# Patient Record
Sex: Female | Born: 1955 | Race: Black or African American | Hispanic: No | Marital: Single | Smoking: Never smoker
Health system: Southern US, Community
[De-identification: ages and names within clinical notes are randomized; demographics above are authoritative.]

## PROBLEM LIST (undated history)

## (undated) DIAGNOSIS — N6452 Nipple discharge: Secondary | ICD-10-CM

## (undated) DIAGNOSIS — H9319 Tinnitus, unspecified ear: Secondary | ICD-10-CM

## (undated) DIAGNOSIS — J45909 Unspecified asthma, uncomplicated: Secondary | ICD-10-CM

## (undated) DIAGNOSIS — L509 Urticaria, unspecified: Secondary | ICD-10-CM

## (undated) DIAGNOSIS — C50919 Malignant neoplasm of unspecified site of unspecified female breast: Secondary | ICD-10-CM

## (undated) DIAGNOSIS — E079 Disorder of thyroid, unspecified: Secondary | ICD-10-CM

## (undated) DIAGNOSIS — F329 Major depressive disorder, single episode, unspecified: Secondary | ICD-10-CM

## (undated) DIAGNOSIS — G4730 Sleep apnea, unspecified: Secondary | ICD-10-CM

## (undated) DIAGNOSIS — F32A Depression, unspecified: Secondary | ICD-10-CM

## (undated) DIAGNOSIS — F419 Anxiety disorder, unspecified: Secondary | ICD-10-CM

## (undated) DIAGNOSIS — R569 Unspecified convulsions: Secondary | ICD-10-CM

## (undated) DIAGNOSIS — E785 Hyperlipidemia, unspecified: Secondary | ICD-10-CM

## (undated) DIAGNOSIS — K219 Gastro-esophageal reflux disease without esophagitis: Secondary | ICD-10-CM

## (undated) DIAGNOSIS — R51 Headache: Secondary | ICD-10-CM

## (undated) DIAGNOSIS — H269 Unspecified cataract: Secondary | ICD-10-CM

## (undated) DIAGNOSIS — H938X1 Other specified disorders of right ear: Secondary | ICD-10-CM

## (undated) DIAGNOSIS — E039 Hypothyroidism, unspecified: Secondary | ICD-10-CM

## (undated) HISTORY — DX: Gastro-esophageal reflux disease without esophagitis: K21.9

## (undated) HISTORY — DX: Nipple discharge: N64.52

## (undated) HISTORY — DX: Disorder of thyroid, unspecified: E07.9

## (undated) HISTORY — DX: Unspecified cataract: H26.9

## (undated) HISTORY — PX: WISDOM TOOTH EXTRACTION: SHX21

## (undated) HISTORY — DX: Urticaria, unspecified: L50.9

## (undated) HISTORY — PX: DILATION AND CURETTAGE OF UTERUS: SHX78

## (undated) HISTORY — DX: Unspecified convulsions: R56.9

## (undated) HISTORY — PX: BREAST EXCISIONAL BIOPSY: SUR124

## (undated) HISTORY — DX: Headache: R51

## (undated) HISTORY — DX: Hyperlipidemia, unspecified: E78.5

---

## 1996-01-29 HISTORY — PX: TUBAL LIGATION: SHX77

## 1997-10-11 ENCOUNTER — Ambulatory Visit (HOSPITAL_COMMUNITY): Admission: RE | Admit: 1997-10-11 | Discharge: 1997-10-11 | Payer: Self-pay | Admitting: Endocrinology

## 1998-07-24 ENCOUNTER — Other Ambulatory Visit: Admission: RE | Admit: 1998-07-24 | Discharge: 1998-07-24 | Payer: Self-pay | Admitting: Obstetrics and Gynecology

## 1998-07-24 ENCOUNTER — Encounter (INDEPENDENT_AMBULATORY_CARE_PROVIDER_SITE_OTHER): Payer: Self-pay | Admitting: Specialist

## 1999-09-19 ENCOUNTER — Encounter: Admission: RE | Admit: 1999-09-19 | Discharge: 1999-09-19 | Payer: Self-pay | Admitting: Family Medicine

## 1999-09-19 ENCOUNTER — Encounter: Payer: Self-pay | Admitting: Family Medicine

## 2001-04-07 ENCOUNTER — Other Ambulatory Visit: Admission: RE | Admit: 2001-04-07 | Discharge: 2001-04-07 | Payer: Self-pay | Admitting: Obstetrics and Gynecology

## 2001-04-14 ENCOUNTER — Ambulatory Visit (HOSPITAL_COMMUNITY): Admission: RE | Admit: 2001-04-14 | Discharge: 2001-04-14 | Payer: Self-pay | Admitting: Endocrinology

## 2001-04-14 ENCOUNTER — Encounter: Payer: Self-pay | Admitting: Endocrinology

## 2003-03-15 ENCOUNTER — Encounter: Admission: RE | Admit: 2003-03-15 | Discharge: 2003-03-15 | Payer: Self-pay | Admitting: Family Medicine

## 2005-01-28 HISTORY — PX: ANTERIOR AND POSTERIOR REPAIR: SHX1172

## 2005-09-04 ENCOUNTER — Ambulatory Visit (HOSPITAL_COMMUNITY): Admission: RE | Admit: 2005-09-04 | Discharge: 2005-09-04 | Payer: Self-pay | Admitting: Endocrinology

## 2005-10-17 ENCOUNTER — Encounter: Admission: RE | Admit: 2005-10-17 | Discharge: 2005-10-17 | Payer: Self-pay | Admitting: Endocrinology

## 2005-10-17 ENCOUNTER — Other Ambulatory Visit: Admission: RE | Admit: 2005-10-17 | Discharge: 2005-10-17 | Payer: Self-pay | Admitting: Interventional Radiology

## 2005-10-17 ENCOUNTER — Encounter (INDEPENDENT_AMBULATORY_CARE_PROVIDER_SITE_OTHER): Payer: Self-pay | Admitting: Specialist

## 2005-11-29 ENCOUNTER — Other Ambulatory Visit: Admission: RE | Admit: 2005-11-29 | Discharge: 2005-11-29 | Payer: Self-pay | Admitting: Obstetrics and Gynecology

## 2006-09-17 ENCOUNTER — Ambulatory Visit (HOSPITAL_COMMUNITY): Admission: RE | Admit: 2006-09-17 | Discharge: 2006-09-17 | Payer: Self-pay | Admitting: Endocrinology

## 2007-07-21 ENCOUNTER — Ambulatory Visit (HOSPITAL_COMMUNITY): Admission: RE | Admit: 2007-07-21 | Discharge: 2007-07-22 | Payer: Self-pay | Admitting: Obstetrics and Gynecology

## 2008-01-29 HISTORY — PX: TYMPANOSTOMY TUBE PLACEMENT: SHX32

## 2008-05-03 ENCOUNTER — Ambulatory Visit (HOSPITAL_COMMUNITY): Admission: RE | Admit: 2008-05-03 | Discharge: 2008-05-03 | Payer: Self-pay | Admitting: Obstetrics and Gynecology

## 2010-02-18 ENCOUNTER — Encounter: Payer: Self-pay | Admitting: Endocrinology

## 2010-06-12 NOTE — Op Note (Signed)
Angela [name], Angela [name]               ACCOUNT NO.:  [id number]   MEDICAL RECORD NO.:  [id number]          PATIENT TYPE:  AMB   LOCATION:  SDC                           FACILITY:  [facility]   PHYSICIAN:  Janine Limbo, M.D.DATE OF BIRTH:  1956-01-19   DATE OF PROCEDURE:  07/21/2007  DATE OF DISCHARGE:                               OPERATIVE REPORT   PREOPERATIVE DIAGNOSES:  1. 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  2. Pelvic pressure symptoms.  3. Stress urinary incontinence.   POSTOPERATIVE DIAGNOSES:  1. 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  2. Pelvic pressure symptoms.  3. Stress urinary incontinence.   PROCEDURE:  1. Anterior and posterior colporrhaphy.  2. Tension-free vaginal tape using the TVT secure system.  3. Cystoscopy.   SURGEON:  Janine Limbo, MD   FIRST ASSISTANT:  Hal Morales, MD   ANESTHESIA:  General.   DISPOSITION:  Angela [name] is a 55 year old female, para 4-0-1-4, who  presents with the above-mentioned diagnosis.  The patient declined a  hysterectomy.  She understands the indications for her surgical  procedure as well as the alternative treatment options.  She accepts the  risks, but not limited to anesthetic complications, bleeding, infection,  and possible damage to the surrounding organs.  She understands that  there is a risk of mesh erosion and that we will use vaginal estrogen  with hopes of decreasing that risk.  She also understands that there is  a risk of urinary retention.   FINDINGS:  The patient's uterus was upper limits and normal size.  No  adnexal masses were appreciated.  The patient was noted to have a large  cystocele and rectocele with loss of the urethrovesical angle.  Cystoscopy was performed at the end of our procedure and there was no  evidence of damage to the internal bladder mucosa.  Dye easily pass  through both  ureteral orifices.   PROCEDURE IN DETAIL:  The patient was taken to the operating room where  a general anesthetic was given.  The patient's abdomen, perineum, and  vagina were prepped with multiple layers of Betadine.  The patient was  then sterilely draped.  Examination under anesthesia was performed.  A  Foley catheter was placed in the bladder.  The anterior vagina was  injected with a diluted solution of Pitressin, saline, and 0.25%  Marcaine.  The vaginal mucosa was incised.  The underlying fascia was  then separated from the vaginal mucosa to the level of the  urethrovesical angle.  The fascia and the supporting tissue of the  bladder were then reapproximated in the midline using interrupted  sutures of 2-0 chromic and 0 Vicryl.  The urethrovesical angle was  reinforced using 0 Vicryl.  The excess vaginal mucosa was excised.  The  vaginal mucosa was then closed using interrupted sutures of 2-0 Vicryl.  Hemostasis was noted to be adequate.  We then turned our attention to  the posterior colporrhaphy.  The perineal  body and the vaginal mucosa  were then injected in the midline using a diluted solution of Pitressin,  saline, and 0.25% Marcaine.  An incision was made in the perineal body.  The midline of the vagina was then separated from the underlying  supporting tissue of the posterior vagina.  The fascia and the  supporting tissue of the posterior vagina were then reapproximated in  the midline using interrupted sutures of 0 Vicryl, and 2-0 chromic  catgut.  The excess vaginal mucosa was excised.  The vaginal mucosa was  then reapproximated in the midline using a running suture of 2-0 Vicryl.  The perineal body was reinforced using interrupted sutures of 0 Vicryl.  The skin of the perineum was reapproximated in the midline using 2-0  Vicryl.  Sponge, needle, and instrument counts were correct at this  point.  We then turned our attention to the tension free vaginal tape  portion  of the procedure.  We measured 1-2 cm from the ureteral orifice  on the anterior vagina.  We then made a 1.5 cm incision after injecting  the area with Pitressin, saline, and 0.25% Marcaine.  The underlying  vaginal mucosa was then separated from the vagina.  We then inserted the  TVT secure instrument and angled the device in a hammock fashion toward  the ischiopubic ramus.  We tucked the device behind the infeior edge of  the pubic ramus into the obturator internus muscle.  We began on the  patient's right side and an identical procedure was carried out on the  patient's left side.  We checked the tension of the mesh and it was  thought to be appropriate.  The locking springs were released and the  TVT applicators were removed.  Again, the mesh was noted to be in an  appropriate position with appropriate tension.  We then closed the  vaginal mucosa using 2-0 Vicryl and 2-0 chromic.  Sponge, needle, and  instrument counts were noted to be correct.  The Foley catheter was  removed and the cystoscope was inserted in the bladder after reprepping  the urethra.  The patient was given an ampule of indigo carmine dye.  The bladder mucosa was noted to be without harm and no pathology was  noted.  Dye very quickly passed through the normal appearing ureteral  orifices.  Pictures were taken.  The cystoscope was removed.  The  urethra was prepped once again and the Foley catheter was reinserted.  The vagina was then packed with 1/2-inch and 1-inch gauze that was  soaked with estrogen vaginal cream.  The patient was awakened from her  anesthetic without difficulty and then taken to the recovery room in  stable condition.  The estimated blood loss was between 50 and 100 mL.  Sponge, needle, and instrument counts were correct.  The patient  tolerated her procedure very well.  There were no pathology specimens.      Janine Limbo, M.D.  Electronically Signed     AVS/MEDQ  D:  07/21/2007  T:   07/22/2007  Job:  [id number]

## 2010-06-12 NOTE — H&P (Signed)
Angela [name], Angela [name]               ACCOUNT NO.:  [id number]   MEDICAL RECORD NO.:  [id number]          PATIENT TYPE:  AMB   LOCATION:                                FACILITY:  [facility]   PHYSICIAN:  Janine Limbo, M.D.DATE OF BIRTH:  [birth year]   DATE OF ADMISSION:  07/21/2007  DATE OF DISCHARGE:                              HISTORY & PHYSICAL   DATE OF SURGERY:  July 21, 2007.   HISTORY OF PRESENT ILLNESS:  Ms. Trefry is a 55 year old female, para 4-  0-1-4, who presents for an anterior and posterior colporrhaphy.  The  patient has been followed at the [facility]  Gy[facility]cology division of [facility].  The patient  complains of pelvic pressure symptoms like something is falling out.  She is noted to have a cystocele and a rectocele with loss of the  urethrovesical angle.  Her most recent Pap smear is within normal  limits.   OBSTETRICAL HISTORY:  The patient has had 4 term vaginal deliveries and  1 first trimester miscarriage.   PAST MEDICAL HISTORY:  The patient has a history of thyroid disease.  She also has a history of asthma.  She had her wisdom teeth removed.  She had a D&C associated with her miscarriage.  She had a seizure  disorder as a child, but has not had seizures since age 7.   DRUG ALLERGIES:  No known drug allergies.   SOCIAL HISTORY:  The patient denies cigarette use and recreational drug  use.  She does drink alcohol socially.  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   CURRENT MEDICATIONS:  Multivitamins, Synthroid, Alavert, Nasonex,  albuterol inhaler, and Asmanex.   REVIEW OF SYSTEMS:  Please see history of present illness.   FAMILY HISTORY:  Noncontributory.   PHYSICAL EXAMINATION:  VITAL SIGNS:  Weight is 216 pounds.  Height is 5  feet 5 inches.  HEENT:  Within normal limits.  CHEST:  Clear.  HEART:  Regular rate and rhythm.  BREASTS:  Without masses.  ABDOMEN:  Nontender.  EXTREMITIES:  Grossly normal.  NEUROLOGIC:  Grossly normal.  PELVIC:  External genitalia is normal.  Vagina shows a cystocele and a  rectocele with loss of urethrovesical angle.  The cervix is nontender.  The uterus is upper-limit normal size and normal in contour.  Adnexa, no  masses.  Rectovaginal exam confirms.   ASSESSMENT:  Pelvic relaxation with pelvic pressure symptoms.   PLAN:  The patient has elected to proceed with an anterior and posterior  colporrhaphies.  I discussed the benefits of a hysterectomy as well as  tension-free vaginal tape placement.  The patient understands the  benefits and risks associated with the alternative procedures.  She  declines hysterectomy and TVT at this time.  She wishes to proceed only  with an anterior and posterior colporrhaphy.  Specifically, speaking,  the patient understands and accepts the risk of anesthetic  complications, bleeding, infections, and possible damage to the  surrounding organs.      Janine Limbo, M.D.  Electronically Signed     AVS/MEDQ  D:  07/14/2007  T:  07/15/2007  Job:  [id number]

## 2010-06-15 NOTE — Discharge Summary (Signed)
[name], [name]               ACCOUNT NO.:  [id number]   MEDICAL RECORD NO.:  [id number]          PATIENT TYPE:  OIB   LOCATION:  9310                          FACILITY:  [facility]   PHYSICIAN:  Janine Limbo, M.D.DATE OF BIRTH:  Dec 05, 1955   DATE OF ADMISSION:  07/21/2007  DATE OF DISCHARGE:  07/22/2007                               DISCHARGE SUMMARY   ADMISSION DIAGNOSES:  1. Pelvic relaxation with a cystocele, rectocele, and loss of the      urethrovesical angle.  2. Pelvic pressure symptoms.  3. Stress urinary incontinence.  4. Obesity (weight is 216 pounds, height 5 feet 5 inches).  5. Hypothyroidism.  6. Asthma.   POSTOPERATIVE DIAGNOSES:  1. Pelvic relaxation with a cystocele, rectocele, and loss of the      urethrovesical angle.  2. Pelvic pressure symptoms.  3. Stress urinary incontinence.  4. Obesity (weight is 216 pounds, height 5 feet 5 inches).  5. Hypothyroidism.  6. Asthma.   PROCEDURES THIS ADMISSION:  1. Anterior and posterior colporrhaphies.  2. Placement of a tension-free vaginal tape using the TVT secure      system.  3. Cystoscopy.   HISTORY OF PRESENT ILLNESS:  Angela [name] is a 55 year old female, para 4-  0-1-4, who presents with the above-mentioned diagnoses.  She wishes to  have surgical management.  Please see her dictated history and physical  exam for details.   ADMISSION PHYSICAL EXAM:  The patient was noted to have an upper limits  normal size uterus.  She was noted to have a large cystocele and  rectocele with loss of the urethrovesical angle.  No enterocele was  present.   HOSPITAL COURSE:  On the day of admission, the patient was taken to the  operating room.  An anterior and posterior colporrhaphy was performed  without difficulty.  We then performed a TVT secure procedure using the  Hammock technique.  The patient tolerated her procedure well.  Operative  findings included a large cystocele and rectocele with loss of the  urethrovesical angle.  We performed cystoscopy and there was no evidence  of damage to the bladder. The patient's postoperative course was  uncomplicated.  She quickly tolerated a regular diet.  She remained  afebrile throughout her postoperative stay.  Her postoperative  hemoglobin was 11.6 (preoperative hemoglobin 14.1).  The patient had her  Foley catheter removed and she was able to void without difficulty.  She  was discharged to home doing quite well.   DISCHARGE MEDICATIONS:  1. Tylenol 1 or 2 every 6 hours as needed for mild-to-moderate pain.  2. Tylenol No. 3, 1 or 2 tablets every 4 hours as needed for severe      pain.  3. Zofran 1 tablet every 4 hours as needed for nausea.  4. Premarin vaginal cream 1/4 applicator per vagina each night for 2      weeks then every other night for 4 weeks.  5. Iron 325 mg 1 tablet twice each day for 6 weeks.   The patient will continue her preoperative medications.   DISCHARGE INSTRUCTIONS:  1.  The patient will return to see Dr. Stefano Gaul in 6 weeks.  2. She will refrain from driving for more than 2 weeks, heavy lifting      for 4 weeks, and intercourse for 6 weeks.  3. She will call for questions or concerns.  4. She will call for any difficulty passing her urine.   PATHOLOGY REPORT:  No specimens were sent for pathology.      Janine Limbo, M.D.  Electronically Signed     AVS/MEDQ  D:  07/23/2007  T:  07/24/2007  Job:  [id number]

## 2010-09-14 ENCOUNTER — Other Ambulatory Visit: Payer: Self-pay | Admitting: Obstetrics and Gynecology

## 2010-09-14 DIAGNOSIS — N6452 Nipple discharge: Secondary | ICD-10-CM

## 2010-09-20 ENCOUNTER — Ambulatory Visit
Admission: RE | Admit: 2010-09-20 | Discharge: 2010-09-20 | Disposition: A | Discharge status: Home / Self Care | Payer: 59 | Source: Ambulatory Visit | Attending: Obstetrics and Gynecology | Admitting: Obstetrics and Gynecology

## 2010-09-20 DIAGNOSIS — N6452 Nipple discharge: Secondary | ICD-10-CM

## 2010-10-25 LAB — URINALYSIS, ROUTINE W REFLEX MICROSCOPIC
Bilirubin Urine: NEGATIVE
Glucose, UA: NEGATIVE
Hgb urine dipstick: NEGATIVE
Ketones, ur: NEGATIVE
Nitrite: NEGATIVE
Protein, ur: NEGATIVE
Specific Gravity, Urine: 1.02
Urobilinogen, UA: 0.2
pH: 5

## 2010-10-25 LAB — BASIC METABOLIC PANEL
BUN: 7
CO2: 25
Calcium: 8.5
Chloride: 106
Creatinine, Ser: 0.82
GFR calc Af Amer: >60
GFR calc non Af Amer: >60
Glucose, Bld: 92
Potassium: 3.4 — ABNORMAL LOW
Sodium: 140

## 2010-10-25 LAB — CBC
HCT: 35.1 — ABNORMAL LOW
HCT: 42.9
Hemoglobin: 11.6 — ABNORMAL LOW
Hemoglobin: 14.1
MCHC: 32.9
MCHC: 33.1
MCV: 77.2 — ABNORMAL LOW
MCV: 77.5 — ABNORMAL LOW
Platelets: 208
Platelets: 257
RBC: 4.53
RBC: 5.55 — ABNORMAL HIGH
RDW: 15.4
RDW: 15.6 — ABNORMAL HIGH
WBC: 7.3
WBC: 8.5

## 2010-10-25 LAB — NO BLOOD PRODUCTS

## 2010-11-06 ENCOUNTER — Encounter (INDEPENDENT_AMBULATORY_CARE_PROVIDER_SITE_OTHER): Payer: Self-pay | Admitting: General Surgery

## 2010-11-06 ENCOUNTER — Encounter (INDEPENDENT_AMBULATORY_CARE_PROVIDER_SITE_OTHER): Payer: Self-pay

## 2010-11-06 ENCOUNTER — Ambulatory Visit (INDEPENDENT_AMBULATORY_CARE_PROVIDER_SITE_OTHER): Payer: 59 | Admitting: General Surgery

## 2010-11-06 VITALS — BP 132/96 | HR 60 | Temp 98.0°F | Resp 12 | Ht 64.0 in | Wt 219.6 lb

## 2010-11-06 DIAGNOSIS — N6452 Nipple discharge: Secondary | ICD-10-CM | POA: Insufficient documentation

## 2010-11-06 DIAGNOSIS — N6459 Other signs and symptoms in breast: Secondary | ICD-10-CM

## 2010-11-06 NOTE — Progress Notes (Signed)
Chief Complaint  Patient presents with  . Other    new pt- eval rt nipple discharge    HPI Angela Angela [name] is a 55 y.Angela [name]. female.     HPI  The patient comes in with a chief complaint of discharge and itching in her right breast. She had a recent mammogram which did not disclose any underlying malignancy. She continues to have problems of discharge and itching and she comes in now for further evaluation.  She does not have a strong family history of any type of breast disease or breast cancer although she had a maternal aunt who was diagnosed and died of breast  Past Medical History  Diagnosis Date  . Breast discharge   . Asthma   . Hyperlipidemia     Past Surgical History  Procedure Date  . Anterior and posterior repair 2007  . Tubal ligation 1998    Family History  Problem Relation Age of Onset  . Cancer Father     throat  . Cancer Maternal Aunt     breast    Social History History  Substance Use Topics  . Smoking status: Never Smoker   . Smokeless tobacco: Not on file  . Alcohol Use: No    Allergies  Allergen Reactions  . Aspirin   . Ibuprofen     Current Outpatient Prescriptions  Medication Sig Dispense Refill  . cetirizine (ZYRTEC) 10 MG tablet Take 10 mg by mouth daily.        . clobetasol (TEMOVATE) 0.05 % cream Ad lib.      Marland Kitchen LEVOXYL 112 MCG tablet daily.      . mometasone (ASMANEX) 220 MCG/INH inhaler Inhale 2 puffs into the lungs daily.        . Olopatadine HCl (PATANASE) 0.6 % SOLN Place into the nose daily.          Review of Systems Review of Systems  Constitutional: Negative.   HENT: Negative.   Respiratory: Negative.   Cardiovascular: Negative.   Gastrointestinal: Negative.     Blood pressure 132/96, pulse 60, temperature 98 F (36.7 C), temperature source Temporal, resp. rate 12, height 5\' 4"  (1.626 m), weight 219 lb 9.6 oz (99.61 kg).  Physical Exam Physical Exam  Constitutional: She appears well-developed and well-nourished.    HENT:  Head: Normocephalic and atraumatic.  Eyes: EOM are normal. Pupils are equal, round, and reactive to light.  Neck: Normal range of motion. Neck supple.  Pulmonary/Chest: She exhibits no mass. Right breast exhibits inverted nipple (right nipple only), nipple discharge and skin change (nipple only). Right breast exhibits no mass and no tenderness. Left breast exhibits no inverted nipple, no mass, no nipple discharge, no skin change and no tenderness. Breast asymmetry: slightly large rleft breast.      Data Reviewed The results from the patient's mammogram and apparent cytology are on the chart. There are atypical cells from the cytology.  Assessment    Atypical cells from an excoriated and indented right nipple with chronic discharge the possibility of Paget's disease of the breast has to be entertained. This is in spite of the patient having a normal mammogram.   Plan    The plan is to get a MRI of the right breast rule out an occult malignancy. Once the results back we will likely need to go ahead and do a biopsy anyway       Angela Angela [name],Angela Angela [name] 11/06/2010, 2:42 PM

## 2010-11-07 ENCOUNTER — Other Ambulatory Visit (INDEPENDENT_AMBULATORY_CARE_PROVIDER_SITE_OTHER): Payer: Self-pay

## 2010-11-07 DIAGNOSIS — N6452 Nipple discharge: Secondary | ICD-10-CM

## 2010-11-07 DIAGNOSIS — N6459 Other signs and symptoms in breast: Secondary | ICD-10-CM

## 2010-11-16 ENCOUNTER — Other Ambulatory Visit: Payer: 59

## 2010-12-04 ENCOUNTER — Encounter (INDEPENDENT_AMBULATORY_CARE_PROVIDER_SITE_OTHER): Payer: 59 | Admitting: General Surgery

## 2010-12-18 ENCOUNTER — Ambulatory Visit (INDEPENDENT_AMBULATORY_CARE_PROVIDER_SITE_OTHER): Payer: 59 | Admitting: General Surgery

## 2010-12-18 ENCOUNTER — Encounter (INDEPENDENT_AMBULATORY_CARE_PROVIDER_SITE_OTHER): Payer: Self-pay | Admitting: General Surgery

## 2010-12-18 VITALS — BP 148/104 | HR 68 | Temp 97.4°F | Resp 16 | Ht 64.0 in | Wt 221.6 lb

## 2010-12-18 DIAGNOSIS — N6459 Other signs and symptoms in breast: Secondary | ICD-10-CM

## 2010-12-18 DIAGNOSIS — N6452 Nipple discharge: Secondary | ICD-10-CM | POA: Insufficient documentation

## 2010-12-18 NOTE — Progress Notes (Signed)
HPI The patient continues to have drainage from her right nipple. It is intermittent but still present and there is itching.  PE On exam exam she still has some changes in the right nipple that could be indicative of Paget's disease  Studiy review There are no current studies to review.  Assessment Because the patient has continued drainage from her right nipple and there were atypical cells noted on the fluid drained by Dr. Stefano Gaul I do believe that an MRI of the breast as indicated . I am making an attempt to contact the insurance approval agency so that we may get the MRI approved for the patient. This is not done them perhaps an open biopsy of the nipple will be necessary.  Plan Contact insurance or latency in order to seat approval for an MRI of the right breast. With the results from that and perhaps we may or may not require biopsy of her right breast  Addendum  After talking with the patient and I was able to contact the insurance approval agency and they went ahead and improved with MRI of the right breast. We will go ahead and get this scheduled and I will see the patient again in the future to discuss with her the results.

## 2011-01-03 ENCOUNTER — Other Ambulatory Visit: Payer: 59

## 2011-01-04 ENCOUNTER — Ambulatory Visit
Admission: RE | Admit: 2011-01-04 | Discharge: 2011-01-04 | Disposition: A | Discharge status: Home / Self Care | Payer: 59 | Source: Ambulatory Visit | Attending: General Surgery | Admitting: General Surgery

## 2011-01-04 DIAGNOSIS — N6452 Nipple discharge: Secondary | ICD-10-CM

## 2011-01-04 DIAGNOSIS — N6459 Other signs and symptoms in breast: Secondary | ICD-10-CM

## 2011-01-04 MED ORDER — GADOBENATE DIMEGLUMINE 529 MG/ML IV SOLN
20.0000 mL | Freq: Once | INTRAVENOUS | Status: AC | PRN
  Administered 2011-01-04: 20 mL via INTRAVENOUS

## 2011-01-07 ENCOUNTER — Other Ambulatory Visit (INDEPENDENT_AMBULATORY_CARE_PROVIDER_SITE_OTHER): Payer: Self-pay | Admitting: General Surgery

## 2011-01-07 DIAGNOSIS — R928 Other abnormal and inconclusive findings on diagnostic imaging of breast: Secondary | ICD-10-CM

## 2011-01-08 ENCOUNTER — Ambulatory Visit
Admission: RE | Admit: 2011-01-08 | Discharge: 2011-01-08 | Disposition: A | Discharge status: Home / Self Care | Payer: 59 | Source: Ambulatory Visit | Attending: General Surgery | Admitting: General Surgery

## 2011-01-08 DIAGNOSIS — R928 Other abnormal and inconclusive findings on diagnostic imaging of breast: Secondary | ICD-10-CM

## 2011-01-15 ENCOUNTER — Ambulatory Visit (INDEPENDENT_AMBULATORY_CARE_PROVIDER_SITE_OTHER): Payer: 59 | Admitting: General Surgery

## 2011-01-15 ENCOUNTER — Encounter (INDEPENDENT_AMBULATORY_CARE_PROVIDER_SITE_OTHER): Payer: Self-pay | Admitting: General Surgery

## 2011-01-15 VITALS — BP 132/84 | HR 64 | Temp 97.3°F | Resp 18 | Ht 64.0 in | Wt 218.1 lb

## 2011-01-15 DIAGNOSIS — N6452 Nipple discharge: Secondary | ICD-10-CM

## 2011-01-15 DIAGNOSIS — N6459 Other signs and symptoms in breast: Secondary | ICD-10-CM

## 2011-01-15 NOTE — Progress Notes (Signed)
HPI The patient underwent an MRI of both her breasts. It was no significant abnormality found. She also underwent a followup ultrasound of her left breast which did not show any significant abnormality.  Drainage and the patient has had his been from the right breast and there was no abnormality demonstrated there on ultrasound and or MRI.  PE On examination the patient continues to have drainage from the right clefted nipple. Previous specimens sent by Dr. Kirkland Hun demonstrated some abnormal cells. On examination today I could not milk any fluid however based on previous several cytology and the invaginated appearance of the right nipple a biopsy is being recommended.  Studiy review I have reviewed the patient's MRI studies and her ultrasounds of her breast.  Assessment Abnormal cells from a cleft with right nipple with drainage.  Plan In spite of the melanoma were non-abnormal MRI and the ultrasound I believe that a biopsy is warranted based on the abnormal cells from her right nipple. We will go ahead and schedule her for right nipple exploration and biopsy.

## 2011-01-17 ENCOUNTER — Encounter (HOSPITAL_BASED_OUTPATIENT_CLINIC_OR_DEPARTMENT_OTHER): Payer: Self-pay | Admitting: *Deleted

## 2011-01-17 NOTE — Progress Notes (Signed)
To bring cpap-and use post op-mild osa-does not always use, but will now.

## 2011-01-18 ENCOUNTER — Ambulatory Visit (HOSPITAL_BASED_OUTPATIENT_CLINIC_OR_DEPARTMENT_OTHER): Payer: 59 | Admitting: Anesthesiology

## 2011-01-18 ENCOUNTER — Ambulatory Visit (HOSPITAL_BASED_OUTPATIENT_CLINIC_OR_DEPARTMENT_OTHER)
Admission: RE | Admit: 2011-01-18 | Discharge: 2011-01-18 | Disposition: A | Discharge status: Home / Self Care | Payer: 59 | Source: Ambulatory Visit | Attending: General Surgery | Admitting: General Surgery

## 2011-01-18 ENCOUNTER — Encounter (HOSPITAL_BASED_OUTPATIENT_CLINIC_OR_DEPARTMENT_OTHER)
Admission: RE | Disposition: A | Discharge status: Home / Self Care | Payer: Self-pay | Source: Ambulatory Visit | Attending: General Surgery

## 2011-01-18 ENCOUNTER — Encounter (HOSPITAL_BASED_OUTPATIENT_CLINIC_OR_DEPARTMENT_OTHER): Payer: Self-pay | Admitting: *Deleted

## 2011-01-18 ENCOUNTER — Encounter (HOSPITAL_BASED_OUTPATIENT_CLINIC_OR_DEPARTMENT_OTHER): Payer: Self-pay | Admitting: Anesthesiology

## 2011-01-18 ENCOUNTER — Other Ambulatory Visit (INDEPENDENT_AMBULATORY_CARE_PROVIDER_SITE_OTHER): Payer: Self-pay | Admitting: General Surgery

## 2011-01-18 ENCOUNTER — Encounter (HOSPITAL_BASED_OUTPATIENT_CLINIC_OR_DEPARTMENT_OTHER): Payer: Self-pay | Admitting: Certified Registered"

## 2011-01-18 DIAGNOSIS — J45909 Unspecified asthma, uncomplicated: Secondary | ICD-10-CM | POA: Insufficient documentation

## 2011-01-18 DIAGNOSIS — N6452 Nipple discharge: Secondary | ICD-10-CM

## 2011-01-18 DIAGNOSIS — Z01812 Encounter for preprocedural laboratory examination: Secondary | ICD-10-CM | POA: Insufficient documentation

## 2011-01-18 DIAGNOSIS — N6049 Mammary duct ectasia of unspecified breast: Secondary | ICD-10-CM | POA: Insufficient documentation

## 2011-01-18 DIAGNOSIS — N6459 Other signs and symptoms in breast: Secondary | ICD-10-CM

## 2011-01-18 HISTORY — PX: BREAST SURGERY: SHX581

## 2011-01-18 HISTORY — PX: BREAST DUCTAL SYSTEM EXCISION: SHX5242

## 2011-01-18 LAB — POCT HEMOGLOBIN-HEMACUE: Hemoglobin: 13.5 g/dL (ref 12.0–15.0)

## 2011-01-18 SURGERY — EXCISION DUCTAL SYSTEM BREAST
Anesthesia: Monitor Anesthesia Care | Site: Breast | Laterality: Right | Wound class: Clean

## 2011-01-18 MED ORDER — MIDAZOLAM HCL 5 MG/5ML IJ SOLN
INTRAMUSCULAR | Status: DC | PRN
  Administered 2011-01-18: 2 mg via INTRAVENOUS

## 2011-01-18 MED ORDER — FENTANYL CITRATE 0.05 MG/ML IJ SOLN
INTRAMUSCULAR | Status: DC | PRN
  Administered 2011-01-18: 100 ug via INTRAVENOUS

## 2011-01-18 MED ORDER — LIDOCAINE HCL (CARDIAC) 20 MG/ML IV SOLN
INTRAVENOUS | Status: DC | PRN
  Administered 2011-01-18: 45 mg via INTRAVENOUS

## 2011-01-18 MED ORDER — PROMETHAZINE HCL 25 MG/ML IJ SOLN: 6.2500 mg | INTRAMUSCULAR | Status: DC | PRN

## 2011-01-18 MED ORDER — LACTATED RINGERS IV SOLN
INTRAVENOUS | Status: DC
  Administered 2011-01-18: 08:00:00 via INTRAVENOUS

## 2011-01-18 MED ORDER — PROPOFOL 10 MG/ML IV EMUL
INTRAVENOUS | Status: DC | PRN
  Administered 2011-01-18: 40 ug/kg/min via INTRAVENOUS

## 2011-01-18 MED ORDER — CEFAZOLIN SODIUM 1-5 GM-% IV SOLN: 1.0000 g | INTRAVENOUS | Status: DC

## 2011-01-18 MED ORDER — LIDOCAINE HCL (PF) 1 % IJ SOLN
INTRAMUSCULAR | Status: DC | PRN
  Administered 2011-01-18: 10 mL

## 2011-01-18 MED ORDER — HYDROCODONE-ACETAMINOPHEN 5-500 MG PO TABS: 1.0000 | ORAL_TABLET | Freq: Four times a day (QID) | ORAL | Status: AC | PRN

## 2011-01-18 MED ORDER — CEFAZOLIN SODIUM-DEXTROSE 2-3 GM-% IV SOLR
2.0000 g | INTRAVENOUS | Status: AC
  Administered 2011-01-18: 2 g via INTRAVENOUS

## 2011-01-18 MED ORDER — ONDANSETRON HCL 4 MG/2ML IJ SOLN
INTRAMUSCULAR | Status: DC | PRN
  Administered 2011-01-18: 4 mg via INTRAVENOUS

## 2011-01-18 MED ORDER — FENTANYL CITRATE 0.05 MG/ML IJ SOLN: 25.0000 ug | INTRAMUSCULAR | Status: DC | PRN

## 2011-01-18 MED ORDER — MEPERIDINE HCL 25 MG/ML IJ SOLN
6.2500 mg | INTRAMUSCULAR | Status: DC | PRN
Start: 2011-01-18 — End: 2011-01-18

## 2011-01-18 SURGICAL SUPPLY — 52 items
BLADE SURG 10 STRL SS (BLADE) ×2 IMPLANT
BLADE SURG 15 STRL LF DISP TIS (BLADE) ×1 IMPLANT
BLADE SURG 15 STRL SS (BLADE) ×1
CANISTER SUCTION 1200CC (MISCELLANEOUS) IMPLANT
CHLORAPREP W/TINT 26ML (MISCELLANEOUS) ×2 IMPLANT
CLEANER CAUTERY TIP 5X5 PAD (MISCELLANEOUS) ×2 IMPLANT
CLIP TI MEDIUM 6 (CLIP) IMPLANT
CLOTH BEACON ORANGE TIMEOUT ST (SAFETY) ×2 IMPLANT
COVER MAYO STAND STRL (DRAPES) ×2 IMPLANT
COVER TABLE BACK 60X90 (DRAPES) ×2 IMPLANT
DECANTER SPIKE VIAL GLASS SM (MISCELLANEOUS) IMPLANT
DERMABOND ADVANCED (GAUZE/BANDAGES/DRESSINGS) ×1
DERMABOND ADVANCED .7 DNX12 (GAUZE/BANDAGES/DRESSINGS) ×1 IMPLANT
DEVICE DUBIN W/COMP PLATE 8390 (MISCELLANEOUS) IMPLANT
DRAPE LAPAROTOMY TRNSV 102X78 (DRAPE) IMPLANT
DRAPE PED LAPAROTOMY (DRAPES) ×2 IMPLANT
DRAPE UTILITY 15X26 W/TAPE STR (DRAPE) ×2 IMPLANT
DRSG TEGADERM 4X4.75 (GAUZE/BANDAGES/DRESSINGS) ×2 IMPLANT
ELECT REM PT RETURN 9FT ADLT (ELECTROSURGICAL) ×2
ELECTRODE REM PT RTRN 9FT ADLT (ELECTROSURGICAL) ×1 IMPLANT
GAUZE XEROFORM 1X8 LF (GAUZE/BANDAGES/DRESSINGS) ×2 IMPLANT
GLOVE BIO SURGEON STRL SZ7 (GLOVE) ×2 IMPLANT
GLOVE BIOGEL PI IND STRL 8 (GLOVE) ×1 IMPLANT
GLOVE BIOGEL PI INDICATOR 8 (GLOVE) ×1
GLOVE ECLIPSE 7.5 STRL STRAW (GLOVE) ×4 IMPLANT
GOWN PREVENTION PLUS XLARGE (GOWN DISPOSABLE) ×4 IMPLANT
GOWN PREVENTION PLUS XXLARGE (GOWN DISPOSABLE) ×2 IMPLANT
NEEDLE 27GAX1X1/2 (NEEDLE) ×2 IMPLANT
NEEDLE HYPO 22GX1.5 SAFETY (NEEDLE) IMPLANT
NS IRRIG 1000ML POUR BTL (IV SOLUTION) ×2 IMPLANT
PACK BASIN DAY SURGERY FS (CUSTOM PROCEDURE TRAY) ×2 IMPLANT
PAD CLEANER CAUTERY TIP 5X5 (MISCELLANEOUS) ×2
PENCIL BUTTON HOLSTER BLD 10FT (ELECTRODE) ×2 IMPLANT
SLEEVE SCD COMPRESS KNEE MED (MISCELLANEOUS) IMPLANT
SPONGE LAP 4X18 X RAY DECT (DISPOSABLE) ×2 IMPLANT
STRIP CLOSURE SKIN 1/4X4 (GAUZE/BANDAGES/DRESSINGS) IMPLANT
SUCTION FRAZIER TIP 10 FR DISP (SUCTIONS) IMPLANT
SUT MON AB 4-0 PC3 18 (SUTURE) IMPLANT
SUT MON AB 5-0 PS2 18 (SUTURE) ×2 IMPLANT
SUT PROLENE 4 0 PS 2 18 (SUTURE) IMPLANT
SUT VIC AB 3-0 54X BRD REEL (SUTURE) IMPLANT
SUT VIC AB 3-0 BRD 54 (SUTURE)
SUT VIC AB 3-0 SH 27 (SUTURE)
SUT VIC AB 3-0 SH 27X BRD (SUTURE) IMPLANT
SUT VIC AB 4-0 SH 27 (SUTURE) ×1
SUT VIC AB 4-0 SH 27XANBCTRL (SUTURE) ×1 IMPLANT
SYR BULB 3OZ (MISCELLANEOUS) IMPLANT
SYR CONTROL 10ML LL (SYRINGE) ×2 IMPLANT
TOWEL OR 17X24 6PK STRL BLUE (TOWEL DISPOSABLE) ×2 IMPLANT
TUBE CONNECTING 20X1/4 (TUBING) IMPLANT
WATER STERILE IRR 1000ML POUR (IV SOLUTION) IMPLANT
YANKAUER SUCT BULB TIP NO VENT (SUCTIONS) IMPLANT

## 2011-01-18 NOTE — Anesthesia Preprocedure Evaluation (Signed)
Anesthesia Evaluation  Patient identified by MRN, date of birth, ID band Patient awake    Reviewed: Allergy & Precautions, H&P , NPO status , Patient's Chart, lab work & pertinent test results  Airway Mallampati: II TM Distance: >3 FB Neck ROM: Full    Dental No notable dental hx. (+) Teeth Intact   Pulmonary asthma ,  clear to auscultation  Pulmonary exam normal       Cardiovascular neg cardio ROS Regular Normal    Neuro/Psych Negative Neurological ROS  Negative Psych ROS   GI/Hepatic Neg liver ROS, Controlled,  Endo/Other  Negative Endocrine ROS  Renal/GU negative Renal ROS  Genitourinary negative   Musculoskeletal   Abdominal (+) obese,   Peds  Hematology negative hematology ROS (+)   Anesthesia Other Findings   Reproductive/Obstetrics negative OB ROS                           Anesthesia Physical Anesthesia Plan  ASA: II  Anesthesia Plan: MAC   Post-op Pain Management:    Induction: Intravenous  Airway Management Planned: Simple Face Mask  Additional Equipment:   Intra-op Plan:   Post-operative Plan:   Informed Consent: I have reviewed the patients History and Physical, chart, labs and discussed the procedure including the risks, benefits and alternatives for the proposed anesthesia with the patient or authorized representative who has indicated his/her understanding and acceptance.     Plan Discussed with: CRNA and Surgeon  Anesthesia Plan Comments:         Anesthesia Quick Evaluation

## 2011-01-18 NOTE — Anesthesia Postprocedure Evaluation (Signed)
  Anesthesia Post-op Note  Patient: Angela [name]  Procedure(s) Performed:  EXCISION DUCTAL SYSTEM BREAST  Patient Location: PACU  Anesthesia Type: MAC  Level of Consciousness: awake and alert   Airway and Oxygen Therapy: Patient Spontanous Breathing  Post-op Pain: mild  Post-op Assessment: Post-op Vital signs reviewed, Patient's Cardiovascular Status Stable and Respiratory Function Stable  Post-op Vital Signs: Reviewed and stable  Complications: No apparent anesthesia complications

## 2011-01-18 NOTE — Anesthesia Procedure Notes (Addendum)
Procedure Name: MAC Date/Time: 01/18/2011 9:13 AM Performed by: Radford Pax Pre-anesthesia Checklist: Patient identified, Timeout performed, Emergency Drugs available, Suction available and Patient being monitored Patient Re-evaluated:Patient Re-evaluated prior to inductionOxygen Delivery Method: Simple face mask   Date/Time: 01/18/2011 9:13 AM Performed by: Radford Pax Placement Confirmation: positive ETCO2 and CO2 detector

## 2011-01-18 NOTE — H&P (Signed)
HISTORY AND PHYSICAL EXAMINATION  Chief Complaint   Patient presents with   .  Other     new pt- eval rt nipple discharge   HPI  Angela [name] is a 55 y.o. female.  HPI  The patient comes in with a chief complaint of discharge and itching in her right breast. She had a recent mammogram which did not disclose any underlying malignancy. She continues to have problems of discharge and itching and she comes in now for further evaluation.  She does not have a strong family history of any type of breast disease or breast cancer although she had a maternal aunt who was diagnosed and died of breast  Past Medical History   Diagnosis  Date   .  Breast discharge    .  Asthma    .  Hyperlipidemia     Past Surgical History   Procedure  Date   .  Anterior and posterior repair  2007   .  Tubal ligation  1998    Family History   Problem  Relation  Age of Onset   .  Cancer  Father       throat    .  Cancer  Maternal Aunt       breast   Social History  History   Substance Use Topics   .  Smoking status:  Never Smoker   .  Smokeless tobacco:  Not on file   .  Alcohol Use:  No    Allergies   Allergen  Reactions   .  Aspirin    .  Ibuprofen     Current Outpatient Prescriptions   Medication  Sig  Dispense  Refill   .  cetirizine (ZYRTEC) 10 MG tablet  Take 10 mg by mouth daily.     .  clobetasol (TEMOVATE) 0.05 % cream  Ad lib.     Marland Kitchen  LEVOXYL 112 MCG tablet  daily.     .  mometasone (ASMANEX) 220 MCG/INH inhaler  Inhale 2 puffs into the lungs daily.     .  Olopatadine HCl (PATANASE) 0.6 % SOLN  Place into the nose daily.     Review of Systems  Review of Systems  Constitutional: Negative.  HENT: Negative.  Respiratory: Negative.  Cardiovascular: Negative.  Gastrointestinal: Negative.  Blood pressure 132/96, pulse 60, temperature 98 F (36.7 C), temperature source Temporal, resp. rate 12, height 5\' 4"  (1.626 m), weight 219 lb 9.6 oz (99.61 kg).  Physical Exam  Physical Exam    Constitutional: She appears well-developed and well-nourished.  HENT:  Head: Normocephalic and atraumatic.  Eyes: EOM are normal. Pupils are equal, round, and reactive to light.  Neck: Normal range of motion. Neck supple.  Pulmonary/Chest: She exhibits no mass. Right breast exhibits inverted nipple (right nipple only), nipple discharge and skin change (nipple only). Right breast exhibits no mass and no tenderness. Left breast exhibits no inverted nipple, no mass, no nipple discharge, no skin change and no tenderness. Breast asymmetry: slightly large rleft breast.   Data Reviewed  The results from the patient's mammogram and apparent cytology are on the chart. There are atypical cells from the cytology.  Assessment  Atypical cells from an excoriated and indented right nipple with chronic discharge the possibility of Paget's disease of the breast has to be entertained. This is in spite of the patient having a normal mammogram.  Plan  The plan is to get a MRI of the right breast rule  out an occult malignancy. Once the results back we will likely need to go ahead and do a biopsy anyway  Latesa Fratto III,Keileigh Vahey O  11/06/2010, 2:42 PM

## 2011-01-18 NOTE — Op Note (Signed)
OPERATIVE REPORT  DATE OF OPERATION: 01/18/2011  PATIENT:  Angela [name]  55 y.o. female  PRE-OPERATIVE DIAGNOSIS:  Chronic drainage from Right nipple  POST-OPERATIVE DIAGNOSIS:  Chronic drainage from Right nipple  PROCEDURE:  Procedure(s): Right Breast Nepple exploration and biopsy  SURGEON:  Surgeon(s): Cherylynn Ridges III, MD  ASSISTANT: None  ANESTHESIA:   local and IV sedation  EBL: 20 ml  BLOOD ADMINISTERED: none  DRAINS: none   SPECIMEN:  Source of Specimen:  right breat nipple ductal tract  COUNTS CORRECT:  YES  PROCEDURE DETAILS: The patient was taken to the operating room and placed on the table in the supine position. After proper time out was performed identifying the patient and procedure to be performed she was given IV sedation then prepped and draped in usual sterile manner exposing the right breast in the right nipple.  We used 1% Xylocaine local anesthetic without epinephrine in order to do a four-quadrant block of the right nipple. We subsequently used a small lacrimal probe in order to cannulate the duct which been draining material chronically on the patient's right breast were we were along the lateral probe into subcutaneous tissue through the nipple down into the breast tissue. There are small flecks of what appeared to be a possible papillary tumor however we took multiple pieces from the duct itself and also the breast tissue deeply.able to pass a lacrimal probe approximately 3 cm deep into the breast tissue then we cut we cut along the lacrimal probe into the breast tissue. in order to . this a #15 blade was used.  I dissected out several pieces of tissue are sent to the lab. We subsequently closed the nipple back using 4-0 Vicryl deep subcutaneous there and then a subcuticular layer of 5-0 Monocryl. No drains left in place. Dermabond and a Xeroform gauze and a Tegaderm used to complete the dressing.  All counts were correct.  PATIENT DISPOSITION:   PACU - hemodynamically stable.   Teshaun Olarte III,Jaiyon Wander O 12/21/20129:57 AM

## 2011-01-18 NOTE — Transfer of Care (Signed)
Immediate Anesthesia Transfer of Care Note  Patient: Angela [name]  Procedure(s) Performed:  EXCISION DUCTAL SYSTEM BREAST  Patient Location: PACU  Anesthesia Type: MAC  Level of Consciousness: awake, alert , oriented and patient cooperative  Airway & Oxygen Therapy: Patient Spontanous Breathing and Patient connected to face mask oxygen  Post-op Assessment: Report given to PACU RN and Post -op Vital signs reviewed and stable  Post vital signs: Reviewed and stable  Complications: No apparent anesthesia complications

## 2011-01-28 ENCOUNTER — Encounter (HOSPITAL_BASED_OUTPATIENT_CLINIC_OR_DEPARTMENT_OTHER): Payer: Self-pay | Admitting: General Surgery

## 2011-02-08 ENCOUNTER — Encounter (INDEPENDENT_AMBULATORY_CARE_PROVIDER_SITE_OTHER): Payer: Self-pay | Admitting: General Surgery

## 2011-02-08 ENCOUNTER — Ambulatory Visit (INDEPENDENT_AMBULATORY_CARE_PROVIDER_SITE_OTHER): Payer: Self-pay | Admitting: General Surgery

## 2011-02-08 VITALS — BP 117/81 | HR 74 | Temp 98.6°F | Resp 18 | Ht 64.0 in | Wt 219.6 lb

## 2011-02-08 DIAGNOSIS — Z09 Encounter for follow-up examination after completed treatment for conditions other than malignant neoplasm: Secondary | ICD-10-CM

## 2011-02-08 NOTE — Progress Notes (Signed)
HPI The patient comes in status post right nipple exploration. She has no complaints.  PE On examination the original dressing was in place. A remove the dressing and the incision appeared to be healing well with no evidence of infection.  Studiy review The patient's pathology and there was no evidence of malignancy.  Assessment Status post right nipple exploration. Doing well.  Plan The patient is to return to see me in 3 weeks if she continues to have drainage from her right breast.

## 2011-08-30 ENCOUNTER — Encounter: Payer: Self-pay | Admitting: Obstetrics and Gynecology

## 2011-09-03 ENCOUNTER — Ambulatory Visit (INDEPENDENT_AMBULATORY_CARE_PROVIDER_SITE_OTHER): Payer: PRIVATE HEALTH INSURANCE | Admitting: Obstetrics and Gynecology

## 2011-09-03 ENCOUNTER — Encounter: Payer: Self-pay | Admitting: Obstetrics and Gynecology

## 2011-09-03 VITALS — BP 118/80 | Ht 64.0 in | Wt 226.0 lb

## 2011-09-03 DIAGNOSIS — Z01419 Encounter for gynecological examination (general) (routine) without abnormal findings: Secondary | ICD-10-CM

## 2011-09-03 NOTE — Progress Notes (Signed)
Last Pap: 01/04/2010 WNL: Yes Regular Periods:meno Contraception: none  Monthly Breast exam:yes Tetanus<97yrs:no Nl.Bladder Function:yes Daily BMs:yes Healthy Diet:yes Calcium:yes Mammogram:yes Date of Mammogram: 12/2010 Exercise:yes Have often Exercise: 1 day a week  Seatbelt: yes Abuse at home: no Stressful work:no Sigmoid-colonoscopy: none Bone Density: No PCP: Dr. Clovis Riley Change in PMH: none Change in ZOX:WRUE  Subjective:    Angela [name] is a 56 y.o. female [phone] who presents for annual exam. The patient complains of brown discharge from her left nipple. The patient had a biopsy of her right breast that was benign last year.  She has a history of fibroids.  In 2009 she had an anterior and posterior colporrhaphy with placement of the TVT.  Her urinary incontinence is better since then.  She has had some leakage recently.  She has not sexually active.  The following portions of the patient's history were reviewed and updated as appropriate: allergies, current medications, past family history, past medical history, past social history, past surgical history and problem list. See above.  Review of Systems Pertinent items are noted in HPI. Gastrointestinal:No change in bowel habits, no abdominal pain, no rectal bleeding Genitourinary:negative for dysuria, frequency, hematuria, nocturia and urinary incontinence    Objective:     BP 118/80  Ht 5\' 4"  (1.626 m)  Wt 226 lb (102.513 kg)  BMI 38.79 kg/m2  Weight:  Wt Readings from Last 1 Encounters:  09/03/11 226 lb (102.513 kg)     BMI: Body mass index is 38.79 kg/(m^2). General Appearance: Alert, appropriate appearance for age. No acute distress HEENT: Grossly normal Neck / Thyroid: Supple, no masses, nodes or enlargement Lungs: clear to auscultation bilaterally Back: No CVA tenderness Breast Exam: No masses or nodes.No dimpling, nipple retraction or discharge. Cardiovascular: Regular rate and rhythm. S1, S2, no  murmur Gastrointestinal: Soft, non-tender, no masses or organomegaly  ++++++++++++++++++++++++++++++++++++++++++++++++++++++++  Pelvic Exam: External genitalia: normal general appearance Vaginal: normal without tenderness, induration or masses and mesh can be felt to the right and to the left of the midline.  There is no evidence of infection. Cervix: normal appearance Adnexa: normal bimanual exam Uterus: upper limits normal size Rectovaginal: normal rectal, no masses  ++++++++++++++++++++++++++++++++++++++++++++++++++++++++  Lymphatic Exam: Non-palpable nodes in neck, clavicular, axillary, or inguinal regions  Psychiatric: Alert and oriented, appropriate affect.    Urinalysis:Not done      Assessment:    brown discharge from the left breast   Fibroids  Mesh erosion following TVT  Overweight or obese: Yes  Pelvic relaxation: Yes  Menopausal symptoms: No. Severe: No.   Plan:   The patient will be evaluated by Dr. Lindie Spruce.  He will decide if he wants an MRI of her breasts.  Follow-up:  in 3 month(s). The patient wants to complete her evaluation of her breasts before we evaluate the vagina further.  The patient is not sexually active.  She was told that if she becomes sexually active and if intercourse is uncomfortable for her or her partner, then we will consider repair of the mesh erosion.  STD screen request: none,   The updated Pap smear screening guidelines were discussed with the patient. The patient requested that I obtain a Pap smear: No.  Kegel exercises discussed: Yes.  Proper diet and regular exercise were reviewed.  Annual mammograms recommended starting at age 42. Proper breast care was discussed.  Screening colonoscopy is recommended beginning at age 51.  Regular health maintenance was reviewed.  Sleep hygiene was discussed.  Adequate calcium and  vitamin D intake was emphasized.  Mylinda Latina.D.

## 2011-09-24 ENCOUNTER — Encounter (INDEPENDENT_AMBULATORY_CARE_PROVIDER_SITE_OTHER): Payer: Self-pay | Admitting: General Surgery

## 2011-09-24 ENCOUNTER — Ambulatory Visit (INDEPENDENT_AMBULATORY_CARE_PROVIDER_SITE_OTHER): Payer: PRIVATE HEALTH INSURANCE | Admitting: General Surgery

## 2011-09-24 VITALS — BP 152/84 | HR 68 | Temp 98.0°F | Resp 16 | Ht 64.0 in | Wt 227.0 lb

## 2011-09-24 DIAGNOSIS — N6459 Other signs and symptoms in breast: Secondary | ICD-10-CM

## 2011-09-24 DIAGNOSIS — N6452 Nipple discharge: Secondary | ICD-10-CM

## 2011-09-24 NOTE — Progress Notes (Signed)
The patient comes in today with drainage from her left nipple. She had a previous no prolonged expiration on the right side. That is no longer draining.  The patient had an MRI of her left breast which demonstrated no abnormality but because of the continued drainage she's due for repeat soon. I would recommend that she continue to get her repeat MRI.  The characteristic of the drainage from her left nipple is light greenish color. It is nonbloody. I do not think she requires a nipple expiration at this time. If you should change to bloody discharge the nipple expiration is born to for possible intraductal papilloma. In the meantime I look forward to getting a copy of the results of her MRI scan. She is to return to see me on a when necessary basis.

## 2011-11-08 ENCOUNTER — Other Ambulatory Visit (INDEPENDENT_AMBULATORY_CARE_PROVIDER_SITE_OTHER): Payer: Self-pay | Admitting: General Surgery

## 2011-11-08 ENCOUNTER — Telehealth: Payer: Self-pay | Admitting: Obstetrics and Gynecology

## 2011-11-08 DIAGNOSIS — N6320 Unspecified lump in the left breast, unspecified quadrant: Secondary | ICD-10-CM

## 2011-11-08 NOTE — Telephone Encounter (Signed)
Tc to pt regarding msg, lm on vm to call back. 

## 2011-11-14 ENCOUNTER — Ambulatory Visit
Admission: RE | Admit: 2011-11-14 | Discharge: 2011-11-14 | Disposition: A | Discharge status: Home / Self Care | Payer: PRIVATE HEALTH INSURANCE | Source: Ambulatory Visit | Attending: General Surgery | Admitting: General Surgery

## 2011-11-14 DIAGNOSIS — N6320 Unspecified lump in the left breast, unspecified quadrant: Secondary | ICD-10-CM

## 2012-01-10 DIAGNOSIS — Z0000 Encounter for general adult medical examination without abnormal findings: Secondary | ICD-10-CM

## 2012-02-04 ENCOUNTER — Encounter: Payer: PRIVATE HEALTH INSURANCE | Admitting: Obstetrics & Gynecology

## 2012-04-09 ENCOUNTER — Encounter: Payer: PRIVATE HEALTH INSURANCE | Admitting: Obstetrics & Gynecology

## 2012-04-22 ENCOUNTER — Ambulatory Visit: Payer: PRIVATE HEALTH INSURANCE | Admitting: Obstetrics & Gynecology

## 2012-04-22 ENCOUNTER — Encounter: Payer: Self-pay | Admitting: Obstetrics & Gynecology

## 2012-04-22 VITALS — BP 127/89 | HR 82 | Resp 16 | Ht 65.0 in | Wt 227.0 lb

## 2012-04-22 DIAGNOSIS — T83711A Erosion of implanted vaginal mesh and other prosthetic materials to surrounding organ or tissue, initial encounter: Secondary | ICD-10-CM

## 2012-04-22 NOTE — Progress Notes (Signed)
  Subjective:    Patient ID: Angela [name], female    DOB: 02-05-1955, 57 y.o.   MRN: [id number]  HPI  57 yo pleasant AA lady is here for a second opinion about fixing a vaginal mesh sling which has eroded. (Placed years ago by Dr. Stefano Gaul). I have told her that I can save her money by telling her right away that I would recommend that a second opinion be given by a urologist as I have only seen one extrusion in the last 17 years.  Review of Systems     Objective:   Physical Exam        Assessment & Plan:  As above. We have referred her to Dr. Ileene Patrick.

## 2012-09-04 ENCOUNTER — Other Ambulatory Visit: Payer: Self-pay | Admitting: *Deleted

## 2012-09-04 DIAGNOSIS — E039 Hypothyroidism, unspecified: Secondary | ICD-10-CM | POA: Insufficient documentation

## 2012-09-08 ENCOUNTER — Other Ambulatory Visit: Payer: PRIVATE HEALTH INSURANCE

## 2012-09-09 ENCOUNTER — Ambulatory Visit: Payer: PRIVATE HEALTH INSURANCE | Admitting: Endocrinology

## 2012-09-15 ENCOUNTER — Other Ambulatory Visit (INDEPENDENT_AMBULATORY_CARE_PROVIDER_SITE_OTHER): Payer: BC Managed Care – PPO

## 2012-09-15 DIAGNOSIS — E039 Hypothyroidism, unspecified: Secondary | ICD-10-CM

## 2012-09-15 LAB — TSH: TSH: 1.96 u[IU]/mL (ref 0.35–5.50)

## 2012-09-15 LAB — T4, FREE: Free T4: 1.05 ng/dL (ref 0.60–1.60)

## 2012-09-17 ENCOUNTER — Ambulatory Visit (INDEPENDENT_AMBULATORY_CARE_PROVIDER_SITE_OTHER): Payer: BC Managed Care – PPO | Admitting: Endocrinology

## 2012-09-17 ENCOUNTER — Encounter: Payer: Self-pay | Admitting: Endocrinology

## 2012-09-17 VITALS — BP 130/78 | HR 69 | Temp 98.5°F | Resp 12 | Ht 65.0 in | Wt 233.9 lb

## 2012-09-17 DIAGNOSIS — E049 Nontoxic goiter, unspecified: Secondary | ICD-10-CM

## 2012-09-17 DIAGNOSIS — E039 Hypothyroidism, unspecified: Secondary | ICD-10-CM

## 2012-09-17 NOTE — Progress Notes (Signed)
Patient ID: Angela [name], female   DOB: 1955-12-28, 56 y.o.   MRN: [id number]  Reason for Appointment:  Hypothyroidism, followup visit    History of Present Illness:   The hypothyroidism was first diagnosed 15 years when she was being treated for a multinodular goiter. At that time she was having some complaints of fatigue and her TSH was 6.5 Also had positive TPO antibody of 360 With this she has had good results and has been on 112 mcg supplement for at least 4-5 years Currently she is not complaining of any unusual fatigue, cold intolerance or swelling in her neck    Compliance with the medical regimen has been as prescribed with taking the tablet in the morning before breakfast.   GOITER: She had a multinodular goiter in the 1990s and had a 1.2 cm left-sided nodule which was benign on biopsy No recent ultrasounds have been done  Appointment on 09/15/2012  Component Date Value Range Status  . TSH 09/15/2012 1.96  0.35 - 5.50 uIU/mL Final  . Free T4 09/15/2012 1.05  0.60 - 1.60 ng/dL Final      Medication List       This list is accurate as of: 09/17/12  9:57 AM.  Always use your most recent med list.               ALLERGY MED PO  Take by mouth.     cetirizine 10 MG tablet  Commonly known as:  ZYRTEC  Take 10 mg by mouth daily.     LEVOXYL 112 MCG tablet  Generic drug:  levothyroxine  daily.     mometasone 220 MCG/INH inhaler  Commonly known as:  ASMANEX  Inhale 2 puffs into the lungs daily.     MUCINEX ALLERGY PO  Take by mouth.     multivitamin tablet  Take 1 tablet by mouth daily.     PATANASE 0.6 % Soln  Generic drug:  Olopatadine HCl  Place into the nose daily.     PROVENTIL HFA 108 (90 BASE) MCG/ACT inhaler  Generic drug:  albuterol  Ad lib.        Past Medical History  Diagnosis Date  . Breast discharge   . Asthma   . Hyperlipidemia   . GERD (gastroesophageal reflux disease)   . Seizures     hx of during childhood -not since age 59  .  Thygeson's disease   . Headache(784.0)   . Thyroid disease     Past Surgical History  Procedure Laterality Date  . Anterior and posterior repair  2007  . Tubal ligation  1998  . Tympanostomy tube placement  2010    right ear   . Breast ductal system excision  01/18/2011    Procedure: EXCISION DUCTAL SYSTEM BREAST;  Surgeon: Jetty Duhamel, MD;  Location: [facility];  Service: General;  Laterality: Right;  . Breast surgery  01/18/2011    Right BX  . Dilation and curettage of uterus    . Wisdom tooth extraction      Family History  Problem Relation Age of Onset  . Cancer Father     throat  . Cancer Maternal Aunt     breast    Social History:  reports that she has never smoked. She has never used smokeless tobacco. She reports that  drinks alcohol. She reports that she does not use illicit drugs.  Allergies:  Allergies  Allergen Reactions  . Aspirin   .  Ibuprofen      Examination:   BP 130/78  Pulse 69  Temp(Src) 98.5 F (36.9 C)  Resp 12  Ht 5\' 5"  (1.651 m)  Wt 233 lb 14.4 oz (106.096 kg)  BMI 38.92 kg/m2  SpO2 96%   GENERAL APPEARANCE: Alert And looks well.    FACE: No puffiness of face , no puffiness of her fingers  NECK:  thyroid is enlarged about 1-1/2 times normal, mostly on the right side and feels nodular       NEUROLOGIC EXAM: DTRs 2+ bilaterally at biceps.    Assessments   Hypothyroidism, likely to be from Hashimoto thyroiditis Small right-sided goiter with questionable nodule. She does have a history of a left-sided thyroid nodule that was compatible with Hashimoto thyroiditis Will review her records from the previous exams and if the exam finding is new today will repeat her ultrasound   Treatment:   To continue same dosage before breakfast daily  Angela [name] 09/17/2012, 9:57 AM   Addendum: Her last exam in 2011 available for review showed similar findings, will follow her annually

## 2012-10-08 ENCOUNTER — Emergency Department (INDEPENDENT_AMBULATORY_CARE_PROVIDER_SITE_OTHER): Payer: BC Managed Care – PPO

## 2012-10-08 ENCOUNTER — Encounter (HOSPITAL_COMMUNITY): Payer: Self-pay | Admitting: Emergency Medicine

## 2012-10-08 ENCOUNTER — Emergency Department (HOSPITAL_COMMUNITY)
Admission: EM | Admit: 2012-10-08 | Discharge: 2012-10-08 | Disposition: A | Discharge status: Home / Self Care | Payer: BC Managed Care – PPO | Source: Home / Self Care | Attending: Family Medicine | Admitting: Family Medicine

## 2012-10-08 DIAGNOSIS — J40 Bronchitis, not specified as acute or chronic: Secondary | ICD-10-CM

## 2012-10-08 MED ORDER — PREDNISONE 20 MG PO TABS: 20.0000 mg | ORAL_TABLET | Freq: Every day | ORAL | Status: DC

## 2012-10-08 MED ORDER — IPRATROPIUM BROMIDE 0.02 % IN SOLN
0.5000 mg | Freq: Once | RESPIRATORY_TRACT | Status: AC
  Administered 2012-10-08: 0.5 mg via RESPIRATORY_TRACT

## 2012-10-08 MED ORDER — ALBUTEROL SULFATE (5 MG/ML) 0.5% IN NEBU
2.5000 mg | INHALATION_SOLUTION | Freq: Once | RESPIRATORY_TRACT | Status: AC
  Administered 2012-10-08: 2.5 mg via RESPIRATORY_TRACT

## 2012-10-08 MED ORDER — ALBUTEROL SULFATE (5 MG/ML) 0.5% IN NEBU
INHALATION_SOLUTION | RESPIRATORY_TRACT | Status: AC
  Filled 2012-10-08: qty 1

## 2012-10-08 MED ORDER — ALBUTEROL SULFATE HFA 108 (90 BASE) MCG/ACT IN AERS: 2.0000 | INHALATION_SPRAY | Freq: Four times a day (QID) | RESPIRATORY_TRACT | Status: DC | PRN

## 2012-10-08 MED ORDER — GUAIFENESIN-CODEINE 100-10 MG/5ML PO SOLN: 5.0000 mL | Freq: Three times a day (TID) | ORAL | Status: DC | PRN

## 2012-10-08 NOTE — ED Notes (Signed)
Patient has headache, body aches, chest congestion, head congestion, no fever, eyes red, coughing up yellow/green phlegm.  Taking otc cold medicines, not improving, getting worse.  Grandchild has been sick

## 2012-10-08 NOTE — ED Provider Notes (Signed)
Angela [name] is a 57 y.o. female who presents to Urgent Care today for cough, congestion, headache, mild conjunctivitis, and fatigue. She additionally notes some mild wheezing. The symptoms have been present now for 5 days. Patient notes that contacts with a grandson who is ill with a respiratory illness previously. She's tried multiple over-the-counter medications which have not been helpful. She denies any significant trouble breathing nausea vomiting or diarrhea. She feels well otherwise.    Past Medical History  Diagnosis Date  . Breast discharge   . Asthma   . Hyperlipidemia   . GERD (gastroesophageal reflux disease)   . Seizures     hx of during childhood -not since age 12  . Thygeson's disease   . Headache(784.0)   . Thyroid disease    History  Substance Use Topics  . Smoking status: Never Smoker   . Smokeless tobacco: Never Used  . Alcohol Use: Yes   ROS as above Medications reviewed. Current Facility-Administered Medications  Medication Dose Route Frequency Provider Last Rate Last Dose  . ipratropium (ATROVENT) nebulizer solution 0.5 mg  0.5 mg Nebulization Once Rodolph Bong, MD       And  . albuterol (PROVENTIL) (5 MG/ML) 0.5% nebulizer solution 2.5 mg  2.5 mg Nebulization Once Rodolph Bong, MD       Current Outpatient Prescriptions  Medication Sig Dispense Refill  . OVER THE COUNTER MEDICATION Equate daytime/night time cold medicine, tussin cf, allegra d      . cetirizine (ZYRTEC) 10 MG tablet Take 10 mg by mouth daily.        . DiphenhydrAMINE HCl (ALLERGY MED PO) Take by mouth.      . Fexofenadine HCl (MUCINEX ALLERGY PO) Take by mouth.      . LEVOXYL 112 MCG tablet daily.      . mometasone (ASMANEX) 220 MCG/INH inhaler Inhale 2 puffs into the lungs daily.        . Multiple Vitamin (MULTIVITAMIN) tablet Take 1 tablet by mouth daily.      . Olopatadine HCl (PATANASE) 0.6 % SOLN Place into the nose daily.        Marland Kitchen PROVENTIL HFA 108 (90 BASE) MCG/ACT inhaler Ad lib.         Exam:  BP 136/95  Pulse 74  Temp(Src) 98.3 F (36.8 C) (Oral)  Resp 16  SpO2 100% Gen: Well NAD HEENT: EOMI,  MMM, normal-appearing tympanic membranes bilaterally. Posterior pharynx is mildly erythematous. Mild conjunctival injection bilaterally Lungs: Work of breathing. Slight wheezing bilaterally. Heart: RRR no MRG Abd: NABS, NT, ND Exts: Non edematous BL  LE, warm and well perfused.   Following DuoNeb nebulizer treatment patient had significant improvement in subjective symptoms. Her lung sounds became clear.  No results found for this or any previous visit (from the past 24 hour(s)). Dg Chest 2 View  10/08/2012   *RADIOLOGY REPORT*  Clinical Data: Cough, fever  CHEST - 2 VIEW  Comparison:  None.  Findings:  The heart size and mediastinal contours are within normal limits.  Both lungs are clear.  The visualized skeletal structures are unremarkable.  IMPRESSION: No active cardiopulmonary disease.   Original Report Authenticated By: Judie Petit. Miles Costain, M.D.    Assessment and Plan: 57 y.o. female with bronchitis with possible asthma exacerbation.  Patient had considerable improvement with DuoNeb.  Plan to use prednisone, albuterol, and codeine containing cough medication as needed Followup as needed Discussed warning signs or symptoms. Please see discharge instructions. Patient expresses understanding.  Rodolph Bong, MD 10/08/12 [phone]

## 2012-11-20 ENCOUNTER — Other Ambulatory Visit: Payer: Self-pay

## 2012-11-20 DIAGNOSIS — Z1231 Encounter for screening mammogram for malignant neoplasm of breast: Secondary | ICD-10-CM

## 2012-12-18 ENCOUNTER — Ambulatory Visit
Admission: RE | Admit: 2012-12-18 | Discharge: 2012-12-18 | Disposition: A | Discharge status: Home / Self Care | Payer: BC Managed Care – PPO | Source: Ambulatory Visit

## 2012-12-18 DIAGNOSIS — Z1231 Encounter for screening mammogram for malignant neoplasm of breast: Secondary | ICD-10-CM

## 2013-02-12 ENCOUNTER — Emergency Department (HOSPITAL_COMMUNITY)
Admission: EM | Admit: 2013-02-12 | Discharge: 2013-02-12 | Disposition: A | Discharge status: Home / Self Care | Payer: BC Managed Care – PPO | Source: Home / Self Care | Attending: Family Medicine | Admitting: Family Medicine

## 2013-02-12 ENCOUNTER — Other Ambulatory Visit: Payer: Self-pay | Admitting: *Deleted

## 2013-02-12 ENCOUNTER — Encounter (HOSPITAL_COMMUNITY): Payer: Self-pay | Admitting: Emergency Medicine

## 2013-02-12 DIAGNOSIS — H00019 Hordeolum externum unspecified eye, unspecified eyelid: Secondary | ICD-10-CM

## 2013-02-12 MED ORDER — LEVOTHYROXINE SODIUM 112 MCG PO TABS: 112.0000 ug | ORAL_TABLET | Freq: Every day | ORAL | Status: DC

## 2013-02-12 MED ORDER — ERYTHROMYCIN 5 MG/GM OP OINT: TOPICAL_OINTMENT | OPHTHALMIC | Status: DC

## 2013-02-12 NOTE — ED Notes (Signed)
Pt  Reports   She    Got   An  Irritation  To  Her   r  Eye       From the  Liquid     In a  Balloon       She         Has  Some  Pain   Swelling and   Redness  To  The  Affected  Eye

## 2013-02-12 NOTE — ED Provider Notes (Signed)
MomoCSN: [id number]     Arrival date & time 02/12/13  0805 History   First MD Initiated Contact with Patient 02/12/13 [phone]     Chief Complaint  Patient presents with  . Eye Problem   (Consider location/radiation/quality/duration/timing/severity/associated sxs/prior Treatment) HPI Comments: Small tender erythematous area at right lower eyelid. No fever. No changes in vision. No contact lenses use. No injury.   Patient is a 58 y.o. female presenting with eye problem. The history is provided by the patient.  Eye Problem Location:  R eye Quality:  Aching Severity:  Mild Onset quality:  Gradual Duration:  4 days   Past Medical History  Diagnosis Date  . Breast discharge   . Asthma   . Hyperlipidemia   . GERD (gastroesophageal reflux disease)   . Seizures     hx of during childhood -not since age 21  . Thygeson's disease   . Headache(784.0)   . Thyroid disease    Past Surgical History  Procedure Laterality Date  . Anterior and posterior repair  2007  . Tubal ligation  1998  . Tympanostomy tube placement  2010    right ear   . Breast ductal system excision  01/18/2011    Procedure: EXCISION DUCTAL SYSTEM BREAST;  Surgeon: Belva Crome, MD;  Location: [facility];  Service: General;  Laterality: Right;  . Breast surgery  01/18/2011    Right BX  . Dilation and curettage of uterus    . Wisdom tooth extraction     Family History  Problem Relation Age of Onset  . Cancer Father     throat  . Cancer Maternal Aunt     breast   History  Substance Use Topics  . Smoking status: Never Smoker   . Smokeless tobacco: Never Used  . Alcohol Use: Yes   OB History   Grav Para Term Preterm Abortions TAB SAB Ect Mult Living   6 4 4  2  2   4      Review of Systems  All other systems reviewed and are negative.    Allergies  Aspirin and Ibuprofen  Home Medications   Current Outpatient Rx  Name  Route  Sig  Dispense  Refill  . albuterol (PROVENTIL HFA) 108  (90 BASE) MCG/ACT inhaler   Inhalation   Inhale 2 puffs into the lungs every 6 (six) hours as needed for wheezing.   1 Inhaler   1   . cetirizine (ZYRTEC) 10 MG tablet   Oral   Take 10 mg by mouth daily.           . DiphenhydrAMINE HCl (ALLERGY MED PO)   Oral   Take by mouth.         . Fexofenadine HCl (MUCINEX ALLERGY PO)   Oral   Take by mouth.         Marland Kitchen guaiFENesin-codeine 100-10 MG/5ML syrup   Oral   Take 5 mLs by mouth 3 (three) times daily as needed for cough.   120 mL   0   . LEVOXYL 112 MCG tablet      daily.         . mometasone (ASMANEX) 220 MCG/INH inhaler   Inhalation   Inhale 2 puffs into the lungs daily.           . Multiple Vitamin (MULTIVITAMIN) tablet   Oral   Take 1 tablet by mouth daily.         . Olopatadine HCl (  PATANASE) 0.6 % SOLN   Nasal   Place into the nose daily.           Marland Kitchen OVER THE COUNTER MEDICATION      Equate daytime/night time cold medicine, tussin cf, allegra d         . predniSONE (DELTASONE) 20 MG tablet   Oral   Take 1 tablet (20 mg total) by mouth daily.   7 tablet   0    BP 127/92  Pulse 71  Temp(Src) 98 F (36.7 C) (Oral)  Resp 18  SpO2 97% Physical Exam  Nursing note and vitals reviewed. Constitutional: She is oriented to person, place, and time. She appears well-developed and well-nourished. No distress.  HENT:  Head: Normocephalic and atraumatic.  Eyes: Conjunctivae and EOM are normal. Pupils are equal, round, and reactive to light. Lids are everted and swept, no foreign bodies found. Right eye exhibits hordeolum. Right eye exhibits no chemosis, no discharge and no exudate. No foreign body present in the right eye. Left eye exhibits no chemosis, no discharge, no exudate and no hordeolum. No foreign body present in the left eye. No scleral icterus.  No pain with EOMs  Cardiovascular: Normal rate.   Pulmonary/Chest: Effort normal.  Musculoskeletal: Normal range of motion.  Neurological: She is  alert and oriented to person, place, and time.  Skin: Skin is warm and dry.  Psychiatric: She has a normal mood and affect. Her behavior is normal.    ED Course  Procedures (including critical care time) Labs Review Labs Reviewed - No data to display Imaging Review No results found.  EKG Interpretation    Date/Time:    Ventricular Rate:    PR Interval:    QRS Duration:   QT Interval:    QTC Calculation:   R Axis:     Text Interpretation:              MDM   Small stye right lower eye lid. Advised warm compresses TID-QID and e-mycin ointment as prescribed. PCP follow up if no improvement.     [place], [place] 02/12/13 [place], [place] 02/12/13 [phone]

## 2013-02-16 NOTE — ED Provider Notes (Signed)
Medical screening examination/treatment/procedure(s) were performed by a resident physician or non-physician practitioner and as the supervising physician I was immediately available for consultation/collaboration.  Lynne Leader, MD    Gregor Hams, MD 02/16/13 [phone]

## 2013-09-11 ENCOUNTER — Encounter (HOSPITAL_COMMUNITY): Payer: Self-pay | Admitting: Emergency Medicine

## 2013-09-11 ENCOUNTER — Emergency Department (HOSPITAL_COMMUNITY)
Admission: EM | Admit: 2013-09-11 | Discharge: 2013-09-11 | Disposition: A | Discharge status: Home / Self Care | Payer: No Typology Code available for payment source | Attending: Emergency Medicine | Admitting: Emergency Medicine

## 2013-09-11 DIAGNOSIS — Z79899 Other long term (current) drug therapy: Secondary | ICD-10-CM | POA: Insufficient documentation

## 2013-09-11 DIAGNOSIS — Z8742 Personal history of other diseases of the female genital tract: Secondary | ICD-10-CM | POA: Insufficient documentation

## 2013-09-11 DIAGNOSIS — Z8669 Personal history of other diseases of the nervous system and sense organs: Secondary | ICD-10-CM | POA: Diagnosis not present

## 2013-09-11 DIAGNOSIS — J45909 Unspecified asthma, uncomplicated: Secondary | ICD-10-CM | POA: Insufficient documentation

## 2013-09-11 DIAGNOSIS — R040 Epistaxis: Secondary | ICD-10-CM | POA: Diagnosis not present

## 2013-09-11 DIAGNOSIS — E079 Disorder of thyroid, unspecified: Secondary | ICD-10-CM | POA: Insufficient documentation

## 2013-09-11 DIAGNOSIS — Z8719 Personal history of other diseases of the digestive system: Secondary | ICD-10-CM | POA: Insufficient documentation

## 2013-09-11 MED ORDER — OXYMETAZOLINE HCL 0.05 % NA SOLN
1.0000 | Freq: Two times a day (BID) | NASAL | Status: DC
  Administered 2013-09-11: 1 via NASAL
  Filled 2013-09-11: qty 15

## 2013-09-11 NOTE — ED Notes (Signed)
Bed: WA20 Expected date:  Expected time:  Means of arrival:  Comments: nosebleed

## 2013-09-11 NOTE — ED Notes (Signed)
Nosebleed has ceased at this time. Pt has no complaints

## 2013-09-11 NOTE — Discharge Instructions (Signed)
If bleeding recurs, you begin to feel weak, short of breath, return to ED for further evaluation.   Nosebleed Nosebleeds can be caused by many conditions, including trauma, infections, polyps, foreign bodies, dry mucous membranes or climate, medicines, and air conditioning. Most nosebleeds occur in the front of the nose. Because of this location, most nosebleeds can be controlled by pinching the nostrils gently and continuously for at least 10 to 20 minutes. The long, continuous pressure allows enough time for the blood to clot. If pressure is released during that 10 to 20 minute time period, the process may have to be started again. The nosebleed may stop by itself or quit with pressure, or it may need concentrated heating (cautery) or pressure from packing. HOME CARE INSTRUCTIONS   If your nose was packed, try to maintain the pack inside until your health care provider removes it. If a gauze pack was used and it starts to fall out, gently replace it or cut the end off. Do not cut if a balloon catheter was used to pack the nose. Otherwise, do not remove unless instructed.  Avoid blowing your nose for 12 hours after treatment. This could dislodge the pack or clot and start the bleeding again.  If the bleeding starts again, sit up and bend forward, gently pinching the front half of your nose continuously for 20 minutes.  If bleeding was caused by dry mucous membranes, use over-the-counter saline nasal spray or gel. This will keep the mucous membranes moist and allow them to heal. If you must use a lubricant, choose the water-soluble variety. Use it only sparingly and not within several hours of lying down.  Do not use petroleum jelly or mineral oil, as these may drip into the lungs and cause serious problems.  Maintain humidity in your home by using less air conditioning or by using a humidifier.  Do not use aspirin or medicines which make bleeding more likely. Your health care provider can give  you recommendations on this.  Resume normal activities as you are able, but try to avoid straining, lifting, or bending at the waist for several days.  If the nosebleeds become recurrent and the cause is unknown, your health care provider may suggest laboratory tests. SEEK MEDICAL CARE IF: You have a fever. SEEK IMMEDIATE MEDICAL CARE IF:   Bleeding recurs and cannot be controlled.  There is unusual bleeding from or bruising on other parts of the body.  Nosebleeds continue.  There is any worsening of the condition which originally brought you in.  You become light-headed, feel faint, become sweaty, or vomit blood. MAKE SURE YOU:   Understand these instructions.  Will watch your condition.  Will get help right away if you are not doing well or get worse. Document Released: 10/24/2004 Document Revised: 05/31/2013 Document Reviewed: 12/15/2008 [facility] Patient Information 2015 [place], [place]. This information is not intended to replace advice given to you by your health care provider. Make sure you discuss any questions you have with your health care provider.

## 2013-09-11 NOTE — ED Notes (Signed)
Ambulated pt around hallway without incident,  Pt states she feels ready to go home

## 2013-09-11 NOTE — ED Provider Notes (Signed)
CSN: [id number]     Arrival date & time 09/11/13  1745 History   First MD Initiated Contact with Patient 09/11/13 1754     Chief Complaint  Patient presents with  . Epistaxis     (Consider location/radiation/quality/duration/timing/severity/associated sxs/prior Treatment) HPI Angela [name] is a 58 y.o. female who comes in complaining of a nosebleed. Should the bleeding began about 4:30 PM this afternoon and bled for about one to 2 minutes. She reports calling the paramedics at which time the bleeding had stopped for about 45 minutes. The bleeding resumed and and she requested to be taken to the ED. She reports this is her first nose bleed and does not know what could have caused it. She does have Nasacort at home but denies using it recently. She denies inhaling any other medications or other substances. She denies any bleeding disorders,taking any anticoagulation agents, headache, vision changes, chest pain, or shortness of breath. Past Medical History  Diagnosis Date  . Breast discharge   . Asthma   . Hyperlipidemia   . GERD (gastroesophageal reflux disease)   . Seizures     hx of during childhood -not since age 29  . Thygeson's disease   . Headache(784.0)   . Thyroid disease    Past Surgical History  Procedure Laterality Date  . Anterior and posterior repair  2007  . Tubal ligation  1998  . Tympanostomy tube placement  2010    right ear   . Breast ductal system excision  01/18/2011    Procedure: EXCISION DUCTAL SYSTEM BREAST;  Surgeon: Belva Crome, MD;  Location: [facility];  Service: General;  Laterality: Right;  . Breast surgery  01/18/2011    Right BX  . Dilation and curettage of uterus    . Wisdom tooth extraction     Family History  Problem Relation Age of Onset  . Cancer Father     throat  . Cancer Maternal Aunt     breast   History  Substance Use Topics  . Smoking status: Never Smoker   . Smokeless tobacco: Never Used  . Alcohol Use: Yes    OB History   Grav Para Term Preterm Abortions TAB SAB Ect Mult Living   6 4 4  2  2   4      Review of Systems  Constitutional: Negative for fever.  HENT: Positive for nosebleeds.   Eyes: Negative for visual disturbance.  Respiratory: Negative for choking and shortness of breath.   Cardiovascular: Negative for chest pain.  Gastrointestinal: Negative for nausea.  Allergic/Immunologic: Negative for environmental allergies and food allergies.  Neurological: Negative for dizziness, weakness and headaches.  Hematological: Does not bruise/bleed easily.      Allergies  Aspirin and Ibuprofen  Home Medications   Prior to Admission medications   Medication Sig Start Date End Date Taking? Authorizing Provider  albuterol (PROVENTIL HFA) 108 (90 BASE) MCG/ACT inhaler Inhale 2 puffs into the lungs every 6 (six) hours as needed for wheezing. 10/08/12  Yes Gregor Hams, MD  levothyroxine (SYNTHROID, LEVOTHROID) 112 MCG tablet Take 1 tablet (112 mcg total) by mouth daily. 02/12/13  Yes Elayne Snare, MD  loratadine-pseudoephedrine (CLARITIN-D 24-HOUR) 10-240 MG per 24 hr tablet Take 1 tablet by mouth daily.   Yes Historical Provider, MD  Multiple Vitamin (MULTIVITAMIN) tablet Take 1 tablet by mouth daily.   Yes Historical Provider, MD  triamcinolone (NASACORT AQ) 55 MCG/ACT AERO nasal inhaler Place 2 sprays into the  nose as needed (allergies).   Yes Historical Provider, MD   BP 154/125  Pulse 67  Temp(Src) 98.1 F (36.7 C) (Oral)  Resp 16  SpO2 98% Physical Exam  Nursing note and vitals reviewed. Constitutional: She appears well-developed and well-nourished. No distress.  HENT:  Head: Normocephalic.  Nose: No mucosal edema, sinus tenderness or nasal deformity. Epistaxis is observed.  No foreign bodies.  Mild epistaxis to the L anterior nare. No foreign bodies noted, no nasal polyps or septal abnormalities noted. No blood noted in the oropharynx.  Eyes: Conjunctivae and EOM are normal.  Right eye exhibits no discharge. Left eye exhibits no discharge.    ED Course  Procedures (including critical care time) Labs Review Labs Reviewed - No data to display  Imaging Review No results found.   EKG Interpretation None     Patient is resting comfortably in emergency department.  Large clot was removed at bedside and she is now applying anterior pressure for 15 minutes. Hemostasis achieved with direct anterior pressure, then bleeding resumes 4-5 min later. 2nd round of direct anterior pressure for 20 min. Hemostasis achieved 30 min. Nasal packing + afrin. Hemostasis achieved with walking done around ED without reoccurance of bleeding Patient able to breath comfortably without any sensation of bleeding. Return precautions discussed. Pt very receptive of plan to be DC and return if symptoms return/get worse, weakness, dizziness.  MDM   Final diagnoses:  Epistaxis   Meds given in ED:  Medications  oxymetazoline (AFRIN) 0.05 % nasal spray 1 spray (1 spray Each Nare Given by Other 09/11/13 2219)    New Prescriptions   No medications on file   Prior to patient discharge, I discussed and reviewed this case with Dr.Gentry         Verl Dicker, PA-C 09/11/13 2305

## 2013-09-11 NOTE — ED Notes (Signed)
Per EMS pt complaint of nosebleed starting 1.5 hours ago. Bleeding stopped so decided not to go to hospital. Pt called EMS back out 45 minutes ago with recurrent nosebleed. Pt no hx of nosebleed or hypertension. No active bleeding at present time; clot to left nostril present and attached to tissue.

## 2013-09-12 NOTE — ED Provider Notes (Signed)
Medical screening examination/treatment/procedure(s) were performed by non-physician practitioner and as supervising physician I was immediately available for consultation/collaboration.   EKG Interpretation None        Debby Freiberg, MD 09/12/13 1452

## 2013-09-13 ENCOUNTER — Other Ambulatory Visit: Payer: BC Managed Care – PPO

## 2013-09-16 ENCOUNTER — Ambulatory Visit: Payer: BC Managed Care – PPO | Admitting: Endocrinology

## 2013-09-16 ENCOUNTER — Encounter: Payer: Self-pay | Admitting: *Deleted

## 2013-09-16 DIAGNOSIS — Z0289 Encounter for other administrative examinations: Secondary | ICD-10-CM

## 2013-11-29 ENCOUNTER — Encounter (HOSPITAL_COMMUNITY): Payer: Self-pay | Admitting: Emergency Medicine

## 2014-01-26 ENCOUNTER — Telehealth: Payer: Self-pay | Admitting: Endocrinology

## 2014-01-26 ENCOUNTER — Other Ambulatory Visit: Payer: Self-pay | Admitting: *Deleted

## 2014-01-26 MED ORDER — LEVOTHYROXINE SODIUM 112 MCG PO TABS: 112.0000 ug | ORAL_TABLET | Freq: Every day | ORAL | Status: DC

## 2014-01-26 NOTE — Telephone Encounter (Signed)
Patient called and scheduled an appointment for 01/31/14 Can we please fill for a couple of pills Levothyroxine till her appointment?  Thank you

## 2014-01-26 NOTE — Telephone Encounter (Signed)
rx sent for 4 tablets

## 2014-01-31 ENCOUNTER — Ambulatory Visit: Payer: No Typology Code available for payment source | Admitting: Endocrinology

## 2014-09-02 ENCOUNTER — Other Ambulatory Visit: Payer: Self-pay | Admitting: Urology

## 2014-10-25 ENCOUNTER — Encounter (HOSPITAL_COMMUNITY): Payer: Self-pay

## 2014-10-25 ENCOUNTER — Encounter (HOSPITAL_COMMUNITY)
Admission: RE | Admit: 2014-10-25 | Discharge: 2014-10-25 | Disposition: A | Discharge status: Home / Self Care | Payer: BLUE CROSS/BLUE SHIELD | Source: Ambulatory Visit | Attending: Urology | Admitting: Urology

## 2014-10-25 DIAGNOSIS — Z01818 Encounter for other preprocedural examination: Secondary | ICD-10-CM | POA: Insufficient documentation

## 2014-10-25 HISTORY — DX: Anxiety disorder, unspecified: F41.9

## 2014-10-25 HISTORY — DX: Hypothyroidism, unspecified: E03.9

## 2014-10-25 HISTORY — DX: Depression, unspecified: F32.A

## 2014-10-25 HISTORY — DX: Sleep apnea, unspecified: G47.30

## 2014-10-25 HISTORY — DX: Major depressive disorder, single episode, unspecified: F32.9

## 2014-10-25 HISTORY — DX: Tinnitus, unspecified ear: H93.19

## 2014-10-25 HISTORY — DX: Other specified disorders of right ear: H93.8X1

## 2014-10-25 LAB — BASIC METABOLIC PANEL
Anion gap: 8 (ref 5–15)
BUN: 12 mg/dL (ref 6–20)
CO2: 24 mmol/L (ref 22–32)
Calcium: 8.8 mg/dL — ABNORMAL LOW (ref 8.9–10.3)
Chloride: 108 mmol/L (ref 101–111)
Creatinine, Ser: 1.05 mg/dL — ABNORMAL HIGH (ref 0.44–1.00)
GFR calc Af Amer: >60 mL/min (ref >60)
GFR calc non Af Amer: 58 mL/min — ABNORMAL LOW (ref >60)
Glucose, Bld: 99 mg/dL (ref 65–99)
Potassium: 4.8 mmol/L (ref 3.5–5.1)
Sodium: 140 mmol/L (ref 135–145)

## 2014-10-25 LAB — CBC
HCT: 41.1 % (ref 36.0–46.0)
Hemoglobin: 13.3 g/dL (ref 12.0–15.0)
MCH: 24.1 pg — ABNORMAL LOW (ref 26.0–34.0)
MCHC: 32.4 g/dL (ref 30.0–36.0)
MCV: 74.5 fL — ABNORMAL LOW (ref 78.0–100.0)
Platelets: 278 10*3/uL (ref 150–400)
RBC: 5.52 MIL/uL — ABNORMAL HIGH (ref 3.87–5.11)
RDW: 16 % — ABNORMAL HIGH (ref 11.5–15.5)
WBC: 6.3 10*3/uL (ref 4.0–10.5)

## 2014-10-25 LAB — NO BLOOD PRODUCTS

## 2014-10-25 NOTE — Patient Instructions (Signed)
Angela [name]  10/25/2014   Your procedure is scheduled on: Tuesday November 01, 2014   Report to [facility]  Entrance take [place]  elevators to 3rd floor to  [place] at 5:30 AM.  Call this number if you have problems the morning of surgery [phone]   Remember: ONLY 1 PERSON MAY GO WITH YOU TO SHORT STAY TO GET  READY MORNING OF [place].  Do not eat food or drink liquids :After Midnight.     Take these medicines the morning of surgery with A SIP OF WATER: Levothyroxine; Loratadine (Claritin) if needed; Sertraline (Zoloft); Alprazalam (Xanax) if needed; May use Nasacort nasal spray if needed (bring with you day of surgery); May use Albuterol Inhaler if needed (bring with you day of surgery)                BRING CPAP MASK AND TUBING WITH YOU DAY OF SURGERY                               You may not have any metal on your body including hair pins and              piercings  Do not wear jewelry, make-up, lotions, powders or perfumes, deodorant             Do not wear nail polish.  Do not shave  48 hours prior to surgery.                Do not bring valuables to the hospital. [place].  Contacts, dentures or bridgework may not be worn into surgery.  Leave suitcase in the car. After surgery it may be brought to your room.   _____________________________________________________________________             [facility] - Preparing for Surgery Before surgery, you can play an important role.  Because skin is not sterile, your skin needs to be as free of germs as possible.  You can reduce the number of germs on your skin by washing with CHG (chlorahexidine gluconate) soap before surgery.  CHG is an antiseptic cleaner which kills germs and bonds with the skin to continue killing germs even after washing. Please DO NOT use if you have an allergy to CHG or antibacterial soaps.  If your skin becomes  reddened/irritated stop using the CHG and inform your nurse when you arrive at Short Stay. Do not shave (including legs and underarms) for at least 48 hours prior to the first CHG shower.  You may shave your face/neck. Please follow these instructions carefully:  1.  Shower with CHG Soap the night before surgery and the  morning of Surgery.  2.  If you choose to wash your hair, wash your hair first as usual with your  normal  shampoo.  3.  After you shampoo, rinse your hair and body thoroughly to remove the  shampoo.                           4.  Use CHG as you would any other liquid soap.  You can apply chg directly  to the skin and wash  Gently with a scrungie or clean washcloth.  5.  Apply the CHG Soap to your body ONLY FROM THE NECK DOWN.   Do not use on face/ open                           Wound or open sores. Avoid contact with eyes, ears mouth and genitals (private parts).                       Wash face,  Genitals (private parts) with your normal soap.             6.  Wash thoroughly, paying special attention to the area where your surgery  will be performed.  7.  Thoroughly rinse your body with warm water from the neck down.  8.  DO NOT shower/wash with your normal soap after using and rinsing off  the CHG Soap.                9.  Pat yourself dry with a clean towel.            10.  Wear clean pajamas.            11.  Place clean sheets on your bed the night of your first shower and do not  sleep with pets. Day of Surgery : Do not apply any lotions/deodorants the morning of surgery.  Please wear clean clothes to the hospital/surgery center.  FAILURE TO FOLLOW THESE INSTRUCTIONS MAY RESULT IN THE CANCELLATION OF YOUR SURGERY PATIENT SIGNATURE_________________________________  NURSE SIGNATURE__________________________________  ________________________________________________________________________

## 2014-10-25 NOTE — Progress Notes (Signed)
Contacted [facility] / Dr Mikle Bosworth office VM message left with Pam in regards to refusal of blood/blood product consent. Also faxed consent form to MD office. Faxed copy to [facility] also.

## 2014-10-31 MED ORDER — GENTAMICIN SULFATE 40 MG/ML IJ SOLN
380.0000 mg | INTRAVENOUS | Status: AC
  Administered 2014-11-01: 380 mg via INTRAVENOUS
  Filled 2014-10-31 (×2): qty 9.5

## 2014-10-31 NOTE — Anesthesia Preprocedure Evaluation (Addendum)
Anesthesia Evaluation  Patient identified by MRN, date of birth, ID band Patient awake    Reviewed: Allergy & Precautions, H&P , NPO status , Patient's Chart, lab work & pertinent test results  Airway Mallampati: III  TM Distance: >3 FB Neck ROM: full    Dental no notable dental hx. (+) Dental Advisory Given, Teeth Intact   Pulmonary asthma , sleep apnea and Continuous Positive Airway Pressure Ventilation ,    Pulmonary exam normal breath sounds clear to auscultation       Cardiovascular Exercise Tolerance: Good negative cardio ROS Normal cardiovascular exam Rhythm:regular Rate:Normal     Neuro/Psych Anxiety Depression negative neurological ROS  negative psych ROS   GI/Hepatic negative GI ROS, Neg liver ROS,   Endo/Other  Hypothyroidism Morbid obesity  Renal/GU negative Renal ROS  negative genitourinary   Musculoskeletal   Abdominal (+) + obese,   Peds  Hematology negative hematology ROS (+)   Anesthesia Other Findings   Reproductive/Obstetrics negative OB ROS                            Anesthesia Physical Anesthesia Plan  ASA: III  Anesthesia Plan: General   Post-op Pain Management:    Induction: Intravenous  Airway Management Planned: Oral ETT  Additional Equipment:   Intra-op Plan:   Post-operative Plan: Extubation in OR  Informed Consent: I have reviewed the patients History and Physical, chart, labs and discussed the procedure including the risks, benefits and alternatives for the proposed anesthesia with the patient or authorized representative who has indicated his/her understanding and acceptance.   Dental Advisory Given  Plan Discussed with: CRNA and Surgeon  Anesthesia Plan Comments:       Anesthesia Quick Evaluation

## 2014-10-31 NOTE — H&P (Signed)
History of Present Illness   Angela [name]' vaginal irritation, especially in the labia, is much better with Estrace cream. She is going to use it every once a week or once every 2 weeks. She still has the exposed sling, which is relatively asymptomatic. She has elected watchful waiting. If she becomes sexually active or things change in the future, she may change her mind.    Angela [name] on her last visit had some vaginal bleeding and discharge. She gets a pulling left groin discomfort as noted. She is not sexually active. She leaks with coughing and sneezing, but not bending and lifting. She wears 1 pad at night for enuresis and 1 pad during the day as noted. When I repeat her examination she had an erosion in the right and left as well. I could not feel or see sling underneath the sub-urethra.   Importantly, before her surgery she was using 3 heavy pads a day for mixed stress, urge incontinence. There is no question her incontinence has been worsening, and she is here to discuss urodynamics. I felt that there was more erosion on the left. She was not having vaginal pain.  Review of systems: No change in bowel or neurologic systems.   On urodynamics she did not void and was catheterized for 40 mL. Max capacity was 890 mL. Bladder was stable. She leaked a mild amount at 600 mL at 150 cmH2O. It was 94 cmH2O at 700 mL. With good cough she did not leak at lower volumes. During voluntary voiding she had difficulty generating a detrusor contraction and voiding in the last setting. She had a low pressure contraction not well sustained. She became lightheaded as noted. EMG activity was within normal limits. Her bladder neck descended approximately a centimeter. The details of the urodynamics are signed and dictated on the urodynamics sheet.    Past Medical History Problems  1. History of Asthma (J45.909) 2. History of esophageal reflux (Z87.19) 3. History of hypothyroidism (Z86.39) 4. History of sleep apnea  (Z87.09)  Surgical History Problems  1. History of Ear Surgery 2. History of Gynecologic Services  Current Meds 1. Claritin TABS;  Therapy: (Recorded:04Apr2014) to Recorded 2. Excedrin TABS;  Therapy: (Recorded:04Apr2014) to Recorded 3. Proventil HFA AERS;  Therapy: (Recorded:04Apr2014) to Recorded 4. Saline Nasal Spray SOLN;  Therapy: (Recorded:13Jul2016) to Recorded 5. Sertraline HCl - 100 MG Oral Tablet;  Therapy: (Recorded:13Jul2016) to Recorded 6. Synthroid TABS;  Therapy: (Recorded:04Apr2014) to Recorded  Allergies Medication  1. Aspirin TABS  Family History Problems  1. Family history of Breast Cancer : Maternal Aunt 2. Family history of Cancer : Father 3. Family history of Death In The Family Father : Father   father passed @ age 13cancer 4. Family history of Family Health Status Number Of Children   2 sons2 daughters 12. Family history of Hypertension : Mother  Social History Problems  1. Denied: History of Alcohol Use 2. Caffeine Use   2-3 drinks daily 3. Marital History - Divorced 4. Never A Smoker 5. Occupation:   unemployed 24. Denied: History of Tobacco Use  Assessment Assessed  1. Urge and stress incontinence (N39.46) 2. Enuresis, nocturnal only (N39.44)  Plan Enuresis, nocturnal only, Nocturia, Urge and stress incontinence, Vaginal pain, Weak urinary stream  1. Follow-up Schedule Surgery Office  Follow-up  Status: Complete  Done: [id number]  Discussion/Summary She leaks a small amount during the day with a hard cough. She might have uncommon urge incontinence. She leaks a little bit while she is  sleeping. She understands at baseline now she has mild mixed stress, urge incontinence.    Angela [name] and I spoke about removing the suburethral component and vaginal component of her sling. I stressed injury to other structures, including the bladder and urethra and sequela with fistula and surgical complications. We talked about the rare risk of  ureteral injury. We talked about reexposure rate and success rate and dyspareunia and worsening incontinence. I think the chance of mixed stress, urge incontinence returning and becoming more severe is at least 20%. Postoperative course described.  I drew her a picture and we talked about reconstructive surgery in detail. Pros, cons, general surgical and anesthetic risks, and other options including watchful waiting were discussed. She understands that surgery is successful in approximately 85% of cases. Failure rates and the risk of persistence/recurrence/and worsening of the problem were discussed. Surgical risks were described but not limited to the discussion of injury to neighboring structures including the bowel (with possible life-threatening sepsis and colostomy), bladder, urethra, vagina (all resulting in further surgery), and ureter (resulting in re-implantation). We talked about injury to nerves/soft tissue leading to debilitating and intractable pelvic, abdominal, and lower extremity pain syndromes and neuropathies. The risks of dyspareunia, vaginal narrowing and shortening with sequelae were discussed. The risk of persistent, de novo, or worsening incontinence/dysfunction was discussed. Bleeding risks, transfusion rates, and infection were discussed. The usual post-operative course was described. The patient understands that she might not reach her treatment goal and that she might be worse following surgery.  She would like to proceed with surgery. She is hoping to be sexually active in the future. I think she has made a good choice. Hopefully, she can reach her treatment goals, and not buy back her incontinence. The sling model was used to explain things.   After a thorough review of the management options for the patient's condition the patient  elected to proceed with surgical therapy as noted above. We have discussed the potential benefits and risks of the procedure, side effects of the  proposed treatment, the likelihood of the patient achieving the goals of the procedure, and any potential problems that might occur during the procedure or recuperation. Informed consent has been obtained.

## 2014-11-01 ENCOUNTER — Observation Stay (HOSPITAL_COMMUNITY)
Admission: RE | Admit: 2014-11-01 | Discharge: 2014-11-02 | Disposition: A | Discharge status: Home / Self Care | Payer: BLUE CROSS/BLUE SHIELD | Source: Ambulatory Visit | Attending: Urology | Admitting: Urology

## 2014-11-01 ENCOUNTER — Encounter (HOSPITAL_COMMUNITY): Payer: Self-pay | Admitting: General Practice

## 2014-11-01 ENCOUNTER — Ambulatory Visit (HOSPITAL_COMMUNITY): Payer: BLUE CROSS/BLUE SHIELD | Admitting: Anesthesiology

## 2014-11-01 ENCOUNTER — Encounter (HOSPITAL_COMMUNITY)
Admission: RE | Disposition: A | Discharge status: Home / Self Care | Payer: Self-pay | Source: Ambulatory Visit | Attending: Urology

## 2014-11-01 DIAGNOSIS — T83721A Exposure of implanted vaginal mesh and other prosthetic materials into vagina, initial encounter: Principal | ICD-10-CM | POA: Insufficient documentation

## 2014-11-01 DIAGNOSIS — F419 Anxiety disorder, unspecified: Secondary | ICD-10-CM | POA: Diagnosis not present

## 2014-11-01 DIAGNOSIS — F329 Major depressive disorder, single episode, unspecified: Secondary | ICD-10-CM | POA: Insufficient documentation

## 2014-11-01 DIAGNOSIS — N8110 Cystocele, unspecified: Secondary | ICD-10-CM | POA: Diagnosis not present

## 2014-11-01 DIAGNOSIS — Y762 Prosthetic and other implants, materials and accessory obstetric and gynecological devices associated with adverse incidents: Secondary | ICD-10-CM | POA: Insufficient documentation

## 2014-11-01 DIAGNOSIS — G4730 Sleep apnea, unspecified: Secondary | ICD-10-CM | POA: Diagnosis not present

## 2014-11-01 DIAGNOSIS — N3946 Mixed incontinence: Secondary | ICD-10-CM | POA: Insufficient documentation

## 2014-11-01 DIAGNOSIS — T83711A Erosion of implanted vaginal mesh and other prosthetic materials to surrounding organ or tissue, initial encounter: Secondary | ICD-10-CM | POA: Diagnosis not present

## 2014-11-01 DIAGNOSIS — J45909 Unspecified asthma, uncomplicated: Secondary | ICD-10-CM | POA: Insufficient documentation

## 2014-11-01 DIAGNOSIS — K219 Gastro-esophageal reflux disease without esophagitis: Secondary | ICD-10-CM | POA: Diagnosis not present

## 2014-11-01 DIAGNOSIS — Z886 Allergy status to analgesic agent status: Secondary | ICD-10-CM | POA: Insufficient documentation

## 2014-11-01 DIAGNOSIS — Z6839 Body mass index (BMI) 39.0-39.9, adult: Secondary | ICD-10-CM | POA: Insufficient documentation

## 2014-11-01 DIAGNOSIS — E039 Hypothyroidism, unspecified: Secondary | ICD-10-CM | POA: Diagnosis not present

## 2014-11-01 DIAGNOSIS — R351 Nocturia: Secondary | ICD-10-CM | POA: Diagnosis not present

## 2014-11-01 DIAGNOSIS — T83712A Erosion of implanted urethral mesh to surrounding organ or tissue, initial encounter: Secondary | ICD-10-CM | POA: Diagnosis present

## 2014-11-01 HISTORY — PX: PUBOVAGINAL SLING: SHX1035

## 2014-11-01 HISTORY — PX: CYSTOSCOPY WITH URETHROLYSIS: SHX5126

## 2014-11-01 SURGERY — CREATION, PUBOVAGINAL SLING
Anesthesia: General

## 2014-11-01 MED ORDER — LACTATED RINGERS IV SOLN
INTRAVENOUS | Status: DC
  Administered 2014-11-01: 1000 mL via INTRAVENOUS

## 2014-11-01 MED ORDER — ROCURONIUM BROMIDE 100 MG/10ML IV SOLN
INTRAVENOUS | Status: AC
  Filled 2014-11-01: qty 1

## 2014-11-01 MED ORDER — DEXAMETHASONE SODIUM PHOSPHATE 10 MG/ML IJ SOLN
INTRAMUSCULAR | Status: AC
  Filled 2014-11-01: qty 1

## 2014-11-01 MED ORDER — LIDOCAINE HCL (CARDIAC) 20 MG/ML IV SOLN
INTRAVENOUS | Status: DC | PRN
  Administered 2014-11-01: 75 mg via INTRAVENOUS

## 2014-11-01 MED ORDER — DOCUSATE SODIUM 100 MG PO CAPS
100.0000 mg | ORAL_CAPSULE | Freq: Two times a day (BID) | ORAL | Status: DC
  Administered 2014-11-01 – 2014-11-02 (×3): 100 mg via ORAL
  Filled 2014-11-01 (×5): qty 1

## 2014-11-01 MED ORDER — ACETAMINOPHEN 325 MG PO TABS: 650.0000 mg | ORAL_TABLET | ORAL | Status: DC | PRN

## 2014-11-01 MED ORDER — FENTANYL CITRATE (PF) 100 MCG/2ML IJ SOLN
INTRAMUSCULAR | Status: AC
  Filled 2014-11-01: qty 2

## 2014-11-01 MED ORDER — PHENOL 1.4 % MT LIQD
1.0000 | OROMUCOSAL | Status: DC | PRN
  Filled 2014-11-01: qty 177

## 2014-11-01 MED ORDER — DEXAMETHASONE SODIUM PHOSPHATE 10 MG/ML IJ SOLN
INTRAMUSCULAR | Status: DC | PRN
  Administered 2014-11-01: 10 mg via INTRAVENOUS

## 2014-11-01 MED ORDER — ONDANSETRON HCL 4 MG/2ML IJ SOLN: 4.0000 mg | INTRAMUSCULAR | Status: DC | PRN

## 2014-11-01 MED ORDER — METOCLOPRAMIDE HCL 5 MG/ML IJ SOLN
INTRAMUSCULAR | Status: DC | PRN
  Administered 2014-11-01: 5 mg via INTRAVENOUS

## 2014-11-01 MED ORDER — LACTATED RINGERS IV SOLN
INTRAVENOUS | Status: DC | PRN
  Administered 2014-11-01 (×4): via INTRAVENOUS

## 2014-11-01 MED ORDER — LIDOCAINE-EPINEPHRINE (PF) 1 %-1:200000 IJ SOLN
INTRAMUSCULAR | Status: AC
  Filled 2014-11-01: qty 30

## 2014-11-01 MED ORDER — LIDOCAINE HCL (CARDIAC) 20 MG/ML IV SOLN
INTRAVENOUS | Status: AC
Start: 2014-11-01 — End: 2014-11-01
  Filled 2014-11-01: qty 5

## 2014-11-01 MED ORDER — SODIUM CHLORIDE 0.9 % IR SOLN
Status: DC | PRN
  Administered 2014-11-01: 500 mL

## 2014-11-01 MED ORDER — ESTRADIOL 0.1 MG/GM VA CREA
TOPICAL_CREAM | VAGINAL | Status: DC | PRN
Start: 2014-11-01 — End: 2014-11-01
  Administered 2014-11-01: 1 via VAGINAL

## 2014-11-01 MED ORDER — DEXTROSE-NACL 5-0.45 % IV SOLN
INTRAVENOUS | Status: DC
  Administered 2014-11-01 – 2014-11-02 (×2): via INTRAVENOUS

## 2014-11-01 MED ORDER — MIDAZOLAM HCL 5 MG/5ML IJ SOLN
INTRAMUSCULAR | Status: DC | PRN
  Administered 2014-11-01: 0.5 mg via INTRAVENOUS
  Administered 2014-11-01: 1 mg via INTRAVENOUS
  Administered 2014-11-01: 0.5 mg via INTRAVENOUS

## 2014-11-01 MED ORDER — FENTANYL CITRATE (PF) 100 MCG/2ML IJ SOLN
25.0000 ug | INTRAMUSCULAR | Status: AC | PRN
  Administered 2014-11-01 (×6): 25 ug via INTRAVENOUS

## 2014-11-01 MED ORDER — GLYCOPYRROLATE 0.2 MG/ML IJ SOLN
INTRAMUSCULAR | Status: DC | PRN
  Administered 2014-11-01: .8 mg via INTRAVENOUS

## 2014-11-01 MED ORDER — ROCURONIUM BROMIDE 100 MG/10ML IV SOLN
INTRAVENOUS | Status: DC | PRN
  Administered 2014-11-01: 10 mg via INTRAVENOUS
  Administered 2014-11-01: 40 mg via INTRAVENOUS
  Administered 2014-11-01: 10 mg via INTRAVENOUS

## 2014-11-01 MED ORDER — MIDAZOLAM HCL 2 MG/2ML IJ SOLN
INTRAMUSCULAR | Status: AC
  Filled 2014-11-01: qty 4

## 2014-11-01 MED ORDER — ONDANSETRON HCL 4 MG/2ML IJ SOLN
INTRAMUSCULAR | Status: DC | PRN
  Administered 2014-11-01 (×2): 2 mg via INTRAVENOUS

## 2014-11-01 MED ORDER — FENTANYL CITRATE (PF) 250 MCG/5ML IJ SOLN
INTRAMUSCULAR | Status: AC
Start: 2014-11-01 — End: 2014-11-01
  Filled 2014-11-01: qty 25

## 2014-11-01 MED ORDER — HYDROCODONE-ACETAMINOPHEN 5-325 MG PO TABS
1.0000 | ORAL_TABLET | ORAL | Status: DC | PRN
  Administered 2014-11-01 (×2): 1 via ORAL
  Administered 2014-11-02 (×2): 2 via ORAL
  Filled 2014-11-01 (×2): qty 1
  Filled 2014-11-01 (×2): qty 2

## 2014-11-01 MED ORDER — SODIUM CHLORIDE 0.9 % IJ SOLN
INTRAMUSCULAR | Status: AC
  Filled 2014-11-01: qty 10

## 2014-11-01 MED ORDER — ESTRADIOL 0.1 MG/GM VA CREA
TOPICAL_CREAM | VAGINAL | Status: AC
  Filled 2014-11-01: qty 42.5

## 2014-11-01 MED ORDER — OXYBUTYNIN CHLORIDE 5 MG PO TABS: 5.0000 mg | ORAL_TABLET | Freq: Three times a day (TID) | ORAL | Status: DC | PRN

## 2014-11-01 MED ORDER — FENTANYL CITRATE (PF) 100 MCG/2ML IJ SOLN
INTRAMUSCULAR | Status: DC | PRN
  Administered 2014-11-01: 100 ug via INTRAVENOUS
  Administered 2014-11-01 (×3): 50 ug via INTRAVENOUS

## 2014-11-01 MED ORDER — NEOSTIGMINE METHYLSULFATE 10 MG/10ML IV SOLN
INTRAVENOUS | Status: DC | PRN
  Administered 2014-11-01: 5 mg via INTRAVENOUS

## 2014-11-01 MED ORDER — AMPICILLIN-SULBACTAM SODIUM 1.5 (1-0.5) G IJ SOLR
1.5000 g | INTRAMUSCULAR | Status: AC
  Administered 2014-11-01: 1.5 g via INTRAVENOUS
  Filled 2014-11-01: qty 1.5

## 2014-11-01 MED ORDER — ONDANSETRON HCL 4 MG/2ML IJ SOLN
INTRAMUSCULAR | Status: AC
  Filled 2014-11-01: qty 2

## 2014-11-01 MED ORDER — PROPOFOL 10 MG/ML IV BOLUS
INTRAVENOUS | Status: DC | PRN
  Administered 2014-11-01: 50 mg via INTRAVENOUS
  Administered 2014-11-01: 150 mg via INTRAVENOUS

## 2014-11-01 MED ORDER — SUCCINYLCHOLINE CHLORIDE 20 MG/ML IJ SOLN
INTRAMUSCULAR | Status: DC | PRN
  Administered 2014-11-01: 100 mg via INTRAVENOUS
  Administered 2014-11-01: 40 mg via INTRAVENOUS

## 2014-11-01 MED ORDER — EPHEDRINE SULFATE 50 MG/ML IJ SOLN
INTRAMUSCULAR | Status: DC | PRN
  Administered 2014-11-01: 5 mg via INTRAVENOUS

## 2014-11-01 MED ORDER — SENNA 8.6 MG PO TABS
1.0000 | ORAL_TABLET | Freq: Two times a day (BID) | ORAL | Status: DC
  Administered 2014-11-01 – 2014-11-02 (×3): 8.6 mg via ORAL
  Filled 2014-11-01 (×3): qty 1

## 2014-11-01 MED ORDER — HYDROCODONE-ACETAMINOPHEN 5-325 MG PO TABS: 1.0000 | ORAL_TABLET | Freq: Four times a day (QID) | ORAL | Status: DC | PRN

## 2014-11-01 MED ORDER — SODIUM CHLORIDE 0.9 % IR SOLN
Status: AC
  Filled 2014-11-01: qty 1

## 2014-11-01 MED ORDER — MORPHINE SULFATE (PF) 2 MG/ML IV SOLN
2.0000 mg | INTRAVENOUS | Status: DC | PRN
  Administered 2014-11-01 – 2014-11-02 (×2): 2 mg via INTRAVENOUS
  Filled 2014-11-01 (×2): qty 1

## 2014-11-01 MED ORDER — PROPOFOL 10 MG/ML IV BOLUS
INTRAVENOUS | Status: AC
  Filled 2014-11-01: qty 20

## 2014-11-01 MED ORDER — SODIUM CHLORIDE 0.9 % IR SOLN
Status: DC | PRN
  Administered 2014-11-01: 1

## 2014-11-01 MED ORDER — LACTATED RINGERS IV SOLN: INTRAVENOUS | Status: DC

## 2014-11-01 SURGICAL SUPPLY — 49 items
BAG URINE DRAINAGE (UROLOGICAL SUPPLIES) ×2 IMPLANT
BLADE HEX COATED 2.75 (ELECTRODE) ×2 IMPLANT
BLADE SURG 15 STRL LF DISP TIS (BLADE) ×3 IMPLANT
BLADE SURG 15 STRL SS (BLADE) ×3
CATH FOLEY 2WAY SLVR  5CC 14FR (CATHETERS) ×1
CATH FOLEY 2WAY SLVR  5CC 16FR (CATHETERS)
CATH FOLEY 2WAY SLVR 5CC 14FR (CATHETERS) ×1 IMPLANT
CATH FOLEY 2WAY SLVR 5CC 16FR (CATHETERS) IMPLANT
CLOTH BEACON ORANGE TIMEOUT ST (SAFETY) ×2 IMPLANT
COVER MAYO STAND STRL (DRAPES) IMPLANT
COVER SURGICAL LIGHT HANDLE (MISCELLANEOUS) ×2 IMPLANT
DECANTER SPIKE VIAL GLASS SM (MISCELLANEOUS) IMPLANT
DRAIN PENROSE 18X1/4 LTX STRL (WOUND CARE) ×2 IMPLANT
DRAPE SHEET LG 3/4 BI-LAMINATE (DRAPES) ×2 IMPLANT
DRAPE UTILITY XL STRL (DRAPES) ×2 IMPLANT
ELECT PENCIL ROCKER SW 15FT (MISCELLANEOUS) ×2 IMPLANT
ELECT REM PT RETURN 9FT ADLT (ELECTROSURGICAL) ×2
ELECTRODE REM PT RTRN 9FT ADLT (ELECTROSURGICAL) ×1 IMPLANT
GAUZE PACKING 2X5 YD STRL (GAUZE/BANDAGES/DRESSINGS) IMPLANT
GAUZE PACKING IODOFORM 2 (PACKING) ×2 IMPLANT
GAUZE SPONGE 4X4 16PLY XRAY LF (GAUZE/BANDAGES/DRESSINGS) ×8 IMPLANT
GLOVE BIOGEL M STRL SZ7.5 (GLOVE) ×6 IMPLANT
GLOVE ECLIPSE 8.0 STRL XLNG CF (GLOVE) ×4 IMPLANT
GOWN STRL REUS W/TWL XL LVL3 (GOWN DISPOSABLE) ×2 IMPLANT
HOLDER FOLEY CATH W/STRAP (MISCELLANEOUS) ×2 IMPLANT
KIT BASIN OR (CUSTOM PROCEDURE TRAY) ×2 IMPLANT
LIQUID BAND (GAUZE/BANDAGES/DRESSINGS) ×4 IMPLANT
NEEDLE HYPO 22GX1.5 SAFETY (NEEDLE) ×2 IMPLANT
NEEDLE MAYO 6 CRC TAPER PT (NEEDLE) ×2 IMPLANT
NEEDLE MAYO CATGUT SZ4 (NEEDLE) IMPLANT
PACK CYSTO (CUSTOM PROCEDURE TRAY) ×2 IMPLANT
PLUG CATH AND CAP STER (CATHETERS) ×2 IMPLANT
RETRACTOR STAY HOOK 5MM (MISCELLANEOUS) IMPLANT
SHEET LAVH (DRAPES) ×2 IMPLANT
SUT SILK 2 0 SH (SUTURE) ×4 IMPLANT
SUT SILK 3 0 SH CR/8 (SUTURE) ×2 IMPLANT
SUT VIC AB 0 CT1 27 (SUTURE) ×1
SUT VIC AB 0 CT1 27XBRD ANTBC (SUTURE) ×1 IMPLANT
SUT VIC AB 2-0 CT1 27 (SUTURE) ×1
SUT VIC AB 2-0 CT1 27XBRD (SUTURE) ×1 IMPLANT
SUT VIC AB 2-0 SH 27 (SUTURE) ×6
SUT VIC AB 2-0 SH 27X BRD (SUTURE) ×6 IMPLANT
SUT VIC AB 3-0 SH 27 (SUTURE) ×4
SUT VIC AB 3-0 SH 27XBRD (SUTURE) ×4 IMPLANT
SUT VICRYL 0 UR6 27IN ABS (SUTURE) ×4 IMPLANT
TOWEL OR NON WOVEN STRL DISP B (DISPOSABLE) ×2 IMPLANT
WATER STERILE IRR 1000ML POUR (IV SOLUTION) ×2 IMPLANT
WATER STERILE IRR 1500ML POUR (IV SOLUTION) ×2 IMPLANT
YANKAUER SUCT BULB TIP 10FT TU (MISCELLANEOUS) ×2 IMPLANT

## 2014-11-01 NOTE — Anesthesia Postprocedure Evaluation (Signed)
  Anesthesia Post-op Note  Patient: Angela [name]  Procedure(s) Performed: Procedure(s) (LRB): REMOVAL VAGINAL SLING AND URETHROLYSIS AND CYSTO (N/A) CYSTOSCOPY WITH URETHROLYSIS (N/A)  Patient Location: PACU  Anesthesia Type: General  Level of Consciousness: awake and alert   Airway and Oxygen Therapy: Patient Spontanous Breathing  Post-op Pain: mild  Post-op Assessment: Post-op Vital signs reviewed, Patient's Cardiovascular Status Stable, Respiratory Function Stable, Patent Airway and No signs of Nausea or vomiting  Last Vitals:  Filed Vitals:   11/01/14 1137  BP:   Pulse: 59  Temp:   Resp: 13    Post-op Vital Signs: stable   Complications: No apparent anesthesia complications

## 2014-11-01 NOTE — Transfer of Care (Signed)
Immediate Anesthesia Transfer of Care Note  Patient: Angela [name]  Procedure(s) Performed: Procedure(s): REMOVAL VAGINAL SLING AND URETHROLYSIS AND CYSTO (N/A) CYSTOSCOPY WITH URETHROLYSIS (N/A)  Patient Location: PACU  Anesthesia Type:General  Level of Consciousness: awake, oriented, patient cooperative, lethargic and responds to stimulation  Airway & Oxygen Therapy: Patient Spontanous Breathing and Patient connected to face mask oxygen  Post-op Assessment: Report given to RN, Post -op Vital signs reviewed and stable and Patient moving all extremities  Post vital signs: Reviewed and stable  Last Vitals:  Filed Vitals:   11/01/14 1100  BP: 124/65  Pulse: 74  Temp:   Resp: 14    Complications: No apparent anesthesia complications

## 2014-11-01 NOTE — Op Note (Signed)
Preoperative diagnosis: Vaginal wall mesh extrusion Postoperative diagnosis: Vaginal wall mesh extrusion Surgery: Removal of extruded vaginal wall mesh and urethral lysis and cystoscopy Surgeon: Dr. Nicki Reaper Kyannah Climer Assistant: Dr. Nyoka Cowden  The patient has the above diagnoses and consented to the above procedure. Extra care was taken with leg positioning to minimize the risk of compartment syndrome and neuropathy and deep vein thrombosis. Preoperative antibodies were given. Double-ring retractor was used for exposure  I initially cystoscoped the patient. She had a modest cystocele. There was no cystitis or foreign body. The ureteral orifices were identified. There was no mesh in her shorter urethra  She had very high urethral vesical angles and that on top of the cystocele it would've made an inverted U-shaped flap difficult. I could feel and see the mesh at the pelvic sidewall on the left and right side already extruded. I could feel it underneath the urethra. I made a left to right transverse incision over the mesh first in the medial aspect of the vaginal wall and made a flap superiorly and inferiorly using silk stay sutures. This eventually was carried over the mesh to both sides of the vagina.  I passed a right angle around the mesh near the interface of the left and right sidewall. The mesh still was a little bit difficult to cleanly delineate so I decided to mobilize the mesh in the midline. With careful sharp dissection with a scalpel I incised over the mesh in the midline. I passed a right angle. I divided the sling between the right angle. I placed a silk suture through the mesh and sharply excised the mesh from medial to lateral bilaterally. It was more difficult on the left and was almost like she had two mesh in terms of the orientation. Having said that I believe there was only one. A lot of careful dissection and mobilization was utilized to carefully remove the mesh by dissecting along the  mesh and preserving the urethra and bladder neck and bladder. I finally excised the mesh leaving a nubbin of mesh deep in the pelvis that would be easily covered by the vaginal wall mucosa. The same dissection was done on her right side though it was much easier.  Careful inspection identified no residual mesh near the closure line. Copius irrigation was utilized.  I re-cystoscoped the patient there was no injury to the urethra or bladder or bladder neck  I was very careful in getting a good closure of now what amounted to a shallow inverted U-shaped incision. Reinspection of suture line demonstrated good closure. Vaginal pack with Estrace cream was inserted  Hopefully the procedure will reach the patient's treatment goal

## 2014-11-01 NOTE — Interval H&P Note (Signed)
History and Physical Interval Note:  11/01/2014 7:20 AM  Angela [name]  has presented today for surgery, with the diagnosis of EROTIC VAGINAL SLING  The various methods of treatment have been discussed with the patient and family. After consideration of risks, benefits and other options for treatment, the patient has consented to  Procedure(s): REMOVAL VAGINAL SLING AND URETHROLYSIS AND CYSTO (N/A) CYSTOSCOPY WITH URETHROLYSIS (N/A) as a surgical intervention .  The patient's history has been reviewed, patient examined, no change in status, stable for surgery.  I have reviewed the patient's chart and labs.  Questions were answered to the patient's satisfaction.     [name]

## 2014-11-01 NOTE — Interval H&P Note (Signed)
History and Physical Interval Note:  11/01/2014 7:20 AM  Angela [name]  has presented today for surgery, with the diagnosis of EROTIC VAGINAL SLING  The various methods of treatment have been discussed with the patient and family. After consideration of risks, benefits and other options for treatment, the patient has consented to  Procedure(s): REMOVAL VAGINAL SLING AND URETHROLYSIS AND CYSTO (N/A) CYSTOSCOPY WITH URETHROLYSIS (N/A) as a surgical intervention .  The patient's history has been reviewed, patient examined, no change in status, stable for surgery.  I have reviewed the patient's chart and labs.  Questions were answered to the patient's satisfaction.     Veryl Winemiller A

## 2014-11-01 NOTE — Anesthesia Procedure Notes (Addendum)
Procedure Name: Intubation Date/Time: 11/01/2014 7:47 AM Performed by: Ofilia Neas Pre-anesthesia Checklist: Patient identified, Timeout performed, Emergency Drugs available, Suction available and Patient being monitored Patient Re-evaluated:Patient Re-evaluated prior to inductionOxygen Delivery Method: Circle system utilized Preoxygenation: Pre-oxygenation with 100% oxygen Intubation Type: IV induction Ventilation: Mask ventilation with difficulty Laryngoscope Size: Mac and 4 Grade View: Grade III Tube type: Oral Tube size: 7.5 mm Number of attempts: 2 Airway Equipment and Method: Bougie stylet Placement Confirmation: positive ETCO2 and breath sounds checked- equal and bilateral Secured at: 22 cm Tube secured with: Tape Difficulty Due To: Difficulty was anticipated, Difficult Airway- due to large tongue, Difficult Airway- due to reduced neck mobility and Difficult Airway- due to anterior larynx Future Recommendations: Recommend- induction with short-acting agent, and alternative techniques readily available Comments: Intubated on second attempt, first DL unable to expose cords with head lift/cricoid pressure and Mac 4.  Very large tongue, tonsils, limited opening, poor neck mobility.  Second DL bougee stylet passed under epiglottis, ETT over.   Successfully intubated, equal BBS, positive ETCO2 but cuff rupture therefore ETT changed over bougee.  Equal BBS, positive ETCO2.  Initial small amount of heme (dark) from ETT which stopped spontaneously with no change in BBS or airway pressures.

## 2014-11-01 NOTE — Procedures (Signed)
RT set up hospital CPAP machine, with EPAP set to 5 per pt's home setting, with pt's own tubing and nasal pillows.  Pt states that she will placed herself on machine when ready.  Pt instructed to call RT if assistance is needed.

## 2014-11-02 DIAGNOSIS — T83721A Exposure of implanted vaginal mesh and other prosthetic materials into vagina, initial encounter: Secondary | ICD-10-CM | POA: Diagnosis not present

## 2014-11-02 LAB — HEMOGLOBIN AND HEMATOCRIT, BLOOD
HCT: 35.9 % — ABNORMAL LOW (ref 36.0–46.0)
Hemoglobin: 11.7 g/dL — ABNORMAL LOW (ref 12.0–15.0)

## 2014-11-02 MED ORDER — INFLUENZA VAC SPLIT QUAD 0.5 ML IM SUSY
0.5000 mL | PREFILLED_SYRINGE | Freq: Once | INTRAMUSCULAR | Status: AC
  Administered 2014-11-02: 0.5 mL via INTRAMUSCULAR
  Filled 2014-11-02: qty 0.5

## 2014-11-02 MED ORDER — INFLUENZA VAC SPLIT QUAD 0.5 ML IM SUSY: 0.5000 mL | PREFILLED_SYRINGE | INTRAMUSCULAR | Status: DC

## 2014-11-02 NOTE — Discharge Summary (Signed)
Date of admission: 11/01/2014  Date of discharge: 11/02/2014  Admission diagnosis: Eroded sling  Discharge diagnosis: Eroded sling in vagina  Secondary diagnoses: Incontinence  History and Physical: For full details, please see admission history and physical. Briefly, Angela [name] is a 59 y.o. year old patient with the above diagnosis.   Hospital Course: Removal of vaginal wall mesh   Laboratory values:  Recent Labs  11/02/14 0510  HGB 11.7*  HCT 35.9*   No results for input(s): CREATININE in the last 72 hours.  Disposition: Home  Discharge instruction: The patient was instructed to be ambulatory but told to refrain from heavy lifting, strenuous activity, or driving. Detailed  Discharge medications:    Medication List    TAKE these medications        albuterol 108 (90 BASE) MCG/ACT inhaler  Commonly known as:  PROVENTIL HFA  Inhale 2 puffs into the lungs every 6 (six) hours as needed for wheezing.     ALPRAZolam 0.25 MG tablet  Commonly known as:  XANAX  Take 0.25 mg by mouth daily.     HYDROcodone-acetaminophen 5-325 MG tablet  Commonly known as:  NORCO/VICODIN  Take 1 tablet by mouth every 6 (six) hours as needed for moderate pain.     levothyroxine 112 MCG tablet  Commonly known as:  SYNTHROID, LEVOTHROID  Take 1 tablet (112 mcg total) by mouth daily.     loratadine 10 MG tablet  Commonly known as:  CLARITIN  Take 10 mg by mouth daily as needed for allergies.     multivitamin tablet  Take 1 tablet by mouth daily.     NASACORT AQ 55 MCG/ACT Aero nasal inhaler  Generic drug:  triamcinolone  Place 2 sprays into the nose as needed (allergies).     sertraline 100 MG tablet  Commonly known as:  ZOLOFT  Take 100 mg by mouth daily.        Followup:      Follow-up Information    Follow up with Zitlaly Malson A, MD.   Specialty:  Urology   Why:  as scheduled   Contact information:   [place] [address] [phone]

## 2014-11-02 NOTE — Discharge Instructions (Signed)
I have reviewed discharge instructions in detail with the patient. They will follow-up with me or their physician as scheduled. My nurse will also be calling the patients as per protocol.   

## 2014-11-02 NOTE — Care Management Note (Signed)
Case Management Note  Patient Details  Name: Angela [name] MRN: [id number] Date of Birth: 1955/07/11  Subjective/Objective:59 y/o f admitted w/Erosion of suburethral sling.From home.                    Action/Plan:d/c home no needs or orders.  Expected Discharge Date:                  Expected Discharge Plan:  Home/Self Care  In-House Referral:     Discharge planning Services  CM Consult  Post Acute Care Choice:    Choice offered to:     DME Arranged:    DME Agency:     HH Arranged:    [place] Agency:     Status of Service:  Completed, signed off  Medicare Important Message Given:    Date Medicare IM Given:    Medicare IM give by:    Date Additional Medicare IM Given:    Additional Medicare Important Message give by:     If discussed at [place] of Stay Meetings, dates discussed:    Additional Comments:  Dessa Phi, RN 11/02/2014, 10:17 AM

## 2014-11-02 NOTE — Progress Notes (Signed)
Dr Matilde Sprang notified of PVR. MD stated  OK for pt to be discharged.

## 2014-11-22 ENCOUNTER — Other Ambulatory Visit: Payer: Self-pay

## 2014-11-22 DIAGNOSIS — Z1231 Encounter for screening mammogram for malignant neoplasm of breast: Secondary | ICD-10-CM

## 2014-12-01 ENCOUNTER — Ambulatory Visit: Payer: No Typology Code available for payment source

## 2014-12-15 ENCOUNTER — Ambulatory Visit
Admission: RE | Admit: 2014-12-15 | Discharge: 2014-12-15 | Disposition: A | Discharge status: Home / Self Care | Payer: BLUE CROSS/BLUE SHIELD | Source: Ambulatory Visit

## 2014-12-15 DIAGNOSIS — Z1231 Encounter for screening mammogram for malignant neoplasm of breast: Secondary | ICD-10-CM

## 2015-02-23 ENCOUNTER — Ambulatory Visit: Payer: Self-pay | Admitting: Allergy and Immunology

## 2015-03-02 ENCOUNTER — Encounter: Payer: Self-pay | Admitting: Allergy and Immunology

## 2015-03-02 ENCOUNTER — Ambulatory Visit (INDEPENDENT_AMBULATORY_CARE_PROVIDER_SITE_OTHER): Payer: Managed Care, Other (non HMO) | Admitting: Allergy and Immunology

## 2015-03-02 VITALS — BP 136/84 | HR 84 | Temp 98.4°F | Resp 12 | Ht 64.96 in | Wt 243.2 lb

## 2015-03-02 DIAGNOSIS — R062 Wheezing: Secondary | ICD-10-CM

## 2015-03-02 DIAGNOSIS — R05 Cough: Secondary | ICD-10-CM | POA: Diagnosis not present

## 2015-03-02 DIAGNOSIS — J310 Chronic rhinitis: Secondary | ICD-10-CM

## 2015-03-02 DIAGNOSIS — R059 Cough, unspecified: Secondary | ICD-10-CM

## 2015-03-02 MED ORDER — BECLOMETHASONE DIPROPIONATE 80 MCG/ACT IN AERS: 2.0000 | INHALATION_SPRAY | Freq: Two times a day (BID) | RESPIRATORY_TRACT | Status: DC

## 2015-03-02 MED ORDER — ALBUTEROL SULFATE 108 (90 BASE) MCG/ACT IN AEPB: 2.0000 | INHALATION_SPRAY | RESPIRATORY_TRACT | Status: DC | PRN

## 2015-03-02 MED ORDER — OLOPATADINE HCL 0.6 % NA SOLN: NASAL | Status: DC

## 2015-03-02 NOTE — Patient Instructions (Addendum)
Take Home Sheet  1. Avoidance: Mite and Mold   2. Antihistamine: Loratadine 10mg  by mouth once daily for runny nose or itching.      Stop Claritin D  3. Nasal Spray: Nasacort 1-2 spray(s) each nostril once daily for stuffy nose or drainage.     If persisting congestion add Patanase 1-2 sprays once each evening.  4. Inhalers:  Rescue: ProAir respiclick 2 puffs every 4 hours as needed for cough or wheeze.       -May use 2 puffs 10-20 minutes prior to exercise.   Preventative: QVAR 29mcg  2 puffs twice daily (Rinse, gargle, and spit out after use).    5. Prednisone 30mg  now.  6. Nasal Saline wash each evening at bath or shower time.   7. Follow up Visit:  6 weeks or sooner if needed.   Websites that have reliable Patient information: 1. American Academy of Asthma, Allergy, & Immunology: www.aaaai.org 2. Food Allergy Network: www.foodallergy.org 3. Mothers of Asthmatics: www.aanma.org 4. [place]: DiningCalendar.de 5. American College of Allergy, Asthma, & Immunology: https://robertson.info/ or www.acaai.org

## 2015-03-02 NOTE — Progress Notes (Signed)
NEW PATIENT NOTE  RE: Angela [name] MRN: [id number] DOB: 1955/07/04 [facility] Time [address] [address] Date of Office Visit: 03/02/2015  Dear L.Donnie Coffin, MD:  I had the pleasure of seeing Angela [name] today in initial evaluation as you recall-- Subjective:  Angela [name] is a 60 y.o. female who presents today for Cough and Wheezing  Assessment:   1. Previous history of cough and wheeze consistent with persistent asthma.    2. History of sleep apnea with CPAP.    3. Mixed rhinitis, minimal aeroallergen hypersensitivity.    4.      History of intermittent reflux, improved with avoidance of late night meals. 5.      Complex medical history. Plan:   Meds ordered this encounter  Medications  . Olopatadine HCl 0.6 % SOLN    Sig: 1-2 sprays in each nostril each evening as needed for congestion.    Dispense:  1 Bottle    Refill:  5  . Albuterol Sulfate (PROAIR RESPICLICK) 123XX123 (90 Base) MCG/ACT AEPB    Sig: Inhale 2 puffs into the lungs every 4 (four) hours as needed.    Dispense:  1 each    Refill:  1  . beclomethasone (QVAR) 80 MCG/ACT inhaler    Sig: Inhale 2 puffs into the lungs 2 (two) times daily. To prevent cough or wheeze.  Rinse, gargle and spit with water after use.    Dispense:  1 Inhaler    Refill:  2   Patient Instructions  1. Avoidance: Mite and Mold 2. Antihistamine: Loratadine 10mg  by mouth once daily for runny nose or itching.---Stop Claritin D 3. Nasal Spray: Nasacort 1-2 spray(s) each nostril once daily for stuffy nose or drainage.     If persisting congestion add Patanase 1-2 sprays once each evening. 4. Inhalers:  Rescue: ProAir respiclick 2 puffs every 4 hours as needed for cough or wheeze.       -May use 2 puffs 10-20 minutes prior to exercise.  Preventative: QVAR 59mcg  2 puffs twice daily (Rinse, gargle, and spit out after use). 5. Nasal Saline wash each evening at bath or shower time. 6. Follow up  Visit:  6 weeks or sooner if needed and call with any recurring ProAir use.   HPI: Angela [name] presents to the office with a history of asthma and mixed rhinitis evaluated in our office 2012 without further follow-up (no insurance for several years).  She reports increasing symptoms in the last year, in particular cough, wheeze, postnasal drip, rhinorrhea, nasal congestion, sneezing with intermittent chest congestion, whitish phlegm and occasional disruption of sleep.  She denies difficulty breathing, shortness of breath or chest tightness. She describes dust, pollen, outdoors and fluctuant weather patterns, as provoking factors to her symptoms, but no sensitivity to foods or history of sinus infections or bronchitis.  Recently her symptoms are daily and occasionally associated with exercise.  Last chest x-ray was 3 years ago.  No recent bronchodilator use.  Denies ED or Urgent care visits, recent prednisone or antibiotic courses.  Medical History: Past Medical History  Diagnosis Date  . Breast discharge   . Asthma   . Hyperlipidemia   . GERD (gastroesophageal reflux disease)   . Seizures ([place])     hx of during childhood -not since age 60  . Thygeson's disease   . Headache(784.0)   . Sleep apnea     uses CPAP  . Hypothyroidism   . Depression   .  Anxiety   . Tinnitus   . Congestion of right ear   . Urticaria    Surgical History: Past Surgical History  Procedure Laterality Date  . Anterior and posterior repair  2007  . Tubal ligation  1998  . Tympanostomy tube placement  2010    right ear   . Breast ductal system excision  01/18/2011    Procedure: EXCISION DUCTAL SYSTEM BREAST;  Surgeon: Belva Crome, MD;  Location: [facility];  Service: General;  Laterality: Right;  . Breast surgery  01/18/2011    Right BX  . Dilation and curettage of uterus    . Wisdom tooth extraction    . Pubovaginal sling N/A 11/01/2014    Procedure: REMOVAL VAGINAL SLING AND URETHROLYSIS AND  CYSTO;  Surgeon: Bjorn Loser, MD;  Location: [facility];  Service: Urology;  Laterality: N/A;  . Cystoscopy with urethrolysis N/A 11/01/2014    Procedure: CYSTOSCOPY WITH URETHROLYSIS;  Surgeon: Bjorn Loser, MD;  Location: [facility];  Service: Urology;  Laterality: N/A;   Family History: Family History  Problem Relation Age of Onset  . Cancer Father     throat  . Cancer Maternal Aunt     breast  . Allergic rhinitis Neg Hx   . Asthma Neg Hx   . Eczema Neg Hx   . Immunodeficiency Neg Hx   . Urticaria Neg Hx    Social History: Social History  . Marital Status: Single    Spouse Name: N/A  . Number of Children: 4  . Years of Education: N/A   Social History Main Topics  . Smoking status: Never Smoker   . Smokeless tobacco: Never Used  . Alcohol Use: No  . Drug Use: No  . 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  Angela [name] has a current medication list which includes the following prescription(s): levothyroxine, loratadine-pseudoephedrine, multivitamin, triamcinolone spray.   Drug Allergies: Allergies  Allergen Reactions  . Aspirin Other (See Comments)    Stomach ulcers   . Ibuprofen Other (See Comments)    Cautious by MD due to kidney function   . Other Other (See Comments)    NO BLOOD PRODUCTS - PT IN AGREEMENT FOR ALBUMIN OR ALBUMIN CONTAINING PRODUCTS PER CONSENT   Environmental History: Angela [name] lives in a 58 year old house 11.5 years with laminate floors, scattered carpet squares, stuffed mattress, non-feather pillow and comforter, central air and heat without humidifier pets or smokers.  Review of Systems  Constitutional: Negative for fever, weight loss and malaise/fatigue.  HENT: Positive for congestion. Negative for ear pain, hearing loss, nosebleeds and sore throat.   Eyes: Negative for discharge and redness.  Respiratory: Positive for cough. Negative for  shortness of breath.        See HPI.  Denies history of bronchitis and pneumonia.  Gastrointestinal: Positive for heartburn (improved with avoidance of late meals.). Negative for nausea, vomiting, abdominal pain, diarrhea and constipation.  Genitourinary: Negative.   Musculoskeletal: Negative for myalgias and joint pain.  Skin: Negative.  Negative for itching and rash.  Neurological: Negative.  Negative for dizziness, seizures, weakness and headaches.  Endo/Heme/Allergies: Positive for environmental allergies.       Thyroid disease. Denies sensitivity to aspirin, NSAIDs, stinging insects, foods, latex, jewelry and cosmetics.   Objective:   Filed Vitals:   03/02/15 1347  BP: 136/84  Pulse: 84  Temp: 98.4 F (36.9 C)  Resp: 12   SpO2 Readings from Last 1 Encounters:  03/02/15 98%   Physical Exam  Constitutional: She is well-developed, well-nourished, and in no distress.  HENT:  Head: Atraumatic.  Right Ear: Tympanic membrane and ear canal normal.  Left Ear: Tympanic membrane and ear canal normal.  Nose: Mucosal edema present. No rhinorrhea. No epistaxis.  Mouth/Throat: Oropharynx is clear and moist and mucous membranes are normal. No oropharyngeal exudate, posterior oropharyngeal edema or posterior oropharyngeal erythema.  Eyes: Conjunctivae are normal.  Neck: Neck supple.  Cardiovascular: Normal rate, S1 normal and S2 normal.   No murmur heard. Pulmonary/Chest: Effort normal. She has no wheezes. She has no rhonchi. She has no rales.  Abdominal: Soft. Normal appearance and bowel sounds are normal.  Musculoskeletal: She exhibits no edema.  Lymphadenopathy:    She has no cervical adenopathy.  Neurological: She is alert.  Skin: Skin is warm and intact. No rash noted. No cyanosis. Nails show no clubbing.   Diagnostics: Spirometry:  FVC 2.98--111%, FEV1 2.34--108%.  Postbronchodilator essentially no change.     Lavar Rosenzweig M. Ishmael Holter, MD    cc: Donnie Coffin, MD

## 2015-03-15 ENCOUNTER — Other Ambulatory Visit: Payer: Self-pay

## 2015-03-15 MED ORDER — ALBUTEROL SULFATE 108 (90 BASE) MCG/ACT IN AEPB: 2.0000 | INHALATION_SPRAY | RESPIRATORY_TRACT | Status: DC | PRN

## 2015-04-20 ENCOUNTER — Ambulatory Visit: Payer: Managed Care, Other (non HMO) | Admitting: Allergy and Immunology

## 2015-11-08 DIAGNOSIS — H6531 Chronic mucoid otitis media, right ear: Secondary | ICD-10-CM | POA: Insufficient documentation

## 2015-11-08 DIAGNOSIS — H9011 Conductive hearing loss, unilateral, right ear, with unrestricted hearing on the contralateral side: Secondary | ICD-10-CM | POA: Insufficient documentation

## 2015-11-20 ENCOUNTER — Other Ambulatory Visit: Payer: Self-pay | Admitting: Family Medicine

## 2015-11-20 DIAGNOSIS — Z1231 Encounter for screening mammogram for malignant neoplasm of breast: Secondary | ICD-10-CM

## 2016-01-08 ENCOUNTER — Other Ambulatory Visit: Payer: Self-pay | Admitting: Obstetrics and Gynecology

## 2016-01-08 DIAGNOSIS — Z1231 Encounter for screening mammogram for malignant neoplasm of breast: Secondary | ICD-10-CM

## 2016-01-12 ENCOUNTER — Ambulatory Visit
Admission: RE | Admit: 2016-01-12 | Discharge: 2016-01-12 | Disposition: A | Discharge status: Home / Self Care | Payer: BLUE CROSS/BLUE SHIELD | Source: Ambulatory Visit | Attending: Obstetrics and Gynecology | Admitting: Obstetrics and Gynecology

## 2016-01-12 DIAGNOSIS — Z1231 Encounter for screening mammogram for malignant neoplasm of breast: Secondary | ICD-10-CM

## 2016-01-15 ENCOUNTER — Other Ambulatory Visit: Payer: Self-pay | Admitting: *Deleted

## 2016-01-15 MED ORDER — BECLOMETHASONE DIPROPIONATE 80 MCG/ACT IN AERS: 2.0000 | INHALATION_SPRAY | Freq: Two times a day (BID) | RESPIRATORY_TRACT | 0 refills | Status: DC

## 2016-05-13 ENCOUNTER — Encounter (HOSPITAL_COMMUNITY): Payer: Self-pay | Admitting: *Deleted

## 2016-05-13 ENCOUNTER — Emergency Department (HOSPITAL_COMMUNITY): Payer: BLUE CROSS/BLUE SHIELD

## 2016-05-13 ENCOUNTER — Observation Stay (HOSPITAL_COMMUNITY)
Admission: EM | Admit: 2016-05-13 | Discharge: 2016-05-14 | Disposition: A | Discharge status: Home / Self Care | Payer: BLUE CROSS/BLUE SHIELD | Attending: Internal Medicine | Admitting: Internal Medicine

## 2016-05-13 DIAGNOSIS — E039 Hypothyroidism, unspecified: Secondary | ICD-10-CM | POA: Insufficient documentation

## 2016-05-13 DIAGNOSIS — J45901 Unspecified asthma with (acute) exacerbation: Secondary | ICD-10-CM | POA: Diagnosis not present

## 2016-05-13 DIAGNOSIS — F329 Major depressive disorder, single episode, unspecified: Secondary | ICD-10-CM

## 2016-05-13 DIAGNOSIS — R0602 Shortness of breath: Secondary | ICD-10-CM

## 2016-05-13 DIAGNOSIS — Z79899 Other long term (current) drug therapy: Secondary | ICD-10-CM | POA: Insufficient documentation

## 2016-05-13 DIAGNOSIS — F32A Depression, unspecified: Secondary | ICD-10-CM

## 2016-05-13 DIAGNOSIS — F419 Anxiety disorder, unspecified: Secondary | ICD-10-CM

## 2016-05-13 LAB — COMPREHENSIVE METABOLIC PANEL
ALT: 23 U/L (ref 14–54)
AST: 23 U/L (ref 15–41)
Albumin: 4.4 g/dL (ref 3.5–5.0)
Alkaline Phosphatase: 69 U/L (ref 38–126)
Anion gap: 10 (ref 5–15)
BUN: 7 mg/dL (ref 6–20)
CO2: 26 mmol/L (ref 22–32)
Calcium: 8.7 mg/dL — ABNORMAL LOW (ref 8.9–10.3)
Chloride: 106 mmol/L (ref 101–111)
Creatinine, Ser: 0.88 mg/dL (ref 0.44–1.00)
GFR calc Af Amer: >60 mL/min (ref >60)
GFR calc non Af Amer: >60 mL/min (ref >60)
Glucose, Bld: 119 mg/dL — ABNORMAL HIGH (ref 65–99)
Potassium: 3.1 mmol/L — ABNORMAL LOW (ref 3.5–5.1)
Sodium: 142 mmol/L (ref 135–145)
Total Bilirubin: 0.6 mg/dL (ref 0.3–1.2)
Total Protein: 8.8 g/dL — ABNORMAL HIGH (ref 6.5–8.1)

## 2016-05-13 LAB — CBC WITH DIFFERENTIAL/PLATELET
Basophils Absolute: 0 10*3/uL (ref 0.0–0.1)
Basophils Relative: 0 %
Eosinophils Absolute: 0.3 10*3/uL (ref 0.0–0.7)
Eosinophils Relative: 4 %
HCT: 42.4 % (ref 36.0–46.0)
Hemoglobin: 14.1 g/dL (ref 12.0–15.0)
Lymphocytes Relative: 13 %
Lymphs Abs: 1.2 10*3/uL (ref 0.7–4.0)
MCH: 24.1 pg — ABNORMAL LOW (ref 26.0–34.0)
MCHC: 33.3 g/dL (ref 30.0–36.0)
MCV: 72.4 fL — ABNORMAL LOW (ref 78.0–100.0)
Monocytes Absolute: 0.2 10*3/uL (ref 0.1–1.0)
Monocytes Relative: 2 %
Neutro Abs: 7.1 10*3/uL (ref 1.7–7.7)
Neutrophils Relative %: 81 %
Platelets: 229 10*3/uL (ref 150–400)
RBC: 5.86 MIL/uL — ABNORMAL HIGH (ref 3.87–5.11)
RDW: 15.9 % — ABNORMAL HIGH (ref 11.5–15.5)
WBC: 8.7 10*3/uL (ref 4.0–10.5)

## 2016-05-13 LAB — CBC
HCT: 41.6 % (ref 36.0–46.0)
Hemoglobin: 13.8 g/dL (ref 12.0–15.0)
MCH: 23.8 pg — ABNORMAL LOW (ref 26.0–34.0)
MCHC: 33.2 g/dL (ref 30.0–36.0)
MCV: 71.8 fL — ABNORMAL LOW (ref 78.0–100.0)
Platelets: 277 10*3/uL (ref 150–400)
RBC: 5.79 MIL/uL — ABNORMAL HIGH (ref 3.87–5.11)
RDW: 15.9 % — ABNORMAL HIGH (ref 11.5–15.5)
WBC: 10.6 10*3/uL — ABNORMAL HIGH (ref 4.0–10.5)

## 2016-05-13 LAB — CREATININE, SERUM
Creatinine, Ser: 0.91 mg/dL (ref 0.44–1.00)
GFR calc Af Amer: >60 mL/min (ref >60)
GFR calc non Af Amer: >60 mL/min (ref >60)

## 2016-05-13 MED ORDER — ONDANSETRON HCL 4 MG/2ML IJ SOLN: 4.0000 mg | Freq: Four times a day (QID) | INTRAMUSCULAR | Status: DC | PRN

## 2016-05-13 MED ORDER — ADULT MULTIVITAMIN W/MINERALS CH
1.0000 | ORAL_TABLET | Freq: Every day | ORAL | Status: DC
  Administered 2016-05-14: 1 via ORAL
  Filled 2016-05-13: qty 1

## 2016-05-13 MED ORDER — IPRATROPIUM BROMIDE 0.02 % IN SOLN
0.5000 mg | Freq: Once | RESPIRATORY_TRACT | Status: AC
  Administered 2016-05-13: 0.5 mg via RESPIRATORY_TRACT
  Filled 2016-05-13: qty 2.5

## 2016-05-13 MED ORDER — METHYLPREDNISOLONE SODIUM SUCC 125 MG IJ SOLR
60.0000 mg | Freq: Four times a day (QID) | INTRAMUSCULAR | Status: DC
  Administered 2016-05-13 – 2016-05-14 (×4): 60 mg via INTRAVENOUS
  Filled 2016-05-13 (×4): qty 2

## 2016-05-13 MED ORDER — BECLOMETHASONE DIPROPIONATE 80 MCG/ACT IN AERS: 2.0000 | INHALATION_SPRAY | Freq: Two times a day (BID) | RESPIRATORY_TRACT | Status: DC

## 2016-05-13 MED ORDER — TRIAMCINOLONE ACETONIDE 55 MCG/ACT NA AERO: 2.0000 | INHALATION_SPRAY | NASAL | Status: DC | PRN

## 2016-05-13 MED ORDER — ALBUTEROL SULFATE (2.5 MG/3ML) 0.083% IN NEBU
2.5000 mg | INHALATION_SOLUTION | Freq: Four times a day (QID) | RESPIRATORY_TRACT | Status: DC
  Administered 2016-05-13: 2.5 mg via RESPIRATORY_TRACT
  Filled 2016-05-13: qty 3

## 2016-05-13 MED ORDER — LORATADINE 10 MG PO TABS: 10.0000 mg | ORAL_TABLET | Freq: Every day | ORAL | Status: DC | PRN

## 2016-05-13 MED ORDER — MAGNESIUM SULFATE 2 GM/50ML IV SOLN
2.0000 g | Freq: Once | INTRAVENOUS | Status: AC
  Administered 2016-05-13: 2 g via INTRAVENOUS
  Filled 2016-05-13: qty 50

## 2016-05-13 MED ORDER — BUDESONIDE 0.25 MG/2ML IN SUSP
0.2500 mg | Freq: Two times a day (BID) | RESPIRATORY_TRACT | Status: DC
  Administered 2016-05-13 – 2016-05-14 (×2): 0.25 mg via RESPIRATORY_TRACT
  Filled 2016-05-13 (×3): qty 2

## 2016-05-13 MED ORDER — HYDROCODONE-ACETAMINOPHEN 5-325 MG PO TABS
1.0000 | ORAL_TABLET | Freq: Four times a day (QID) | ORAL | Status: DC | PRN
  Administered 2016-05-14: 1 via ORAL
  Filled 2016-05-13: qty 1

## 2016-05-13 MED ORDER — ACETAMINOPHEN 325 MG PO TABS
650.0000 mg | ORAL_TABLET | Freq: Four times a day (QID) | ORAL | Status: DC | PRN
  Administered 2016-05-14: 650 mg via ORAL
  Filled 2016-05-13: qty 2

## 2016-05-13 MED ORDER — LEVOFLOXACIN IN D5W 500 MG/100ML IV SOLN
500.0000 mg | INTRAVENOUS | Status: DC
  Administered 2016-05-13 – 2016-05-14 (×2): 500 mg via INTRAVENOUS
  Filled 2016-05-13 (×2): qty 100

## 2016-05-13 MED ORDER — ACETAMINOPHEN 650 MG RE SUPP: 650.0000 mg | Freq: Four times a day (QID) | RECTAL | Status: DC | PRN

## 2016-05-13 MED ORDER — IPRATROPIUM BROMIDE 0.02 % IN SOLN
0.5000 mg | Freq: Four times a day (QID) | RESPIRATORY_TRACT | Status: DC
  Administered 2016-05-13: 0.5 mg via RESPIRATORY_TRACT
  Filled 2016-05-13: qty 2.5

## 2016-05-13 MED ORDER — GUAIFENESIN-DM 100-10 MG/5ML PO SYRP
5.0000 mL | ORAL_SOLUTION | ORAL | Status: DC | PRN
  Administered 2016-05-13: 5 mL via ORAL
  Filled 2016-05-13: qty 10

## 2016-05-13 MED ORDER — LEVOTHYROXINE SODIUM 112 MCG PO TABS
112.0000 ug | ORAL_TABLET | Freq: Every day | ORAL | Status: DC
  Administered 2016-05-14: 112 ug via ORAL
  Filled 2016-05-13: qty 1

## 2016-05-13 MED ORDER — ALBUTEROL (5 MG/ML) CONTINUOUS INHALATION SOLN
10.0000 mg/h | INHALATION_SOLUTION | RESPIRATORY_TRACT | Status: DC
  Administered 2016-05-13: 10 mg/h via RESPIRATORY_TRACT

## 2016-05-13 MED ORDER — IPRATROPIUM-ALBUTEROL 0.5-2.5 (3) MG/3ML IN SOLN
3.0000 mL | Freq: Four times a day (QID) | RESPIRATORY_TRACT | Status: DC
  Administered 2016-05-14 (×2): 3 mL via RESPIRATORY_TRACT
  Filled 2016-05-13 (×2): qty 3

## 2016-05-13 MED ORDER — ALBUTEROL SULFATE (2.5 MG/3ML) 0.083% IN NEBU: 2.5000 mg | INHALATION_SOLUTION | RESPIRATORY_TRACT | Status: DC | PRN

## 2016-05-13 MED ORDER — SERTRALINE HCL 50 MG PO TABS: 100.0000 mg | ORAL_TABLET | Freq: Every day | ORAL | Status: DC

## 2016-05-13 MED ORDER — ONDANSETRON HCL 4 MG PO TABS: 4.0000 mg | ORAL_TABLET | Freq: Four times a day (QID) | ORAL | Status: DC | PRN

## 2016-05-13 MED ORDER — ENOXAPARIN SODIUM 40 MG/0.4ML ~~LOC~~ SOLN
40.0000 mg | SUBCUTANEOUS | Status: DC
  Administered 2016-05-13: 40 mg via SUBCUTANEOUS
  Filled 2016-05-13: qty 0.4

## 2016-05-13 MED ORDER — ALPRAZOLAM 0.25 MG PO TABS: 0.2500 mg | ORAL_TABLET | Freq: Every day | ORAL | Status: DC

## 2016-05-13 NOTE — ED Notes (Signed)
Bed: WA14 Expected date:  Expected time:  Means of arrival:  Comments: EMS-asthma 

## 2016-05-13 NOTE — ED Triage Notes (Signed)
Patient is alert and oriented x4.  Patient states that she has been having increasing shortness of breath over the last week.  Today her breathing has gotten worse and she has severe chest tightness. Currently she rates her tightness as 10 of 10

## 2016-05-13 NOTE — ED Provider Notes (Signed)
[facility] DEPT Provider Note   CSN: [id number] Arrival date & time: 05/13/16  1241     History   Chief Complaint Chief Complaint  Patient presents with  . Shortness of Breath    HPI Angela [name] is a 61 y.o. female.  Patient complains of shortness of breath. She has been using her albuterol every 2 hours.   The history is provided by the patient. No language interpreter was used.  Shortness of Breath  This is a new problem. The problem occurs continuously.The current episode started 12 to 24 hours ago. The problem has not changed since onset.Pertinent negatives include no headaches, no ear pain, no cough, no chest pain, no abdominal pain and no rash.    Past Medical History:  Diagnosis Date  . Anxiety   . Asthma   . Breast discharge   . Congestion of right ear   . Depression   . GERD (gastroesophageal reflux disease)   . Headache(784.0)   . Hyperlipidemia   . Hypothyroidism   . Seizures ([place])    hx of during childhood -not since age 79  . Sleep apnea    uses CPAP  . Thygeson's disease   . Thyroid disease   . Tinnitus   . Urticaria     Patient Active Problem List   Diagnosis Date Noted  . Erosion of suburethral sling ([place]) 11/01/2014  . Goiter, nodular 09/17/2012  . Unspecified hypothyroidism 09/04/2012  . Postop check 02/08/2011  . Nipple discharge 12/18/2010  . Right breast nipple inversion and discharge 11/06/2010    Past Surgical History:  Procedure Laterality Date  . ANTERIOR AND POSTERIOR REPAIR  2007  . BREAST DUCTAL SYSTEM EXCISION  01/18/2011   Procedure: EXCISION DUCTAL SYSTEM BREAST;  Surgeon: Belva Crome, MD;  Location: [facility];  Service: General;  Laterality: Right;  . BREAST SURGERY  01/18/2011   Right BX  . CYSTOSCOPY WITH URETHROLYSIS N/A 11/01/2014   Procedure: CYSTOSCOPY WITH URETHROLYSIS;  Surgeon: Bjorn Loser, MD;  Location: [facility];  Service: Urology;  Laterality: N/A;  . DILATION AND CURETTAGE OF  UTERUS    . PUBOVAGINAL SLING N/A 11/01/2014   Procedure: REMOVAL VAGINAL SLING AND URETHROLYSIS AND CYSTO;  Surgeon: Bjorn Loser, MD;  Location: [facility];  Service: Urology;  Laterality: N/A;  . TUBAL LIGATION  1998  . TYMPANOSTOMY TUBE PLACEMENT  2010   right ear   . WISDOM TOOTH EXTRACTION      OB History    Gravida Para Term Preterm AB Living   6 4 4   2 4    SAB TAB Ectopic Multiple Live Births   2               Home Medications    Prior to Admission medications   Medication Sig Start Date End Date Taking? Authorizing Provider  albuterol (PROVENTIL HFA) 108 (90 BASE) MCG/ACT inhaler Inhale 2 puffs into the lungs every 6 (six) hours as needed for wheezing. Patient not taking: Reported on 03/02/2015 10/08/12   Gregor Hams, MD  Albuterol Sulfate (PROAIR RESPICLICK) 297 (90 Base) MCG/ACT AEPB Inhale 2 puffs into the lungs every 4 (four) hours as needed. 03/02/15   Roselyn Malachy Moan, MD  Albuterol Sulfate (PROAIR RESPICLICK) 989 (90 Base) MCG/ACT AEPB Inhale 2 puffs into the lungs every 4 (four) hours as needed. 03/15/15   Roselyn Malachy Moan, MD  ALPRAZolam Duanne Moron) 0.25 MG tablet Take 0.25 mg by mouth daily. Reported on 03/02/2015  09/19/14   Historical Provider, MD  beclomethasone (QVAR) 80 MCG/ACT inhaler Inhale 2 puffs into the lungs 2 (two) times daily. To prevent cough or wheeze.  Rinse, gargle and spit with water after use. 01/15/16   Valentina Shaggy, MD  HYDROcodone-acetaminophen (NORCO/VICODIN) 5-325 MG tablet Take 1 tablet by mouth every 6 (six) hours as needed for moderate pain. Patient not taking: Reported on 03/02/2015 11/01/14   Christell Faith, MD  levothyroxine (SYNTHROID, LEVOTHROID) 112 MCG tablet Take 1 tablet (112 mcg total) by mouth daily. 01/26/14   Elayne Snare, MD  loratadine (CLARITIN) 10 MG tablet Take 10 mg by mouth daily as needed for allergies. Reported on 03/02/2015    Historical Provider, MD  loratadine-pseudoephedrine (CLARITIN-D 24-HOUR) 10-240 MG 24 hr tablet Take 1  tablet by mouth daily.    Historical Provider, MD  Multiple Vitamin (MULTIVITAMIN) tablet Take 1 tablet by mouth daily.    Historical Provider, MD  Olopatadine HCl 0.6 % SOLN 1-2 sprays in each nostril each evening as needed for congestion. 03/02/15   Roselyn Malachy Moan, MD  sertraline (ZOLOFT) 100 MG tablet Take 100 mg by mouth daily. Reported on 03/02/2015 09/14/14   Historical Provider, MD  triamcinolone (NASACORT AQ) 55 MCG/ACT AERO nasal inhaler Place 2 sprays into the nose as needed (allergies).    Historical Provider, MD    Family History Family History  Problem Relation Age of Onset  . Cancer Father     throat  . Cancer Maternal Aunt     breast  . Allergic rhinitis Neg Hx   . Asthma Neg Hx   . Eczema Neg Hx   . Immunodeficiency Neg Hx   . Urticaria Neg Hx     Social History Social History  Substance Use Topics  . Smoking status: Never Smoker  . Smokeless tobacco: Never Used  . Alcohol use No     Allergies   Aspirin; Ibuprofen; and Other   Review of Systems Review of Systems  Constitutional: Negative for appetite change and fatigue.  HENT: Negative for congestion, ear discharge, ear pain and sinus pressure.   Eyes: Negative for discharge.  Respiratory: Positive for shortness of breath. Negative for cough.   Cardiovascular: Negative for chest pain.  Gastrointestinal: Negative for abdominal pain and diarrhea.  Genitourinary: Negative for frequency and hematuria.  Musculoskeletal: Negative for back pain.  Skin: Negative for rash.  Neurological: Negative for seizures and headaches.  Psychiatric/Behavioral: Negative for hallucinations.     Physical Exam Updated Vital Signs BP (!) 173/117 (BP Location: Left Arm)   Pulse (!) 114   Temp 98.4 F (36.9 C) (Oral)   Resp (!) 28   Ht 5\' 5"  (1.651 m)   Wt 230 lb (104.3 kg)   SpO2 98%   BMI 38.27 kg/m   Physical Exam  Constitutional: She is oriented to person, place, and time. She appears well-developed.  HENT:    Head: Normocephalic.  Eyes: Conjunctivae and EOM are normal. No scleral icterus.  Neck: Neck supple. No thyromegaly present.  Cardiovascular: Normal rate and regular rhythm.  Exam reveals no gallop and no friction rub.   No murmur heard. Pulmonary/Chest: No stridor. She has wheezes. She has no rales. She exhibits no tenderness.  Abdominal: She exhibits no distension. There is no tenderness. There is no rebound.  Musculoskeletal: Normal range of motion. She exhibits no edema.  Lymphadenopathy:    She has no cervical adenopathy.  Neurological: She is oriented to person, place, and time. She  exhibits normal muscle tone. Coordination normal.  Skin: No rash noted. No erythema.  Psychiatric: She has a normal mood and affect. Her behavior is normal.     ED Treatments / Results  Labs (all labs ordered are listed, but only abnormal results are displayed) Labs Reviewed  CBC WITH DIFFERENTIAL/PLATELET - Abnormal; Notable for the following:       Result Value   RBC 5.86 (*)    MCV 72.4 (*)    MCH 24.1 (*)    RDW 15.9 (*)    All other components within normal limits  COMPREHENSIVE METABOLIC PANEL - Abnormal; Notable for the following:    Potassium 3.1 (*)    Glucose, Bld 119 (*)    Calcium 8.7 (*)    Total Protein 8.8 (*)    All other components within normal limits    EKG  EKG Interpretation None       Radiology Dg Chest 2 View  Result Date: 05/13/2016 CLINICAL DATA:  Productive cough EXAM: CHEST  2 VIEW COMPARISON:  10/08/2012 FINDINGS: Normal heart size and unremarkable mediastinal contours. No acute infiltrate or edema. No effusion or pneumothorax. No acute osseous findings. IMPRESSION: Negative chest. Electronically Signed   By: Monte Fantasia M.D.   On: 05/13/2016 13:19    Procedures Procedures (including critical care time)  Medications Ordered in ED Medications  albuterol (PROVENTIL,VENTOLIN) solution continuous neb (10 mg/hr Nebulization New Bag/Given 05/13/16 1315)   ipratropium (ATROVENT) nebulizer solution 0.5 mg (0.5 mg Nebulization Given 05/13/16 1315)     Initial Impression / Assessment and Plan / ED Course  I have reviewed the triage vital signs and the nursing notes.  Pertinent labs & imaging results that were available during my care of the patient were reviewed by me and considered in my medical decision making (see chart for details).     Patient with exacerbation of her asthma and hypertension. She will be admitted to medicine  Final Clinical Impressions(s) / ED Diagnoses   Final diagnoses:  SOB (shortness of breath)    New Prescriptions New Prescriptions   No medications on file     Milton Ferguson, MD 05/13/16 1531

## 2016-05-13 NOTE — ED Notes (Signed)
WILL TRANSPORT PT TO 1W 1103-1. AAOX4. PT IN NO APPARENT DISTRESS OR PAIN. THE OPPORTUNITY TO ASK QUESTIONS WAS PROVIDED.

## 2016-05-13 NOTE — Progress Notes (Signed)
Pt states that she will self administer CPAP when ready for bed.  RT to monitor and assess as needed.  

## 2016-05-13 NOTE — H&P (Signed)
[facility] H&P   Patient Demographics:    Angela [name], is a 61 y.o. female  MRN: [id number]   DOB - Mar 17, 1955  Admit Date - 05/13/2016  Outpatient Primary MD for the patient is Donnie Coffin, MD  Referring MD/NP/PA: Dr Roderic Palau  Patient coming from: Home  Chief Complaint  Patient presents with  . Shortness of Breath      HPI:    Angela [name]  is a 61 y.o. female, With past medical history of hypothyroidism, asthma, she presents with complaint of shortness of breath over the last 1 day, reports she has been using her inhalers more frequently, without improvement of her symptoms, as well she reports cough, productive yellow sputum, worsening dyspnea , and she received IV Solu-Medrol, continuous nebs by EMS, even with that she remained with significant wheezing in ED, chest x-ray with no evidence of pneumonia, she is afebrile, no hypoxia, but given significant wheezing I was called to admit, she denies any fever, chills, sick contacts, chest pain, hemoptysis, dysuria or polyuria.    Review of systems:    In addition to the HPI above,  No Fever-chills, No Headache, No changes with Vision or hearing, No problems swallowing food or Liquids, No Chest pain, Denies of cough, shortness of breath and productive sputum No Abdominal pain, No Nausea or Vommitting, Bowel movements are regular, No Blood in stool or Urine, No dysuria, No new skin rashes or bruises, No new joints pains-aches,  No new weakness, tingling, numbness in any extremity, No recent weight gain or loss, No polyuria, polydypsia or polyphagia, No significant Mental Stressors.  A full 10 point Review of Systems was done, except as stated above, all other Review of Systems were negative.   With Past History of the following :    Past Medical History:  Diagnosis Date  . Anxiety   . Asthma   . Breast discharge   .  Congestion of right ear   . Depression   . GERD (gastroesophageal reflux disease)   . Headache(784.0)   . Hyperlipidemia   . Hypothyroidism   . Seizures ([place])    hx of during childhood -not since age 42  . Sleep apnea    uses CPAP  . Thygeson's disease   . Thyroid disease   . Tinnitus   . Urticaria       Past Surgical History:  Procedure Laterality Date  . ANTERIOR AND POSTERIOR REPAIR  2007  . BREAST DUCTAL SYSTEM EXCISION  01/18/2011   Procedure: EXCISION DUCTAL SYSTEM BREAST;  Surgeon: Belva Crome, MD;  Location: [facility];  Service: General;  Laterality: Right;  . BREAST SURGERY  01/18/2011   Right BX  . CYSTOSCOPY WITH URETHROLYSIS N/A 11/01/2014   Procedure: CYSTOSCOPY WITH URETHROLYSIS;  Surgeon: Bjorn Loser, MD;  Location: [facility];  Service: Urology;  Laterality: N/A;  . DILATION AND CURETTAGE  OF UTERUS    . PUBOVAGINAL SLING N/A 11/01/2014   Procedure: REMOVAL VAGINAL SLING AND URETHROLYSIS AND CYSTO;  Surgeon: Bjorn Loser, MD;  Location: [facility];  Service: Urology;  Laterality: N/A;  . TUBAL LIGATION  1998  . TYMPANOSTOMY TUBE PLACEMENT  2010   right ear   . WISDOM TOOTH EXTRACTION        Social History:     Social History  Substance Use Topics  . Smoking status: Never Smoker  . Smokeless tobacco: Never Used  . Alcohol use No     Lives - at home   Mobility - independent     Family History :     Family History  Problem Relation Age of Onset  . Cancer Father     throat  . Cancer Maternal Aunt     breast  . Allergic rhinitis Neg Hx   . Asthma Neg Hx   . Eczema Neg Hx   . Immunodeficiency Neg Hx   . Urticaria Neg Hx      Home Medications:   Prior to Admission medications   Medication Sig Start Date End Date Taking? Authorizing Provider  albuterol (PROVENTIL HFA) 108 (90 BASE) MCG/ACT inhaler Inhale 2 puffs into the lungs every 6 (six) hours as needed for wheezing. Patient not taking: Reported on 03/02/2015 10/08/12    Gregor Hams, MD  Albuterol Sulfate (PROAIR RESPICLICK) 607 (90 Base) MCG/ACT AEPB Inhale 2 puffs into the lungs every 4 (four) hours as needed. 03/02/15   Roselyn Malachy Moan, MD  Albuterol Sulfate (PROAIR RESPICLICK) 371 (90 Base) MCG/ACT AEPB Inhale 2 puffs into the lungs every 4 (four) hours as needed. 03/15/15   Roselyn Malachy Moan, MD  ALPRAZolam Duanne Moron) 0.25 MG tablet Take 0.25 mg by mouth daily. Reported on 03/02/2015 09/19/14   Historical Provider, MD  beclomethasone (QVAR) 80 MCG/ACT inhaler Inhale 2 puffs into the lungs 2 (two) times daily. To prevent cough or wheeze.  Rinse, gargle and spit with water after use. 01/15/16   Valentina Shaggy, MD  HYDROcodone-acetaminophen (NORCO/VICODIN) 5-325 MG tablet Take 1 tablet by mouth every 6 (six) hours as needed for moderate pain. Patient not taking: Reported on 03/02/2015 11/01/14   Christell Faith, MD  levothyroxine (SYNTHROID, LEVOTHROID) 112 MCG tablet Take 1 tablet (112 mcg total) by mouth daily. 01/26/14   Elayne Snare, MD  loratadine (CLARITIN) 10 MG tablet Take 10 mg by mouth daily as needed for allergies. Reported on 03/02/2015    Historical Provider, MD  loratadine-pseudoephedrine (CLARITIN-D 24-HOUR) 10-240 MG 24 hr tablet Take 1 tablet by mouth daily.    Historical Provider, MD  Multiple Vitamin (MULTIVITAMIN) tablet Take 1 tablet by mouth daily.    Historical Provider, MD  Olopatadine HCl 0.6 % SOLN 1-2 sprays in each nostril each evening as needed for congestion. 03/02/15   Roselyn Malachy Moan, MD  sertraline (ZOLOFT) 100 MG tablet Take 100 mg by mouth daily. Reported on 03/02/2015 09/14/14   Historical Provider, MD  triamcinolone (NASACORT AQ) 55 MCG/ACT AERO nasal inhaler Place 2 sprays into the nose as needed (allergies).    Historical Provider, MD     Allergies:     Allergies  Allergen Reactions  . Aspirin Other (See Comments)    Stomach ulcers   . Ibuprofen Other (See Comments)    Cautious by MD due to kidney function   . Other Other (See  Comments)    NO BLOOD PRODUCTS - PT IN AGREEMENT FOR ALBUMIN  OR ALBUMIN CONTAINING PRODUCTS PER CONSENT     Physical Exam:   Vitals  Blood pressure (!) 173/117, pulse (!) 114, temperature 98.4 F (36.9 C), temperature source Oral, resp. rate (!) 28, height 5\' 5"  (1.651 m), weight 104.3 kg (230 lb), SpO2 98 %.   1. General Well-developed female lying in bed in NAD,    2. Normal affect and insight, Not Suicidal or Homicidal, Awake Alert, Oriented X 3.  3. No F.N deficits, ALL C.Nerves Intact, Strength 5/5 all 4 extremities, Sensation intact all 4 extremities, Plantars down going.  4. Ears and Eyes appear Normal, Conjunctivae clear, PERRLA. Moist Oral Mucosa.  5. Supple Neck, No JVD, No cervical lymphadenopathy appriciated, No Carotid Bruits.  6. Symmetrical Chest wall movement, Good air movement bilaterally, diffuse scattered wheezing, but no use of accessory muscles.  7. RRR, No Gallops, Rubs or Murmurs, No Parasternal Heave.  8. Positive Bowel Sounds, Abdomen Soft, No tenderness, No organomegaly appriciated,No rebound -guarding or rigidity.  9.  No Cyanosis, Normal Skin Turgor, No Skin Rash or Bruise.  10. Good muscle tone,  joints appear normal , no effusions, Normal ROM.  11. No Palpable Lymph Nodes in Neck or Axillae    Data Review:    CBC  Recent Labs Lab 05/13/16 1305  WBC 8.7  HGB 14.1  HCT 42.4  PLT 229  MCV 72.4*  MCH 24.1*  MCHC 33.3  RDW 15.9*  LYMPHSABS 1.2  MONOABS 0.2  EOSABS 0.3  BASOSABS 0.0   ------------------------------------------------------------------------------------------------------------------  Chemistries   Recent Labs Lab 05/13/16 1305  NA 142  K 3.1*  CL 106  CO2 26  GLUCOSE 119*  BUN 7  CREATININE 0.88  CALCIUM 8.7*  AST 23  ALT 23  ALKPHOS 69  BILITOT 0.6   ------------------------------------------------------------------------------------------------------------------ estimated creatinine clearance is 83.5  mL/min (by C-G formula based on SCr of 0.88 mg/dL). ------------------------------------------------------------------------------------------------------------------ No results for input(s): TSH, T4TOTAL, T3FREE, THYROIDAB in the last 72 hours.  Invalid input(s): FREET3  Coagulation profile No results for input(s): INR, PROTIME in the last 168 hours. ------------------------------------------------------------------------------------------------------------------- No results for input(s): DDIMER in the last 72 hours. -------------------------------------------------------------------------------------------------------------------  Cardiac Enzymes No results for input(s): CKMB, TROPONINI, MYOGLOBIN in the last 168 hours.  Invalid input(s): CK ------------------------------------------------------------------------------------------------------------------ No results found for: BNP   ---------------------------------------------------------------------------------------------------------------  Urinalysis    Component Value Date/Time   COLORURINE YELLOW 07/20/2007 [place] 07/20/2007 0859   LABSPEC 1.020 07/20/2007 0859   PHURINE 5.0 07/20/2007 0859   GLUCOSEU NEGATIVE 07/20/2007 0859   HGBUR NEGATIVE 07/20/2007 0859   BILIRUBINUR NEGATIVE 07/20/2007 0859   KETONESUR NEGATIVE 07/20/2007 0859   PROTEINUR NEGATIVE 07/20/2007 0859   UROBILINOGEN 0.2 07/20/2007 0859   NITRITE NEGATIVE 07/20/2007 0859   LEUKOCYTESUR  07/20/2007 0859    NEGATIVE MICROSCOPIC NOT DONE ON URINES WITH NEGATIVE PROTEIN, BLOOD, LEUKOCYTES, NITRITE, OR GLUCOSE <1000 mg/dL.    ----------------------------------------------------------------------------------------------------------------   Imaging Results:    Dg Chest 2 View  Result Date: 05/13/2016 CLINICAL DATA:  Productive cough EXAM: CHEST  2 VIEW COMPARISON:  10/08/2012 FINDINGS: Normal heart size and unremarkable mediastinal  contours. No acute infiltrate or edema. No effusion or pneumothorax. No acute osseous findings. IMPRESSION: Negative chest. Electronically Signed   By: Monte Fantasia M.D.   On: 05/13/2016 13:19     Assessment & Plan:    Active Problems:   Hypothyroidism   Asthma exacerbation   Anxiety   Depression   Asthma exacerbation - A significant wheezing, required  multiple nebs, and IV Solu-Medrol, she appears to be improving, but hematocrit significant wheezing, she will be admitted for observation, will start on IV Solu-Medrol scheduled, continue with Qvar, scheduled nebs and when necessary albuterol, and continue with continuous pulse ox, will give one dose of magnesium, and given productive cough with yellow phlegm, will start on levofloxacin.  Hypothyroidism - Continue Synthroid  Anxiety and depression - Continue with home medication  DVT Prophylaxis   Lovenox - SCDs  AM Labs Ordered, also please review Full Orders  Family Communication: Admission, patients condition and plan of care including tests being ordered have been discussed with the patient   who indicate understanding and agree with the plan and Code Status.  Code Status Full  Likely DC to  Home  Condition GUARDED   Consults called: None  Admission status: Observation  Time spent in minutes : 55 minutes   Annaka Cleaver M.D on 05/13/2016 at 3:38 PM  Between 7am to 7pm - Pager - [phone]. After 7pm go to [email or web address] - password [facility]  [facility] - Office  [phone]

## 2016-05-14 DIAGNOSIS — E039 Hypothyroidism, unspecified: Secondary | ICD-10-CM | POA: Diagnosis not present

## 2016-05-14 DIAGNOSIS — F419 Anxiety disorder, unspecified: Secondary | ICD-10-CM | POA: Diagnosis not present

## 2016-05-14 DIAGNOSIS — F329 Major depressive disorder, single episode, unspecified: Secondary | ICD-10-CM | POA: Diagnosis not present

## 2016-05-14 DIAGNOSIS — J45901 Unspecified asthma with (acute) exacerbation: Secondary | ICD-10-CM | POA: Diagnosis not present

## 2016-05-14 LAB — HIV ANTIBODY (ROUTINE TESTING W REFLEX): HIV Screen 4th Generation wRfx: NONREACTIVE

## 2016-05-14 MED ORDER — PREDNISONE 10 MG PO TABS: ORAL_TABLET | ORAL | 0 refills | Status: DC

## 2016-05-14 MED ORDER — LEVOFLOXACIN 500 MG PO TABS: 500.0000 mg | ORAL_TABLET | Freq: Every day | ORAL | 0 refills | Status: DC

## 2016-05-14 NOTE — Discharge Summary (Signed)
Angela [name], is a 61 y.o. female  DOB Jul 05, 1955  MRN [id number].  Admission date:  05/13/2016  Admitting Physician  Angela Patricia, MD  Discharge Date:  05/14/2016   Primary MD  Angela Coffin, MD  Recommendations for primary care physician for things to follow:  - please check CBC, BMP during next visit   Admission Diagnosis  SOB (shortness of breath) [R06.02]   Discharge Diagnosis  SOB (shortness of breath) [R06.02]    Active Problems:   Hypothyroidism   Asthma exacerbation   Anxiety   Depression      Past Medical History:  Diagnosis Date  . Anxiety   . Asthma   . Breast discharge   . Congestion of right ear   . Depression   . GERD (gastroesophageal reflux disease)   . Headache(784.0)   . Hyperlipidemia   . Hypothyroidism   . Seizures ([place])    hx of during childhood -not since age 56  . Sleep apnea    uses CPAP  . Thygeson's disease   . Thyroid disease   . Tinnitus   . Urticaria     Past Surgical History:  Procedure Laterality Date  . ANTERIOR AND POSTERIOR REPAIR  2007  . BREAST DUCTAL SYSTEM EXCISION  01/18/2011   Procedure: EXCISION DUCTAL SYSTEM BREAST;  Surgeon: Belva Crome, MD;  Location: [facility];  Service: General;  Laterality: Right;  . BREAST SURGERY  01/18/2011   Right BX  . CYSTOSCOPY WITH URETHROLYSIS N/A 11/01/2014   Procedure: CYSTOSCOPY WITH URETHROLYSIS;  Surgeon: Bjorn Loser, MD;  Location: [facility];  Service: Urology;  Laterality: N/A;  . DILATION AND CURETTAGE OF UTERUS    . PUBOVAGINAL SLING N/A 11/01/2014   Procedure: REMOVAL VAGINAL SLING AND URETHROLYSIS AND CYSTO;  Surgeon: Bjorn Loser, MD;  Location: [facility];  Service: Urology;  Laterality: N/A;  . TUBAL LIGATION  1998  . TYMPANOSTOMY TUBE PLACEMENT  2010   right ear   . WISDOM TOOTH EXTRACTION         History of present illness and  Hospital Course:      Kindly see H&P for history of present illness and admission details, please review complete Labs, Consult reports and Test reports for all details in brief  HPI  from the history and physical done on the day of admission 05/13/2016 Angela [name]  is a 61 y.o. female, With past medical history of hypothyroidism, asthma, she presents with complaint of shortness of breath over the last 1 day, reports she has been using her inhalers more frequently, without improvement of her symptoms, as well she reports cough, productive yellow sputum, worsening dyspnea , and she received IV Solu-Medrol, continuous nebs by EMS, even with that she remained with significant wheezing in ED, chest x-ray with no evidence of pneumonia, she is afebrile, no hypoxia, but given significant wheezing I was called to admit, she denies any fever, chills, sick contacts, chest pain, hemoptysis, dysuria or polyuria.   Hospital Course  Asthma exacerbation - presents with significant wheezing, required multiple nebs, and IV Solu-Medrol, NAD, with significant improvement of symptoms, but she remains a significant wheezing, so she was admitted for observation, kept onsolumedrol  scheduled nebulizer treatment, and was started on levofloxacin given productive yellow sputum, his morning she feels much better, ambulated in the hallway with no wheezing or hypoxia, she will be discharged on prednisone taper, levofloxacin, and resume her home medication including albuterol and Qvar.  Hypothyroidism - Continue Synthroid  Anxiety and depression - Continue with home medication      Discharge Condition:  stable   Follow UP  Follow-up Information    Angela Coffin, MD Follow up in 1 week(s).   Specialty:  Family Medicine Contact information: [address] [place] [address] [phone]             Discharge Instructions  and  Discharge Medications    Discharge Instructions    Discharge  instructions    Complete by:  As directed    Follow with Primary MD Angela Coffin, MD in 7 days   Get CBC, CMP,  checked  by Primary MD next visit.    Activity: As tolerated with Full fall precautions use walker/cane & assistance as needed  Disposition Home    Diet: Regular diet   On your next visit with your primary care physician please Get Medicines reviewed and adjusted.   Please request your Prim.MD to go over all Hospital Tests and Procedure/Radiological results at the follow up, please get all Hospital records sent to your Prim MD by signing hospital release before you go home.   If you experience worsening of your admission symptoms, develop shortness of breath, life threatening emergency, suicidal or homicidal thoughts you must seek medical attention immediately by calling 911 or calling your MD immediately  if symptoms less severe.  You Must read complete instructions/literature along with all the possible adverse reactions/side effects for all the Medicines you take and that have been prescribed to you. Take any new Medicines after you have completely understood and accpet all the possible adverse reactions/side effects.   Do not drive, operating heavy machinery, perform activities at heights, swimming or participation in water activities or provide baby sitting services if your were admitted for syncope or siezures until you have seen by Primary MD or a Neurologist and advised to do so again.  Do not drive when taking Pain medications.    Do not take more than prescribed Pain, Sleep and Anxiety Medications  Special Instructions: If you have smoked or chewed Tobacco  in the last 2 yrs please stop smoking, stop any regular Alcohol  and or any Recreational drug use.  Wear Seat belts while driving.   Please note  You were cared for by a hospitalist during your hospital stay. If you have any questions about your discharge medications or the care you received while you  were in the hospital after you are discharged, you can call the unit and asked to speak with the hospitalist on call if the hospitalist that took care of you is not available. Once you are discharged, your primary care physician will handle any further medical issues. Please note that NO REFILLS for any discharge medications will be authorized once you are discharged, as it is imperative that you return to your primary care physician (or establish a relationship with a primary care physician if you do not have one) for your aftercare needs so that they can  reassess your need for medications and monitor your lab values.   Increase activity slowly    Complete by:  As directed      Allergies as of 05/14/2016      Reactions   Aspirin Other (See Comments)   Stomach ulcers    Ibuprofen Other (See Comments)   Cautious by MD due to kidney function    Other Other (See Comments)   NO BLOOD PRODUCTS - PT IN AGREEMENT FOR ALBUMIN OR ALBUMIN CONTAINING PRODUCTS PER CONSENT      Medication List    STOP taking these medications   Olopatadine HCl 0.6 % Soln     TAKE these medications   Albuterol Sulfate 108 (90 Base) MCG/ACT Aepb Commonly known as:  PROAIR RESPICLICK Inhale 2 puffs into the lungs every 4 (four) hours as needed. What changed:  Another medication with the same name was removed. Continue taking this medication, and follow the directions you see here.   beclomethasone 80 MCG/ACT inhaler Commonly known as:  QVAR Inhale 2 puffs into the lungs 2 (two) times daily. To prevent cough or wheeze.  Rinse, gargle and spit with water after use.   levofloxacin 500 MG tablet Commonly known as:  LEVAQUIN Take 1 tablet (500 mg total) by mouth daily.   levothyroxine 112 MCG tablet Commonly known as:  SYNTHROID, LEVOTHROID Take 1 tablet (112 mcg total) by mouth daily.   loratadine 10 MG tablet Commonly known as:  CLARITIN Take 10 mg by mouth daily as needed for allergies. Reported on 03/02/2015    loratadine-pseudoephedrine 10-240 MG 24 hr tablet Commonly known as:  CLARITIN-D 24-hour Take 1 tablet by mouth daily as needed for allergies.   multivitamin tablet Take 1 tablet by mouth daily.   NASACORT AQ 55 MCG/ACT Aero nasal inhaler Generic drug:  triamcinolone Place 2 sprays into the nose as needed (allergies).   predniSONE 10 MG tablet Commonly known as:  DELTASONE Please take 40 mg oral daily for 3 days, then 30 mg oral daily for 3 days, then 20 mg oral daily for 3 days, then 10 mg oral daily for 3 days, then stop         Diet and Activity recommendation: See Discharge Instructions above   Consults obtained -  none   Major procedures and Radiology Reports - PLEASE review detailed and final reports for all details, in brief -      Dg Chest 2 View  Result Date: 05/13/2016 CLINICAL DATA:  Productive cough EXAM: CHEST  2 VIEW COMPARISON:  10/08/2012 FINDINGS: Normal heart size and unremarkable mediastinal contours. No acute infiltrate or edema. No effusion or pneumothorax. No acute osseous findings. IMPRESSION: Negative chest. Electronically Signed   By: Monte Fantasia M.D.   On: 05/13/2016 13:19    Micro Results     No results found for this or any previous visit (from the past 240 hour(s)).     Today   Subjective:   Jaycelyn Orrison today has no headache,no chest or abdominal pain, and eyes any cough, reports dyspnea significantly subsided, ambulating in the hallway with no dyspnea, feels much better wants to go home today.   Objective:   Blood pressure (!) 157/104, pulse 87, temperature 98 F (36.7 C), temperature source Oral, resp. rate 20, height 5\' 5"  (1.651 m), weight 104.3 kg (230 lb), SpO2 94 %.   Intake/Output Summary (Last 24 hours) at 05/14/16 1153 Last data filed at 05/14/16 0824  Gross per 24 hour  Intake  270 ml  Output                0 ml  Net              270 ml    Exam Awake Alert, Oriented x 3, No new F.N deficits,  Normal affect [place].AT,PERRAL Supple Neck,No JVD, No cervical lymphadenopathy appriciated.  Symmetrical Chest wall movement, Good air movement bilaterally, CTAB RRR,No Gallops,Rubs or new Murmurs, No Parasternal Heave +ve B.Sounds, Abd Soft, Non tender, No organomegaly appriciated, No rebound -guarding or rigidity. No Cyanosis, Clubbing or edema, No new Rash or bruise  Data Review   CBC w Diff: Lab Results  Component Value Date   WBC 10.6 (H) 05/13/2016   HGB 13.8 05/13/2016   HCT 41.6 05/13/2016   PLT 277 05/13/2016   LYMPHOPCT 13 05/13/2016   MONOPCT 2 05/13/2016   EOSPCT 4 05/13/2016   BASOPCT 0 05/13/2016    CMP: Lab Results  Component Value Date   NA 142 05/13/2016   K 3.1 (L) 05/13/2016   CL 106 05/13/2016   CO2 26 05/13/2016   BUN 7 05/13/2016   CREATININE 0.91 05/13/2016   PROT 8.8 (H) 05/13/2016   ALBUMIN 4.4 05/13/2016   BILITOT 0.6 05/13/2016   ALKPHOS 69 05/13/2016   AST 23 05/13/2016   ALT 23 05/13/2016  .   Total Time in preparing paper work, data evaluation and todays exam - 35 minutes  Brielynn Sekula M.D on 05/14/2016 at 11:53 AM  [facility]   Office  [phone]

## 2016-05-14 NOTE — Discharge Instructions (Signed)
Follow with Primary MD Donnie Coffin, MD in 7 days   Get CBC, CMP,  checked  by Primary MD next visit.    Activity: As tolerated with Full fall precautions use walker/cane & assistance as needed  Disposition Home    Diet: Regular diet   On your next visit with your primary care physician please Get Medicines reviewed and adjusted.   Please request your Prim.MD to go over all Hospital Tests and Procedure/Radiological results at the follow up, please get all Hospital records sent to your Prim MD by signing hospital release before you go home.   If you experience worsening of your admission symptoms, develop shortness of breath, life threatening emergency, suicidal or homicidal thoughts you must seek medical attention immediately by calling 911 or calling your MD immediately  if symptoms less severe.  You Must read complete instructions/literature along with all the possible adverse reactions/side effects for all the Medicines you take and that have been prescribed to you. Take any new Medicines after you have completely understood and accpet all the possible adverse reactions/side effects.   Do not drive, operating heavy machinery, perform activities at heights, swimming or participation in water activities or provide baby sitting services if your were admitted for syncope or siezures until you have seen by Primary MD or a Neurologist and advised to do so again.  Do not drive when taking Pain medications.    Do not take more than prescribed Pain, Sleep and Anxiety Medications  Special Instructions: If you have smoked or chewed Tobacco  in the last 2 yrs please stop smoking, stop any regular Alcohol  and or any Recreational drug use.  Wear Seat belts while driving.   Please note  You were cared for by a hospitalist during your hospital stay. If you have any questions about your discharge medications or the care you received while you were in the hospital after you are discharged,  you can call the unit and asked to speak with the hospitalist on call if the hospitalist that took care of you is not available. Once you are discharged, your primary care physician will handle any further medical issues. Please note that NO REFILLS for any discharge medications will be authorized once you are discharged, as it is imperative that you return to your primary care physician (or establish a relationship with a primary care physician if you do not have one) for your aftercare needs so that they can reassess your need for medications and monitor your lab values.

## 2016-05-14 NOTE — Progress Notes (Signed)
Discharged to home via w/c, via family car. RX for Levaquin and Prednisone given to patient. Patient state " that she understands discharge instructions.

## 2016-06-22 ENCOUNTER — Inpatient Hospital Stay (HOSPITAL_COMMUNITY)
Admission: EM | Admit: 2016-06-22 | Discharge: 2016-06-23 | DRG: 203 | Disposition: A | Discharge status: Home / Self Care | Payer: Self-pay | Attending: Internal Medicine | Admitting: Internal Medicine

## 2016-06-22 ENCOUNTER — Emergency Department (HOSPITAL_COMMUNITY): Payer: Self-pay

## 2016-06-22 ENCOUNTER — Encounter (HOSPITAL_COMMUNITY): Payer: Self-pay

## 2016-06-22 DIAGNOSIS — J45901 Unspecified asthma with (acute) exacerbation: Secondary | ICD-10-CM | POA: Diagnosis present

## 2016-06-22 DIAGNOSIS — T380X5A Adverse effect of glucocorticoids and synthetic analogues, initial encounter: Secondary | ICD-10-CM | POA: Diagnosis present

## 2016-06-22 DIAGNOSIS — D72829 Elevated white blood cell count, unspecified: Secondary | ICD-10-CM | POA: Diagnosis present

## 2016-06-22 DIAGNOSIS — J4521 Mild intermittent asthma with (acute) exacerbation: Principal | ICD-10-CM | POA: Diagnosis present

## 2016-06-22 DIAGNOSIS — F419 Anxiety disorder, unspecified: Secondary | ICD-10-CM | POA: Diagnosis present

## 2016-06-22 DIAGNOSIS — E039 Hypothyroidism, unspecified: Secondary | ICD-10-CM | POA: Diagnosis present

## 2016-06-22 DIAGNOSIS — E785 Hyperlipidemia, unspecified: Secondary | ICD-10-CM | POA: Diagnosis present

## 2016-06-22 LAB — CBC
HCT: 41.2 % (ref 36.0–46.0)
Hemoglobin: 14.2 g/dL (ref 12.0–15.0)
MCH: 24.9 pg — ABNORMAL LOW (ref 26.0–34.0)
MCHC: 34.5 g/dL (ref 30.0–36.0)
MCV: 72.3 fL — ABNORMAL LOW (ref 78.0–100.0)
Platelets: 317 10*3/uL (ref 150–400)
RBC: 5.7 MIL/uL — ABNORMAL HIGH (ref 3.87–5.11)
RDW: 16.3 % — ABNORMAL HIGH (ref 11.5–15.5)
WBC: 9.2 10*3/uL (ref 4.0–10.5)

## 2016-06-22 LAB — BASIC METABOLIC PANEL
Anion gap: 12 (ref 5–15)
BUN: 9 mg/dL (ref 6–20)
CO2: 20 mmol/L — ABNORMAL LOW (ref 22–32)
Calcium: 8.4 mg/dL — ABNORMAL LOW (ref 8.9–10.3)
Chloride: 109 mmol/L (ref 101–111)
Creatinine, Ser: 0.9 mg/dL (ref 0.44–1.00)
GFR calc Af Amer: >60 mL/min (ref >60)
GFR calc non Af Amer: >60 mL/min (ref >60)
Glucose, Bld: 154 mg/dL — ABNORMAL HIGH (ref 65–99)
Potassium: 3.2 mmol/L — ABNORMAL LOW (ref 3.5–5.1)
Sodium: 141 mmol/L (ref 135–145)

## 2016-06-22 MED ORDER — ALBUTEROL SULFATE (2.5 MG/3ML) 0.083% IN NEBU
5.0000 mg | INHALATION_SOLUTION | Freq: Once | RESPIRATORY_TRACT | Status: AC
  Administered 2016-06-22: 5 mg via RESPIRATORY_TRACT
  Filled 2016-06-22: qty 6

## 2016-06-22 MED ORDER — MAGNESIUM SULFATE 2 GM/50ML IV SOLN
2.0000 g | Freq: Once | INTRAVENOUS | Status: AC
  Administered 2016-06-22: 2 g via INTRAVENOUS
  Filled 2016-06-22: qty 50

## 2016-06-22 MED ORDER — TRAMADOL HCL 50 MG PO TABS: 50.0000 mg | ORAL_TABLET | Freq: Four times a day (QID) | ORAL | Status: DC | PRN

## 2016-06-22 MED ORDER — SODIUM CHLORIDE 0.9 % IV BOLUS (SEPSIS)
1000.0000 mL | Freq: Once | INTRAVENOUS | Status: AC
  Administered 2016-06-22: 1000 mL via INTRAVENOUS

## 2016-06-22 MED ORDER — ACETAMINOPHEN 650 MG RE SUPP: 650.0000 mg | Freq: Four times a day (QID) | RECTAL | Status: DC | PRN

## 2016-06-22 MED ORDER — FLUTICASONE PROPIONATE 50 MCG/ACT NA SUSP
2.0000 | Freq: Every day | NASAL | Status: DC
  Administered 2016-06-22 – 2016-06-23 (×2): 2 via NASAL
  Filled 2016-06-22: qty 16

## 2016-06-22 MED ORDER — ALBUTEROL (5 MG/ML) CONTINUOUS INHALATION SOLN
10.0000 mg/h | INHALATION_SOLUTION | Freq: Once | RESPIRATORY_TRACT | Status: AC
  Administered 2016-06-22: 10 mg/h via RESPIRATORY_TRACT

## 2016-06-22 MED ORDER — PHENOL 1.4 % MT LIQD
1.0000 | OROMUCOSAL | Status: DC | PRN
  Administered 2016-06-22: 1 via OROMUCOSAL
  Filled 2016-06-22: qty 177

## 2016-06-22 MED ORDER — IPRATROPIUM-ALBUTEROL 0.5-2.5 (3) MG/3ML IN SOLN
3.0000 mL | Freq: Four times a day (QID) | RESPIRATORY_TRACT | Status: DC
  Administered 2016-06-22 – 2016-06-23 (×3): 3 mL via RESPIRATORY_TRACT
  Filled 2016-06-22 (×3): qty 3

## 2016-06-22 MED ORDER — SODIUM CHLORIDE 0.9% FLUSH: 3.0000 mL | INTRAVENOUS | Status: DC | PRN

## 2016-06-22 MED ORDER — IPRATROPIUM BROMIDE 0.02 % IN SOLN
0.5000 mg | Freq: Once | RESPIRATORY_TRACT | Status: AC
  Administered 2016-06-22: 0.5 mg via RESPIRATORY_TRACT
  Filled 2016-06-22: qty 2.5

## 2016-06-22 MED ORDER — POTASSIUM CHLORIDE CRYS ER 20 MEQ PO TBCR
40.0000 meq | EXTENDED_RELEASE_TABLET | Freq: Once | ORAL | Status: AC
  Administered 2016-06-22: 40 meq via ORAL
  Filled 2016-06-22: qty 2

## 2016-06-22 MED ORDER — ALBUTEROL SULFATE (2.5 MG/3ML) 0.083% IN NEBU: 5.0000 mg | INHALATION_SOLUTION | Freq: Once | RESPIRATORY_TRACT | Status: DC

## 2016-06-22 MED ORDER — IPRATROPIUM BROMIDE 0.02 % IN SOLN
0.5000 mg | Freq: Once | RESPIRATORY_TRACT | Status: AC
Start: 2016-06-22 — End: 2016-06-22
  Administered 2016-06-22: 0.5 mg via RESPIRATORY_TRACT
  Filled 2016-06-22: qty 2.5

## 2016-06-22 MED ORDER — ALBUTEROL SULFATE (2.5 MG/3ML) 0.083% IN NEBU: 2.5000 mg | INHALATION_SOLUTION | RESPIRATORY_TRACT | Status: DC | PRN

## 2016-06-22 MED ORDER — ALPRAZOLAM 0.25 MG PO TABS: 0.2500 mg | ORAL_TABLET | Freq: Two times a day (BID) | ORAL | Status: DC | PRN

## 2016-06-22 MED ORDER — LIP MEDEX EX OINT
TOPICAL_OINTMENT | CUTANEOUS | Status: DC | PRN
  Administered 2016-06-22: 22:00:00 via TOPICAL
  Filled 2016-06-22: qty 7

## 2016-06-22 MED ORDER — METHYLPREDNISOLONE SODIUM SUCC 125 MG IJ SOLR
60.0000 mg | Freq: Three times a day (TID) | INTRAMUSCULAR | Status: DC
  Administered 2016-06-22 – 2016-06-23 (×3): 60 mg via INTRAVENOUS
  Filled 2016-06-22 (×3): qty 2

## 2016-06-22 MED ORDER — PREDNISONE 20 MG PO TABS
60.0000 mg | ORAL_TABLET | Freq: Once | ORAL | Status: AC
  Administered 2016-06-22: 60 mg via ORAL
  Filled 2016-06-22: qty 3

## 2016-06-22 MED ORDER — SODIUM CHLORIDE 0.9 % IV SOLN: 250.0000 mL | INTRAVENOUS | Status: DC | PRN

## 2016-06-22 MED ORDER — BUDESONIDE 0.25 MG/2ML IN SUSP
0.2500 mg | Freq: Two times a day (BID) | RESPIRATORY_TRACT | Status: DC
  Administered 2016-06-22 – 2016-06-23 (×2): 0.25 mg via RESPIRATORY_TRACT
  Filled 2016-06-22 (×2): qty 2

## 2016-06-22 MED ORDER — ALBUTEROL (5 MG/ML) CONTINUOUS INHALATION SOLN
10.0000 mg/h | INHALATION_SOLUTION | Freq: Once | RESPIRATORY_TRACT | Status: AC
  Administered 2016-06-22: 10 mg/h via RESPIRATORY_TRACT
  Filled 2016-06-22: qty 20

## 2016-06-22 MED ORDER — ENOXAPARIN SODIUM 40 MG/0.4ML ~~LOC~~ SOLN
40.0000 mg | SUBCUTANEOUS | Status: DC
  Administered 2016-06-22: 40 mg via SUBCUTANEOUS
  Filled 2016-06-22: qty 0.4

## 2016-06-22 MED ORDER — ONDANSETRON HCL 4 MG PO TABS: 4.0000 mg | ORAL_TABLET | Freq: Four times a day (QID) | ORAL | Status: DC | PRN

## 2016-06-22 MED ORDER — SODIUM CHLORIDE 0.9% FLUSH
3.0000 mL | Freq: Two times a day (BID) | INTRAVENOUS | Status: DC
  Administered 2016-06-22 (×2): 3 mL via INTRAVENOUS

## 2016-06-22 MED ORDER — GUAIFENESIN ER 600 MG PO TB12
600.0000 mg | ORAL_TABLET | Freq: Two times a day (BID) | ORAL | Status: DC
  Administered 2016-06-22 – 2016-06-23 (×2): 600 mg via ORAL
  Filled 2016-06-22 (×2): qty 1

## 2016-06-22 MED ORDER — ACETAMINOPHEN 325 MG PO TABS
650.0000 mg | ORAL_TABLET | Freq: Four times a day (QID) | ORAL | Status: DC | PRN
  Administered 2016-06-22 – 2016-06-23 (×2): 650 mg via ORAL
  Filled 2016-06-22 (×2): qty 2

## 2016-06-22 MED ORDER — LIP MEDEX EX OINT: 1.0000 "application " | TOPICAL_OINTMENT | CUTANEOUS | Status: DC | PRN

## 2016-06-22 MED ORDER — ONDANSETRON HCL 4 MG/2ML IJ SOLN: 4.0000 mg | Freq: Four times a day (QID) | INTRAMUSCULAR | Status: DC | PRN

## 2016-06-22 MED ORDER — OXYMETAZOLINE HCL 0.05 % NA SOLN: 1.0000 | Freq: Two times a day (BID) | NASAL | Status: DC | PRN

## 2016-06-22 MED ORDER — LORATADINE 10 MG PO TABS
10.0000 mg | ORAL_TABLET | Freq: Every day | ORAL | Status: DC
  Administered 2016-06-22 – 2016-06-23 (×2): 10 mg via ORAL
  Filled 2016-06-22: qty 1

## 2016-06-22 MED ORDER — LEVOTHYROXINE SODIUM 112 MCG PO TABS
112.0000 ug | ORAL_TABLET | Freq: Every day | ORAL | Status: DC
  Administered 2016-06-23: 112 ug via ORAL
  Filled 2016-06-22: qty 1

## 2016-06-22 NOTE — ED Provider Notes (Signed)
Assumed care from Dr. Tomi Bamberger at 7 AM. Briefly, the patient is a 61 y.o. female with PMHx of  has a past medical history of Anxiety; Asthma; Breast discharge; Congestion of right ear; Depression; GERD (gastroesophageal reflux disease); Headache(784.0); Hyperlipidemia; Hypothyroidism; Seizures ([place]); Sleep apnea; Thygeson's disease; Thyroid disease; Tinnitus; and Urticaria. here with asthma exacerbation. Pt markedly wheezy on arrival, and is now s/p nebs x 3 hours, mag, solumedrol. Awaiting response.   Labs Reviewed  CBC  BASIC METABOLIC PANEL    Course of Care: -On reassessment, patient persistently wheezy and tachypnea. Heart rate in the low 110s, though some of this is likely secondary to her albuterol. She continues to have significant dyspnea. Given her persistent work of breathing despite multiple doses of bronchodilators, will admit for further management.  Clinical Impression: 1. Exacerbation of intermittent asthma, unspecified asthma severity     Disposition: Admit to Medicine      Duffy Bruce, MD 06/22/16 [phone]

## 2016-06-22 NOTE — ED Triage Notes (Signed)
Pt reports SOB w/ exertion and chest tightness. Pt reports using inhaler w/o relief. Pt in no acute distress.

## 2016-06-22 NOTE — ED Notes (Signed)
Attempted to get EKG again, but respiratory was in there.

## 2016-06-22 NOTE — ED Notes (Signed)
Bed: WA13 Expected date:  Expected time:  Means of arrival:  Comments: TR1 

## 2016-06-22 NOTE — ED Notes (Signed)
Attempted to get EKG, but a breathing treatment was just started.

## 2016-06-22 NOTE — ED Provider Notes (Signed)
[facility] DEPT Provider Note   CSN: [id number] Arrival date & time: 06/22/16  0354   Time seen 04:16 AM  History   Chief Complaint Chief Complaint  Patient presents with  . Shortness of Breath    HPI Angela [name] is a 61 y.o. female.  HPI  patient states she has a history of reactive airway disease, and states she had an episode last month when her grandson came home from school with a cold. She states he came home from school again with a cold on May 21 and she had URI symptoms for a day, however since then she has started having a flareup of her asthma. She states she has a cough with clear mucus production, her chest feels tight and she is wheezing, she has rhinorrhea that is clear without sneezing, and she denies sore throat or fever. She states she was using her nebulizer or inhaler every 4 hours and now she has to use it every 3 hours because of wheezing and shortness of breath. She states she's never had to be admitted to the hospital because of her asthma, and she has been on steroids in the past when she has flareups.  PCP Alroy Dust, L.[name], MD Allergist Dr Ishmael Holter  Past Medical History:  Diagnosis Date  . Anxiety   . Asthma   . Breast discharge   . Congestion of right ear   . Depression   . GERD (gastroesophageal reflux disease)   . Headache(784.0)   . Hyperlipidemia   . Hypothyroidism   . Seizures ([place])    hx of during childhood -not since age 38  . Sleep apnea    uses CPAP  . Thygeson's disease   . Thyroid disease   . Tinnitus   . Urticaria     Patient Active Problem List   Diagnosis Date Noted  . Asthma exacerbation 05/13/2016  . Anxiety 05/13/2016  . Depression 05/13/2016  . Erosion of suburethral sling ([place]) 11/01/2014  . Goiter, nodular 09/17/2012  . Hypothyroidism 09/04/2012  . Postop check 02/08/2011  . Nipple discharge 12/18/2010  . Right breast nipple inversion and discharge 11/06/2010    Past Surgical History:  Procedure Laterality  Date  . ANTERIOR AND POSTERIOR REPAIR  2007  . BREAST DUCTAL SYSTEM EXCISION  01/18/2011   Procedure: EXCISION DUCTAL SYSTEM BREAST;  Surgeon: Belva Crome, MD;  Location: [facility];  Service: General;  Laterality: Right;  . BREAST SURGERY  01/18/2011   Right BX  . CYSTOSCOPY WITH URETHROLYSIS N/A 11/01/2014   Procedure: CYSTOSCOPY WITH URETHROLYSIS;  Surgeon: Bjorn Loser, MD;  Location: [facility];  Service: Urology;  Laterality: N/A;  . DILATION AND CURETTAGE OF UTERUS    . PUBOVAGINAL SLING N/A 11/01/2014   Procedure: REMOVAL VAGINAL SLING AND URETHROLYSIS AND CYSTO;  Surgeon: Bjorn Loser, MD;  Location: [facility];  Service: Urology;  Laterality: N/A;  . TUBAL LIGATION  1998  . TYMPANOSTOMY TUBE PLACEMENT  2010   right ear   . WISDOM TOOTH EXTRACTION      OB History    Gravida Para Term Preterm AB Living   6 4 4   2 4    SAB TAB Ectopic Multiple Live Births   2               Home Medications    Prior to Admission medications   Medication Sig Start Date End Date Taking? Authorizing Provider  albuterol (PROVENTIL) (2.5 MG/3ML) 0.083% nebulizer solution Take 2.5  mg by nebulization every 6 (six) hours as needed for wheezing or shortness of breath.   Yes [provider]  Albuterol Sulfate (PROAIR RESPICLICK) 220 (90 Base) MCG/ACT AEPB Inhale 2 puffs into the lungs every 4 (four) hours as needed. Patient taking differently: Inhale 2 puffs into the lungs 2 (two) times daily.  03/15/15  Yes Gean Quint, MD  beclomethasone (QVAR) 80 MCG/ACT inhaler Inhale 2 puffs into the lungs 2 (two) times daily. To prevent cough or wheeze.  Rinse, gargle and spit with water after use. Patient taking differently: Inhale 2 puffs into the lungs 2 (two) times daily as needed (SOB, wheezing). To prevent cough or wheeze.  Rinse, gargle and spit with water after use. 01/15/16  Yes Valentina Shaggy, MD  guaiFENesin-dextromethorphan [facility] DM) 100-10 MG/5ML syrup  Take 5 mLs by mouth every 4 (four) hours as needed for cough.   Yes [provider]  ibuprofen (ADVIL,MOTRIN) 200 MG tablet Take 400 mg by mouth every 6 (six) hours as needed for headache, mild pain or moderate pain.   Yes [provider]  levothyroxine (SYNTHROID, LEVOTHROID) 112 MCG tablet Take 1 tablet (112 mcg total) by mouth daily. 01/26/14  Yes Elayne Snare, MD  loratadine (CLARITIN) 10 MG tablet Take 10 mg by mouth daily as needed for allergies. Reported on 03/02/2015   Yes [provider]  Multiple Vitamin (MULTIVITAMIN) tablet Take 1 tablet by mouth daily.   Yes [provider]  triamcinolone (NASACORT AQ) 55 MCG/ACT AERO nasal inhaler Place 2 sprays into the nose as needed (allergies).   Yes [provider]  levofloxacin (LEVAQUIN) 500 MG tablet Take 1 tablet (500 mg total) by mouth daily. Patient not taking: Reported on 06/22/2016 05/14/16   Elgergawy, Silver Huguenin, MD  predniSONE (DELTASONE) 10 MG tablet Please take 40 mg oral daily for 3 days, then 30 mg oral daily for 3 days, then 20 mg oral daily for 3 days, then 10 mg oral daily for 3 days, then stop Patient not taking: Reported on 06/22/2016 05/14/16   Elgergawy, Silver Huguenin, MD    Family History Family History  Problem Relation Age of Onset  . Cancer Father        throat  . Cancer Maternal Aunt        breast  . Allergic rhinitis Neg Hx   . Asthma Neg Hx   . Eczema Neg Hx   . Immunodeficiency Neg Hx   . Urticaria Neg Hx     Social History Social History  Substance Use Topics  . Smoking status: Never Smoker  . Smokeless tobacco: Never Used  . Alcohol use No     Allergies   Aspirin; Ibuprofen; and Other   Review of Systems Review of Systems  All other systems reviewed and are negative.    Physical Exam Updated Vital Signs BP (!) 150/97 (BP Location: Left Arm)   Pulse (!) 101   Temp 97.8 F (36.6 C) (Oral)   Resp 16   SpO2 96%   Vital signs normal except for  tachycardia and mild hypertension   Physical Exam  Constitutional: She is oriented to person, place, and time. She appears well-developed and well-nourished.  Non-toxic appearance. She does not appear ill. No distress.  HENT:  Head: Normocephalic and atraumatic.  Right Ear: External ear normal.  Left Ear: External ear normal.  Nose: Nose normal. No mucosal edema or rhinorrhea.  Mouth/Throat: Oropharynx is clear and moist and mucous membranes are normal.  No dental abscesses or uvula swelling.  Eyes: Conjunctivae and EOM are normal. Pupils are equal, round, and reactive to light.  Neck: Normal range of motion and full passive range of motion without pain. Neck supple.  Cardiovascular: Normal rate, regular rhythm and normal heart sounds.  Exam reveals no gallop and no friction rub.   No murmur heard. Pulmonary/Chest: Effort normal. Tachypnea noted. No respiratory distress. She has decreased breath sounds. She has wheezes. She has no rhonchi. She has no rales. She exhibits no tenderness and no crepitus.  Patient is noted to have tight cough, she has inspiratory and expiratory wheezing and rhonchi.  Abdominal: Soft. Normal appearance and bowel sounds are normal. She exhibits no distension. There is no tenderness. There is no rebound and no guarding.  Musculoskeletal: Normal range of motion. She exhibits no edema or tenderness.  Moves all extremities well.   Neurological: She is alert and oriented to person, place, and time. She has normal strength. No cranial nerve deficit.  Skin: Skin is warm, dry and intact. No rash noted. No erythema. No pallor.  Psychiatric: She has a normal mood and affect. Her speech is normal and behavior is normal. Her mood appears not anxious.  Nursing note and vitals reviewed.    ED Treatments / Results  Labs (all labs ordered are listed, but only abnormal results are displayed) Labs Reviewed - No data to display  EKG  EKG Interpretation  Date/Time:  Saturday  Jun 22 2016 07:54:55 EDT Ventricular Rate:  112 PR Interval:    QRS Duration: 84 QT Interval:  359 QTC Calculation: 490 R Axis:   -57 Text Interpretation:  Sinus tachycardia Left anterior fascicular block Abnormal R-wave progression, late transition Borderline prolonged QT interval Since last tracing rate faster 25 Oct 2014 Confirmed by Rolland Porter [phone]) on 06/22/2016 8:02:40 AM       Radiology Dg Chest 2 View  Result Date: 06/22/2016 CLINICAL DATA:  Cough, chest tightness, and shortness of breath for 4 days, worse today. History of asthma. EXAM: CHEST  2 VIEW COMPARISON:  05/13/2016 FINDINGS: The heart size and mediastinal contours are within normal limits. Both lungs are clear. The visualized skeletal structures are unremarkable. IMPRESSION: No active cardiopulmonary disease. Electronically Signed   By: Lucienne Capers M.D.   On: 06/22/2016 04:32    Procedures Procedures (including critical care time)  Medications Ordered in ED Medications  magnesium sulfate IVPB 2 g 50 mL (2 g Intravenous New Bag/Given 06/22/16 0754)  albuterol (PROVENTIL,VENTOLIN) solution continuous neb (10 mg/hr Nebulization Given 06/22/16 0434)  ipratropium (ATROVENT) nebulizer solution 0.5 mg (0.5 mg Nebulization Given 06/22/16 0434)  predniSONE (DELTASONE) tablet 60 mg (60 mg Oral Given 06/22/16 0434)  albuterol (PROVENTIL,VENTOLIN) solution continuous neb (10 mg/hr Nebulization Given 06/22/16 0603)  ipratropium (ATROVENT) nebulizer solution 0.5 mg (0.5 mg Nebulization Given 06/22/16 0603)  albuterol (PROVENTIL) (2.5 MG/3ML) 0.083% nebulizer solution 5 mg (5 mg Nebulization Given 06/22/16 0748)     Initial Impression / Assessment and Plan / ED Course  I have reviewed the triage vital signs and the nursing notes.  Pertinent labs & imaging results that were available during my care of the patient were reviewed by me and considered in my medical decision making (see chart for details).   Patient was started on  a continuous nebulizer of 10 mg albuterol plus Atrovent 0.5 mg and she was started on oral prednisone.  Recheck at 7:30 AM patient states she's feeling better however she still wheezing. On exam  she still having diffuse expiratory rhonchi. We discussed admission however she would prefer to try IV magnesium first. IV magnesium was ordered.  08:00 AM Patient turned over to Dr Ellender Hose to see how she does after the IV magnesium.   Final Clinical Impressions(s) / ED Diagnoses   Final diagnoses:  Exacerbation of intermittent asthma, unspecified asthma severity    Disposition pending  Rolland Porter, MD, Barbette Or, MD 06/22/16 [phone]

## 2016-06-22 NOTE — H&P (Signed)
HISTORY AND PHYSICAL       PATIENT DETAILS Name: Angela [name] Age: 61 y.o. Sex: female Date of Birth: 04-Nov-1955 Admit Date: 06/22/2016 [facility]:SLHTDSKA, L.[name], MD   Patient coming from: Home   CHIEF COMPLAINT:  Shortness of breath  HPI: Angela [name] is a 61 y.o. female with medical history significant of hypothyroidism, asthma, anxiety who presented to the ED for evaluation of shortness of breath. Per patient, her grandson was sick with a virus a few days back, she had cough and cold symptoms for 1 day which quickly resolved. She then started slowly developing shortness of breath, initially she felt better after she used her inhalers/nebulizers. However the shortness of breath continued, she subsequently had difficulty in breathing and going upstairs. This is associated with cough with white phlegm. She denies any chest pain or fever. Because she was not getting better, she presented to the emergency room where she was given 2 rounds of one hour long albuterol nebulizer, steroids, magnesium sulfate following which she felt significantly better but not yet back to her baseline. The hospitalist service was then asked to admit this patient for further inpatient treatment and evaluation.  During my evaluation, she appeared very comfortable-in fact she had just come back after ambulating to the bathroom.  She denied any fever, headache, chest pain, nausea, vomiting, diarrhea, abdominal pain, dysuria.  ED Course:  See above  Note: Lives at: Home Mobility: Independent Chronic Indwelling Foley:no  REVIEW OF SYSTEMS:  Constitutional:   No  weight loss, night sweats,  Fevers, chills, fatigue.  HEENT:    No headaches, Dysphagia,Tooth/dental problems,Sore throat  Cardio-vascular: No chest pain,Orthopnea, PND,lower extremity edema, anasarca  GI:  No heartburn, indigestion, abdominal pain, nausea, vomiting, diarrhea, melena or hematochezia  Resp: No   hemoptysis,plueritic chest pain.   Skin:  No rash or lesions.  GU:  No dysuria, change in color of urine, no urgency or frequency.  No flank pain.  Musculoskeletal: No joint pain or swelling.  No decreased range of motion.  No back pain.  Endocrine: No heat intolerance, no cold intolerance, no polyuria, no polydipsia  Psych: No change in mood or affect. No depression or anxiety.  No memory loss.   ALLERGIES:   Allergies  Allergen Reactions  . Aspirin Other (See Comments)    Stomach ulcers   . Ibuprofen Other (See Comments)    Cautious by MD due to kidney function   . Other Other (See Comments)    NO BLOOD PRODUCTS - PT IN AGREEMENT FOR ALBUMIN OR ALBUMIN CONTAINING PRODUCTS PER CONSENT    PAST MEDICAL HISTORY: Past Medical History:  Diagnosis Date  . Anxiety   . Asthma   . Breast discharge   . Congestion of right ear   . Depression   . GERD (gastroesophageal reflux disease)   . Headache(784.0)   . Hyperlipidemia   . Hypothyroidism   . Seizures ([place])    hx of during childhood -not since age 38  . Sleep apnea    uses CPAP  . Thygeson's disease   . Thyroid disease   . Tinnitus   . Urticaria     PAST SURGICAL HISTORY: Past Surgical History:  Procedure Laterality Date  . ANTERIOR AND POSTERIOR REPAIR  2007  . BREAST DUCTAL SYSTEM EXCISION  01/18/2011   Procedure: EXCISION DUCTAL SYSTEM BREAST;  Surgeon: Belva Crome, MD;  Location: [facility];  Service: General;  Laterality: Right;  .  BREAST SURGERY  01/18/2011   Right BX  . CYSTOSCOPY WITH URETHROLYSIS N/A 11/01/2014   Procedure: CYSTOSCOPY WITH URETHROLYSIS;  Surgeon: Bjorn Loser, MD;  [facility];  Service: Urology;  Laterality: N/A;  . DILATION AND CURETTAGE OF UTERUS    . PUBOVAGINAL SLING N/A 11/01/2014   Procedure: REMOVAL VAGINAL SLING AND URETHROLYSIS AND CYSTO;  Surgeon: Bjorn Loser, MD;  [facility];  Service: Urology;  Laterality: N/A;  . TUBAL LIGATION  1998   . TYMPANOSTOMY TUBE PLACEMENT  2010   right ear   . WISDOM TOOTH EXTRACTION      MEDICATIONS AT HOME: Prior to Admission medications   Medication Sig Start Date End Date Taking? Authorizing Provider  albuterol (PROVENTIL) (2.5 MG/3ML) 0.083% nebulizer solution Take 2.5 mg by nebulization every 6 (six) hours as needed for wheezing or shortness of breath.   Yes [provider]  Albuterol Sulfate (PROAIR RESPICLICK) 846 (90 Base) MCG/ACT AEPB Inhale 2 puffs into the lungs every 4 (four) hours as needed. Patient taking differently: Inhale 2 puffs into the lungs 2 (two) times daily.  03/15/15  Yes Gean Quint, MD  beclomethasone (QVAR) 80 MCG/ACT inhaler Inhale 2 puffs into the lungs 2 (two) times daily. To prevent cough or wheeze.  Rinse, gargle and spit with water after use. Patient taking differently: Inhale 2 puffs into the lungs 2 (two) times daily as needed (SOB, wheezing). To prevent cough or wheeze.  Rinse, gargle and spit with water after use. 01/15/16  Yes Valentina Shaggy, MD  guaiFENesin-dextromethorphan [facility] DM) 100-10 MG/5ML syrup Take 5 mLs by mouth every 4 (four) hours as needed for cough.   Yes [provider]  ibuprofen (ADVIL,MOTRIN) 200 MG tablet Take 400 mg by mouth every 6 (six) hours as needed for headache, mild pain or moderate pain.   Yes [provider]  levothyroxine (SYNTHROID, LEVOTHROID) 112 MCG tablet Take 1 tablet (112 mcg total) by mouth daily. 01/26/14  Yes Elayne Snare, MD  loratadine (CLARITIN) 10 MG tablet Take 10 mg by mouth daily as needed for allergies. Reported on 03/02/2015   Yes [provider]  Multiple Vitamin (MULTIVITAMIN) tablet Take 1 tablet by mouth daily.   Yes [provider]  triamcinolone (NASACORT AQ) 55 MCG/ACT AERO nasal inhaler Place 2 sprays into the nose as needed (allergies).   Yes [provider]  levofloxacin (LEVAQUIN) 500 MG tablet Take 1 tablet (500 mg total) by mouth  daily. Patient not taking: Reported on 06/22/2016 05/14/16   Elgergawy, Silver Huguenin, MD  predniSONE (DELTASONE) 10 MG tablet Please take 40 mg oral daily for 3 days, then 30 mg oral daily for 3 days, then 20 mg oral daily for 3 days, then 10 mg oral daily for 3 days, then stop Patient not taking: Reported on 06/22/2016 05/14/16   Elgergawy, Silver Huguenin, MD    FAMILY HISTORY: Family History  Problem Relation Age of Onset  . Cancer Father        throat  . Cancer Maternal Aunt        breast  . Allergic rhinitis Neg Hx   . Asthma Neg Hx   . Eczema Neg Hx   . Immunodeficiency Neg Hx   . Urticaria Neg Hx     SOCIAL HISTORY:  reports that she has never smoked. She has never used smokeless tobacco. She reports that she does not drink alcohol or use drugs.  PHYSICAL EXAM: Blood pressure 125/80, pulse (!) 109, temperature  97.5 F (36.4 C), temperature source Oral, resp. rate 19, SpO2 96 %.  General appearance :Awake, alert, not in any distress. Speech Clear.  Eyes:, pupils equally reactive to light and accomodation,no scleral icterus. HEENT: Atraumatic and Normocephalic Neck: supple, no JVD. No cervical lymphadenopathy.  Resp:Good air entry bilaterally-but has coarse rhonchi all over CVS: S1 S2 regular, slightly tachycardic GI: Bowel sounds present, Non tender and not distended with no gaurding, rigidity or rebound.No organomegaly Extremities: B/L Lower Ext shows no edema, both legs are warm to touch Neurology:  speech clear,Non focal, sensation is grossly intact. Psychiatric: Normal judgment and insight. Alert and oriented x 3.  Musculoskeletal:No digital cyanosis Skin:No Rash, warm and dry Wounds:N/A  LABS ON ADMISSION:  I have personally reviewed following labs and imaging studies  CBC:  Recent Labs Lab 06/22/16 0911  WBC 9.2  HGB 14.2  HCT 41.2  MCV 72.3*  PLT 921    Basic Metabolic Panel:  Recent Labs Lab 06/22/16 0940  NA 141  K 3.2*  CL 109  CO2 20*  GLUCOSE 154*    BUN 9  CREATININE 0.90  CALCIUM 8.4*    GFR: CrCl cannot be calculated (Unknown ideal weight.).  Liver Function Tests: No results for input(s): AST, ALT, ALKPHOS, BILITOT, PROT, ALBUMIN in the last 168 hours. No results for input(s): LIPASE, AMYLASE in the last 168 hours. No results for input(s): AMMONIA in the last 168 hours.  Coagulation Profile: No results for input(s): INR, PROTIME in the last 168 hours.  Cardiac Enzymes: No results for input(s): CKTOTAL, CKMB, CKMBINDEX, TROPONINI in the last 168 hours.  BNP (last 3 results) No results for input(s): PROBNP in the last 8760 hours.  HbA1C: No results for input(s): HGBA1C in the last 72 hours.  CBG: No results for input(s): GLUCAP in the last 168 hours.  Lipid Profile: No results for input(s): CHOL, HDL, LDLCALC, TRIG, CHOLHDL, LDLDIRECT in the last 72 hours.  Thyroid Function Tests: No results for input(s): TSH, T4TOTAL, FREET4, T3FREE, THYROIDAB in the last 72 hours.  Anemia Panel: No results for input(s): VITAMINB12, FOLATE, FERRITIN, TIBC, IRON, RETICCTPCT in the last 72 hours.  Urine analysis:    Component Value Date/Time   COLORURINE YELLOW 07/20/2007 [place] 07/20/2007 0859   LABSPEC 1.020 07/20/2007 0859   PHURINE 5.0 07/20/2007 0859   GLUCOSEU NEGATIVE 07/20/2007 0859   HGBUR NEGATIVE 07/20/2007 0859   BILIRUBINUR NEGATIVE 07/20/2007 0859   KETONESUR NEGATIVE 07/20/2007 0859   PROTEINUR NEGATIVE 07/20/2007 0859   UROBILINOGEN 0.2 07/20/2007 0859   NITRITE NEGATIVE 07/20/2007 0859   LEUKOCYTESUR  07/20/2007 0859    NEGATIVE MICROSCOPIC NOT DONE ON URINES WITH NEGATIVE PROTEIN, BLOOD, LEUKOCYTES, NITRITE, OR GLUCOSE <1000 mg/dL.    Sepsis Labs: Lactic Acid, Venous No results found for: [place]   Microbiology: No results found for this or any previous visit (from the past 240 hour(s)).    RADIOLOGIC STUDIES ON ADMISSION: Dg Chest 2 View  Result Date:  06/22/2016 CLINICAL DATA:  Cough, chest tightness, and shortness of breath for 4 days, worse today. History of asthma. EXAM: CHEST  2 VIEW COMPARISON:  05/13/2016 FINDINGS: The heart size and mediastinal contours are within normal limits. Both lungs are clear. The visualized skeletal structures are unremarkable. IMPRESSION: No active cardiopulmonary disease. Electronically Signed   By: Lucienne Capers M.D.   On: 06/22/2016 04:32    I have personally reviewed images of chest xray -No obvious pneumonia  EKG:  Personally reviewed.  Sinus tachycardia  ASSESSMENT AND PLAN: Asthma exacerbation: Already improving in the emergency room-able to ambulate but still with coarse rhonchi and still not back to her baseline. We will admit to the medical surgical unit-start IV Solu-Medrol 60 mg IV every 8, scheduled bronchodilators every 6 hours-and other supportive measures including Mucinex. Clinical course will be monitored closely, medications will be adjusted as needed. Since no indication of infection with negative chest x-ray-I do not see any role for antibiotics at this time.  Hypothyroidism: Continue Synthroid  Anxiety: Currently appears stable-start as needed Xanax  Further plan will depend as patient's clinical course evolves and further radiologic and laboratory data become available. Patient will be monitored closely.  Above noted plan was discussed with patient face to face at bedside, she was in agreement.   CONSULTS: None  DVT Prophylaxis: Prophylactic Lovenox  Code Status: Full Code  Disposition Plan:  Discharge back home possibly in 1-2 days, depending on clinical course  Admission status: Inpatient  going to  medical floor  Total time spent  55 minutes.Greater than 50% of this time was spent in counseling, explanation of diagnosis, planning of further management, and coordination of care.  Oren Binet [facility] Pager [phone]  If 7PM-7AM, please contact  night-coverage [email or web address] Password [facility] 06/22/2016, 11:33 AM

## 2016-06-23 DIAGNOSIS — J45901 Unspecified asthma with (acute) exacerbation: Secondary | ICD-10-CM

## 2016-06-23 DIAGNOSIS — E039 Hypothyroidism, unspecified: Secondary | ICD-10-CM

## 2016-06-23 DIAGNOSIS — J4521 Mild intermittent asthma with (acute) exacerbation: Principal | ICD-10-CM

## 2016-06-23 LAB — BASIC METABOLIC PANEL
Anion gap: 9 (ref 5–15)
BUN: 10 mg/dL (ref 6–20)
CO2: 21 mmol/L — ABNORMAL LOW (ref 22–32)
Calcium: 9.2 mg/dL (ref 8.9–10.3)
Chloride: 108 mmol/L (ref 101–111)
Creatinine, Ser: 0.76 mg/dL (ref 0.44–1.00)
GFR calc Af Amer: >60 mL/min (ref >60)
GFR calc non Af Amer: >60 mL/min (ref >60)
Glucose, Bld: 135 mg/dL — ABNORMAL HIGH (ref 65–99)
Potassium: 4.8 mmol/L (ref 3.5–5.1)
Sodium: 138 mmol/L (ref 135–145)

## 2016-06-23 LAB — CBC
HCT: 42 % (ref 36.0–46.0)
Hemoglobin: 14.1 g/dL (ref 12.0–15.0)
MCH: 24.5 pg — ABNORMAL LOW (ref 26.0–34.0)
MCHC: 33.6 g/dL (ref 30.0–36.0)
MCV: 72.9 fL — ABNORMAL LOW (ref 78.0–100.0)
Platelets: 325 10*3/uL (ref 150–400)
RBC: 5.76 MIL/uL — ABNORMAL HIGH (ref 3.87–5.11)
RDW: 16.3 % — ABNORMAL HIGH (ref 11.5–15.5)
WBC: 14.5 10*3/uL — ABNORMAL HIGH (ref 4.0–10.5)

## 2016-06-23 MED ORDER — PREDNISONE 10 MG PO TABS: ORAL_TABLET | ORAL | 0 refills | Status: DC

## 2016-06-23 NOTE — Progress Notes (Signed)
Patient Discharged Home. Discharge instructions  provided to the patient which included instructions that addressed activity level, diet, discharge medications, follow-up appointments, weight monitoring and what to do if symptoms worsen. All patients Questions answered.   

## 2016-06-23 NOTE — Discharge Summary (Signed)
Physician Discharge Summary  Angela [name]:[id number] DOB: May 02, 1955 DOA: 06/22/2016  PCP: Alroy Dust, L.[name], MD  Admit date: 06/22/2016 Discharge date: 06/23/2016  Admitted From: home Disposition:  Home  Recommendations for Outpatient Follow-up:  1. Follow up with PCP in 1-2 weeks, She will need PFTs and outpatient to determine if this is asthma versus some other etiology.   Home Health:No Equipment/Devices:none    Discharge Condition:astable CODE STATUS:full Diet recommendation: Heart Healthy  Brief/Interim Summary: 61 y.o. female past medical history of hypothyroidism and anxiety presents to the ED for shortness of breath that started one day prior to admission. Per grandson she is been sick not feeling well a few days back with a cough and some cold symptoms but slowly started developing shortness of breath.  Discharge Diagnoses:  Principal Problem:   Asthma exacerbation Active Problems:   Hypothyroidism   Anxiety   Asthma exacerbation: Started on IV Solu-Medrol inhalers and supportive measures, on admission 1124 hrs. she was definitely better. Chest x-ray does not show any infiltrates. She has remained afebrile and no leukocytosis on admission. She currently has leukocytosis due to steroids. Saturations were checked with ambulation which remains stable she will go home on a steroid taper. She'll need to follow-up with pulmonary as an outpatient PFTs, as she relates she developed asthma after 61 years old which is very unlikely.  Hypothyroidism Continue Synthroid.  Anxiety: Continue Xanax. She appears to be stable.  Discharge Instructions  Discharge Instructions    Diet - low sodium heart healthy    Complete by:  As directed    Increase activity slowly    Complete by:  As directed      Allergies as of 06/23/2016      Reactions   Aspirin Other (See Comments)   Stomach ulcers    Ibuprofen Other (See Comments)   Cautious by MD due to kidney function    Other Other (See Comments)   NO BLOOD PRODUCTS - PT IN AGREEMENT FOR ALBUMIN OR ALBUMIN CONTAINING PRODUCTS PER CONSENT      Medication List    STOP taking these medications   levofloxacin 500 MG tablet Commonly known as:  LEVAQUIN     TAKE these medications   albuterol (2.5 MG/3ML) 0.083% nebulizer solution Commonly known as:  PROVENTIL Take 2.5 mg by nebulization every 6 (six) hours as needed for wheezing or shortness of breath. What changed:  Another medication with the same name was changed. Make sure you understand how and when to take each.   Albuterol Sulfate 108 (90 Base) MCG/ACT Aepb Commonly known as:  PROAIR RESPICLICK Inhale 2 puffs into the lungs every 4 (four) hours as needed. What changed:  when to take this   beclomethasone 80 MCG/ACT inhaler Commonly known as:  QVAR Inhale 2 puffs into the lungs 2 (two) times daily. To prevent cough or wheeze.  Rinse, gargle and spit with water after use. What changed:  when to take this  reasons to take this  additional instructions   guaiFENesin-dextromethorphan 100-10 MG/5ML syrup Commonly known as:  ROBITUSSIN DM Take 5 mLs by mouth every 4 (four) hours as needed for cough.   ibuprofen 200 MG tablet Commonly known as:  ADVIL,MOTRIN Take 400 mg by mouth every 6 (six) hours as needed for headache, mild pain or moderate pain.   levothyroxine 112 MCG tablet Commonly known as:  SYNTHROID, LEVOTHROID Take 1 tablet (112 mcg total) by mouth daily.   loratadine 10 MG tablet Commonly known as:  CLARITIN Take 10 mg by mouth daily as needed for allergies. Reported on 03/02/2015   multivitamin tablet Take 1 tablet by mouth daily.   NASACORT AQ 55 MCG/ACT Aero nasal inhaler Generic drug:  triamcinolone Place 2 sprays into the nose as needed (allergies).   predniSONE 10 MG tablet Commonly known as:  DELTASONE Takes 6 tablets for 1 days, then 5 tablets for 1 days, then 4 tablets for 1 days, then 3 tablets for 1 days, then  2 tabs for 1 days, then 1 tab for 1 days, and then stop. What changed:  additional instructions       Allergies  Allergen Reactions  . Aspirin Other (See Comments)    Stomach ulcers   . Ibuprofen Other (See Comments)    Cautious by MD due to kidney function   . Other Other (See Comments)    NO BLOOD PRODUCTS - PT IN AGREEMENT FOR ALBUMIN OR ALBUMIN CONTAINING PRODUCTS PER CONSENT    Consultations:  None   Procedures/Studies: Dg Chest 2 View  Result Date: 06/22/2016 CLINICAL DATA:  Cough, chest tightness, and shortness of breath for 4 days, worse today. History of asthma. EXAM: CHEST  2 VIEW COMPARISON:  05/13/2016 FINDINGS: The heart size and mediastinal contours are within normal limits. Both lungs are clear. The visualized skeletal structures are unremarkable. IMPRESSION: No active cardiopulmonary disease. Electronically Signed   By: Lucienne Capers M.D.   On: 06/22/2016 04:32     Subjective: She has no complaints feels great able to walk to the bathroom without any difficulties.  Discharge Exam: Vitals:   06/22/16 2020 06/23/16 0555  BP: (!) 138/94 (!) 145/91  Pulse: (!) 108 85  Resp: 18 18  Temp: 98.5 F (36.9 C) 97.7 F (36.5 C)   Vitals:   06/22/16 2004 06/22/16 2020 06/23/16 0555 06/23/16 0756  BP:  (!) 138/94 (!) 145/91   Pulse:  (!) 108 85   Resp:  18 18   Temp:  98.5 F (36.9 C) 97.7 F (36.5 C)   TempSrc:  Oral Oral   SpO2: 95% 95% 94% 94%  Weight:      Height:        General:Alert and oriented 3 Cardiovascular: Regular rate and rhythm with positive S1 and S2 Respiratory: Good air movement clear to auscultation. Abdominal: Positive bowel sounds soft nontender nondistended Extremities: No edema or cyanosis.    The results of significant diagnostics from this hospitalization (including imaging, microbiology, ancillary and laboratory) are listed below for reference.     Microbiology: No results found for this or any previous visit (from the  past 240 hour(s)).   Labs: BNP (last 3 results) No results for input(s): BNP in the last 8760 hours. Basic Metabolic Panel:  Recent Labs Lab 06/22/16 0940 06/23/16 0503  NA 141 138  K 3.2* 4.8  CL 109 108  CO2 20* 21*  GLUCOSE 154* 135*  BUN 9 10  CREATININE 0.90 0.76  CALCIUM 8.4* 9.2   Liver Function Tests: No results for input(s): AST, ALT, ALKPHOS, BILITOT, PROT, ALBUMIN in the last 168 hours. No results for input(s): LIPASE, AMYLASE in the last 168 hours. No results for input(s): AMMONIA in the last 168 hours. CBC:  Recent Labs Lab 06/22/16 0911 06/23/16 0503  WBC 9.2 14.5*  HGB 14.2 14.1  HCT 41.2 42.0  MCV 72.3* 72.9*  PLT 317 325   Cardiac Enzymes: No results for input(s): CKTOTAL, CKMB, CKMBINDEX, TROPONINI in the last 168 hours. BNP: Invalid  input(s): POCBNP CBG: No results for input(s): GLUCAP in the last 168 hours. D-Dimer No results for input(s): DDIMER in the last 72 hours. Hgb A1c No results for input(s): HGBA1C in the last 72 hours. Lipid Profile No results for input(s): CHOL, HDL, LDLCALC, TRIG, CHOLHDL, LDLDIRECT in the last 72 hours. Thyroid function studies No results for input(s): TSH, T4TOTAL, T3FREE, THYROIDAB in the last 72 hours.  Invalid input(s): FREET3 Anemia work up No results for input(s): VITAMINB12, FOLATE, FERRITIN, TIBC, IRON, RETICCTPCT in the last 72 hours. Urinalysis    Component Value Date/Time   COLORURINE YELLOW 07/20/2007 [place] 07/20/2007 0859   LABSPEC 1.020 07/20/2007 0859   PHURINE 5.0 07/20/2007 0859   GLUCOSEU NEGATIVE 07/20/2007 0859   HGBUR NEGATIVE 07/20/2007 0859   BILIRUBINUR NEGATIVE 07/20/2007 0859   KETONESUR NEGATIVE 07/20/2007 0859   PROTEINUR NEGATIVE 07/20/2007 0859   UROBILINOGEN 0.2 07/20/2007 0859   NITRITE NEGATIVE 07/20/2007 0859   LEUKOCYTESUR  07/20/2007 0859    NEGATIVE MICROSCOPIC NOT DONE ON URINES WITH NEGATIVE PROTEIN, BLOOD, LEUKOCYTES, NITRITE, OR GLUCOSE  <1000 mg/dL.   Sepsis Labs Invalid input(s): PROCALCITONIN,  WBC,  LACTICIDVEN Microbiology No results found for this or any previous visit (from the past 240 hour(s)).   Time coordinating discharge: Over 30 minutes  SIGNED:   Charlynne Cousins, MD  [facility] 06/23/2016, 9:45 AM Pager   If 7PM-7AM, please contact night-coverage [email or web address] Password [email or web address]

## 2016-08-09 ENCOUNTER — Other Ambulatory Visit: Payer: Self-pay

## 2016-08-09 ENCOUNTER — Encounter (HOSPITAL_COMMUNITY): Payer: Self-pay | Admitting: Oncology

## 2016-08-09 ENCOUNTER — Emergency Department (HOSPITAL_COMMUNITY): Payer: Self-pay

## 2016-08-09 DIAGNOSIS — R000 Tachycardia, unspecified: Secondary | ICD-10-CM | POA: Insufficient documentation

## 2016-08-09 DIAGNOSIS — Z7951 Long term (current) use of inhaled steroids: Secondary | ICD-10-CM | POA: Insufficient documentation

## 2016-08-09 DIAGNOSIS — R030 Elevated blood-pressure reading, without diagnosis of hypertension: Secondary | ICD-10-CM | POA: Insufficient documentation

## 2016-08-09 DIAGNOSIS — G4730 Sleep apnea, unspecified: Secondary | ICD-10-CM | POA: Insufficient documentation

## 2016-08-09 DIAGNOSIS — K219 Gastro-esophageal reflux disease without esophagitis: Secondary | ICD-10-CM | POA: Insufficient documentation

## 2016-08-09 DIAGNOSIS — J45901 Unspecified asthma with (acute) exacerbation: Principal | ICD-10-CM | POA: Insufficient documentation

## 2016-08-09 DIAGNOSIS — E039 Hypothyroidism, unspecified: Secondary | ICD-10-CM | POA: Insufficient documentation

## 2016-08-09 DIAGNOSIS — Z9989 Dependence on other enabling machines and devices: Secondary | ICD-10-CM | POA: Insufficient documentation

## 2016-08-09 DIAGNOSIS — Z79899 Other long term (current) drug therapy: Secondary | ICD-10-CM | POA: Insufficient documentation

## 2016-08-09 DIAGNOSIS — E876 Hypokalemia: Secondary | ICD-10-CM | POA: Insufficient documentation

## 2016-08-09 MED ORDER — ALBUTEROL SULFATE (2.5 MG/3ML) 0.083% IN NEBU
5.0000 mg | INHALATION_SOLUTION | Freq: Once | RESPIRATORY_TRACT | Status: AC
  Administered 2016-08-09: 5 mg via RESPIRATORY_TRACT
  Filled 2016-08-09: qty 6

## 2016-08-09 NOTE — ED Triage Notes (Signed)
Pt reports shob x 1 week.  Per pt she has hx of asthma.  Pt has been taking home neb tx Q4 hours w/o relief.  Pt is able to speak in full sentences.  Per pt the shob has gotten progressively worse today.  Pt also c/o centralized CP x one week.  Pt has a productive cough as well.

## 2016-08-10 ENCOUNTER — Encounter (HOSPITAL_COMMUNITY): Payer: Self-pay

## 2016-08-10 ENCOUNTER — Observation Stay (HOSPITAL_COMMUNITY)
Admission: EM | Admit: 2016-08-10 | Discharge: 2016-08-11 | Disposition: A | Discharge status: Home / Self Care | Payer: Self-pay | Attending: Internal Medicine | Admitting: Internal Medicine

## 2016-08-10 DIAGNOSIS — K219 Gastro-esophageal reflux disease without esophagitis: Secondary | ICD-10-CM

## 2016-08-10 DIAGNOSIS — R030 Elevated blood-pressure reading, without diagnosis of hypertension: Secondary | ICD-10-CM

## 2016-08-10 DIAGNOSIS — J45901 Unspecified asthma with (acute) exacerbation: Secondary | ICD-10-CM

## 2016-08-10 LAB — BASIC METABOLIC PANEL
Anion gap: 8 (ref 5–15)
BUN: 11 mg/dL (ref 6–20)
CO2: 26 mmol/L (ref 22–32)
Calcium: 9.1 mg/dL (ref 8.9–10.3)
Chloride: 109 mmol/L (ref 101–111)
Creatinine, Ser: 0.88 mg/dL (ref 0.44–1.00)
GFR calc Af Amer: >60 mL/min (ref >60)
GFR calc non Af Amer: >60 mL/min (ref >60)
Glucose, Bld: 115 mg/dL — ABNORMAL HIGH (ref 65–99)
Potassium: 3.4 mmol/L — ABNORMAL LOW (ref 3.5–5.1)
Sodium: 143 mmol/L (ref 135–145)

## 2016-08-10 LAB — CBC WITH DIFFERENTIAL/PLATELET
Basophils Absolute: 0 10*3/uL (ref 0.0–0.1)
Basophils Relative: 1 %
Eosinophils Absolute: 0.5 10*3/uL (ref 0.0–0.7)
Eosinophils Relative: 6 %
HCT: 42 % (ref 36.0–46.0)
Hemoglobin: 14.3 g/dL (ref 12.0–15.0)
Lymphocytes Relative: 29 %
Lymphs Abs: 2.6 10*3/uL (ref 0.7–4.0)
MCH: 24.5 pg — ABNORMAL LOW (ref 26.0–34.0)
MCHC: 34 g/dL (ref 30.0–36.0)
MCV: 72 fL — ABNORMAL LOW (ref 78.0–100.0)
Monocytes Absolute: 0.5 10*3/uL (ref 0.1–1.0)
Monocytes Relative: 5 %
Neutro Abs: 5.2 10*3/uL (ref 1.7–7.7)
Neutrophils Relative %: 59 %
Platelets: 273 10*3/uL (ref 150–400)
RBC: 5.83 MIL/uL — ABNORMAL HIGH (ref 3.87–5.11)
RDW: 15.8 % — ABNORMAL HIGH (ref 11.5–15.5)
WBC: 8.8 10*3/uL (ref 4.0–10.5)

## 2016-08-10 MED ORDER — ALBUTEROL (5 MG/ML) CONTINUOUS INHALATION SOLN
10.0000 mg/h | INHALATION_SOLUTION | Freq: Once | RESPIRATORY_TRACT | Status: AC
  Administered 2016-08-10: 10 mg/h via RESPIRATORY_TRACT

## 2016-08-10 MED ORDER — METHYLPREDNISOLONE SODIUM SUCC 125 MG IJ SOLR
125.0000 mg | Freq: Once | INTRAMUSCULAR | Status: AC
  Administered 2016-08-10: 125 mg via INTRAVENOUS
  Filled 2016-08-10: qty 2

## 2016-08-10 MED ORDER — ONDANSETRON HCL 4 MG/2ML IJ SOLN
4.0000 mg | Freq: Four times a day (QID) | INTRAMUSCULAR | Status: DC | PRN
Start: 2016-08-10 — End: 2016-08-11
  Administered 2016-08-10 – 2016-08-11 (×2): 4 mg via INTRAVENOUS
  Filled 2016-08-10 (×3): qty 2

## 2016-08-10 MED ORDER — BUDESONIDE 0.5 MG/2ML IN SUSP
0.5000 mg | Freq: Two times a day (BID) | RESPIRATORY_TRACT | Status: DC
  Administered 2016-08-10 – 2016-08-11 (×2): 0.5 mg via RESPIRATORY_TRACT
  Filled 2016-08-10 (×3): qty 2

## 2016-08-10 MED ORDER — IPRATROPIUM-ALBUTEROL 0.5-2.5 (3) MG/3ML IN SOLN
3.0000 mL | Freq: Four times a day (QID) | RESPIRATORY_TRACT | Status: DC
  Administered 2016-08-10 (×2): 3 mL via RESPIRATORY_TRACT
  Filled 2016-08-10 (×3): qty 3

## 2016-08-10 MED ORDER — MAGNESIUM SULFATE 2 GM/50ML IV SOLN
2.0000 g | Freq: Once | INTRAVENOUS | Status: AC
  Administered 2016-08-10: 2 g via INTRAVENOUS
  Filled 2016-08-10: qty 50

## 2016-08-10 MED ORDER — ALBUTEROL (5 MG/ML) CONTINUOUS INHALATION SOLN
10.0000 mg/h | INHALATION_SOLUTION | Freq: Once | RESPIRATORY_TRACT | Status: AC
  Administered 2016-08-10: 10 mg/h via RESPIRATORY_TRACT
  Filled 2016-08-10: qty 20

## 2016-08-10 MED ORDER — IPRATROPIUM-ALBUTEROL 0.5-2.5 (3) MG/3ML IN SOLN
3.0000 mL | Freq: Three times a day (TID) | RESPIRATORY_TRACT | Status: DC
  Filled 2016-08-10: qty 3

## 2016-08-10 MED ORDER — DM-GUAIFENESIN ER 30-600 MG PO TB12
1.0000 | ORAL_TABLET | Freq: Two times a day (BID) | ORAL | Status: DC
  Administered 2016-08-10 – 2016-08-11 (×3): 1 via ORAL
  Filled 2016-08-10 (×3): qty 1

## 2016-08-10 MED ORDER — METHYLPREDNISOLONE SODIUM SUCC 125 MG IJ SOLR
125.0000 mg | Freq: Three times a day (TID) | INTRAMUSCULAR | Status: DC
  Administered 2016-08-10 – 2016-08-11 (×3): 125 mg via INTRAVENOUS
  Filled 2016-08-10 (×3): qty 2

## 2016-08-10 MED ORDER — SENNOSIDES-DOCUSATE SODIUM 8.6-50 MG PO TABS
1.0000 | ORAL_TABLET | Freq: Once | ORAL | Status: AC
Start: 2016-08-10 — End: 2016-08-10
  Administered 2016-08-10: 1 via ORAL
  Filled 2016-08-10: qty 1

## 2016-08-10 MED ORDER — IPRATROPIUM BROMIDE 0.02 % IN SOLN
0.5000 mg | Freq: Once | RESPIRATORY_TRACT | Status: AC
  Administered 2016-08-10: 0.5 mg via RESPIRATORY_TRACT
  Filled 2016-08-10: qty 2.5

## 2016-08-10 MED ORDER — ENOXAPARIN SODIUM 40 MG/0.4ML ~~LOC~~ SOLN
40.0000 mg | SUBCUTANEOUS | Status: DC
  Administered 2016-08-10 – 2016-08-11 (×2): 40 mg via SUBCUTANEOUS
  Filled 2016-08-10 (×2): qty 0.4

## 2016-08-10 MED ORDER — IPRATROPIUM BROMIDE 0.02 % IN SOLN
0.5000 mg | RESPIRATORY_TRACT | Status: DC
  Filled 2016-08-10: qty 2.5

## 2016-08-10 MED ORDER — LEVOTHYROXINE SODIUM 112 MCG PO TABS
112.0000 ug | ORAL_TABLET | Freq: Every day | ORAL | Status: DC
  Administered 2016-08-10 – 2016-08-11 (×2): 112 ug via ORAL
  Filled 2016-08-10 (×2): qty 1

## 2016-08-10 MED ORDER — LORATADINE 10 MG PO TABS
10.0000 mg | ORAL_TABLET | Freq: Every day | ORAL | Status: DC
  Administered 2016-08-10 – 2016-08-11 (×2): 10 mg via ORAL
  Filled 2016-08-10 (×2): qty 1

## 2016-08-10 MED ORDER — ACETAMINOPHEN 325 MG PO TABS
650.0000 mg | ORAL_TABLET | Freq: Four times a day (QID) | ORAL | Status: DC | PRN
  Administered 2016-08-10: 650 mg via ORAL
  Filled 2016-08-10: qty 2

## 2016-08-10 MED ORDER — ALBUTEROL SULFATE (2.5 MG/3ML) 0.083% IN NEBU
5.0000 mg | INHALATION_SOLUTION | Freq: Once | RESPIRATORY_TRACT | Status: AC
  Administered 2016-08-10: 5 mg via RESPIRATORY_TRACT
  Filled 2016-08-10: qty 6

## 2016-08-10 MED ORDER — POTASSIUM CHLORIDE CRYS ER 20 MEQ PO TBCR
40.0000 meq | EXTENDED_RELEASE_TABLET | Freq: Once | ORAL | Status: AC
  Administered 2016-08-10: 40 meq via ORAL
  Filled 2016-08-10: qty 2

## 2016-08-10 MED ORDER — ALBUTEROL SULFATE (2.5 MG/3ML) 0.083% IN NEBU
5.0000 mg | INHALATION_SOLUTION | RESPIRATORY_TRACT | Status: DC | PRN
  Filled 2016-08-10: qty 6

## 2016-08-10 MED ORDER — BECLOMETHASONE DIPROPIONATE 80 MCG/ACT IN AERS: 2.0000 | INHALATION_SPRAY | Freq: Two times a day (BID) | RESPIRATORY_TRACT | Status: DC

## 2016-08-10 MED ORDER — ALBUTEROL SULFATE (2.5 MG/3ML) 0.083% IN NEBU: 5.0000 mg | INHALATION_SOLUTION | RESPIRATORY_TRACT | Status: DC | PRN

## 2016-08-10 NOTE — ED Notes (Signed)
hospitalist at bedside

## 2016-08-10 NOTE — H&P (Signed)
History and Physical    Angela [name] MHD:[id number] DOB: 1956-01-14 DOA: 08/10/2016  PCP: Alroy Dust, L.[name], MD  Patient coming from: Home  Chief Complaint: Wheezing and SOB  HPI: Angela [name] is a 61 y.o. female with medical history significant of asthma with frequent exacerbation, hypothyroidism who presents with wheezing and shortness of breath. She states that over the past few months, she has had few episodes of asthma exacerbations. Last week, she was at a cookout and was exposed to smoke. Since then she has had progressively worsening shortness of breath, wheezing, labored breathing. She also has had increased need for her rescue inhalers at home. She denies any fevers or chills, but has had productive cough with yellow sputum, no nausea, vomiting, diarrhea or abdominal pain.  ED Course: CXR did not show cardiopulmonary disease. She was given albuterol neb treatment and mag sulfate and referred to [facility] for observation for asthma exacerbation.   Review of Systems: As per HPI otherwise 10 point review of systems negative.   Past Medical History:  Diagnosis Date  . Anxiety   . Asthma   . Breast discharge   . Congestion of right ear   . Depression   . GERD (gastroesophageal reflux disease)   . Headache(784.0)   . Hyperlipidemia   . Hypothyroidism   . Seizures ([place])    hx of during childhood -not since age 89  . Sleep apnea    uses CPAP  . Thygeson's disease   . Thyroid disease   . Tinnitus   . Urticaria     Past Surgical History:  Procedure Laterality Date  . ANTERIOR AND POSTERIOR REPAIR  2007  . BREAST DUCTAL SYSTEM EXCISION  01/18/2011   Procedure: EXCISION DUCTAL SYSTEM BREAST;  Surgeon: Belva Crome, MD;  Location: [facility];  Service: General;  Laterality: Right;  . BREAST SURGERY  01/18/2011   Right BX  . CYSTOSCOPY WITH URETHROLYSIS N/A 11/01/2014   Procedure: CYSTOSCOPY WITH URETHROLYSIS;  Surgeon: Bjorn Loser, MD;  Location: [facility];   Service: Urology;  Laterality: N/A;  . DILATION AND CURETTAGE OF UTERUS    . PUBOVAGINAL SLING N/A 11/01/2014   Procedure: REMOVAL VAGINAL SLING AND URETHROLYSIS AND CYSTO;  Surgeon: Bjorn Loser, MD;  Location: [facility];  Service: Urology;  Laterality: N/A;  . TUBAL LIGATION  1998  . TYMPANOSTOMY TUBE PLACEMENT  2010   right ear   . WISDOM TOOTH EXTRACTION       reports that she has never smoked. She has never used smokeless tobacco. She reports that she does not drink alcohol or use drugs.  Allergies  Allergen Reactions  . Aspirin Other (See Comments)    Stomach ulcers   . Ibuprofen Other (See Comments)    Cautious by MD due to kidney function   . Other Other (See Comments)    NO BLOOD PRODUCTS - PT IN AGREEMENT FOR ALBUMIN OR ALBUMIN CONTAINING PRODUCTS PER CONSENT    Family History  Problem Relation Age of Onset  . Cancer Father        throat  . Cancer Maternal Aunt        breast  . Allergic rhinitis Neg Hx   . Asthma Neg Hx   . Eczema Neg Hx   . Immunodeficiency Neg Hx   . Urticaria Neg Hx     Prior to Admission medications   Medication Sig Start Date End Date Taking? Authorizing Provider  albuterol (PROVENTIL) (2.5 MG/3ML) 0.083% nebulizer  solution Take 2.5 mg by nebulization every 6 (six) hours as needed for wheezing or shortness of breath.   Yes [provider]  Albuterol Sulfate (PROAIR RESPICLICK) 742 (90 Base) MCG/ACT AEPB Inhale 2 puffs into the lungs every 4 (four) hours as needed. Patient taking differently: Inhale 2 puffs into the lungs every 6 (six) hours as needed (SOB, wheezing).  03/15/15  Yes Gean Quint, MD  beclomethasone (QVAR) 80 MCG/ACT inhaler Inhale 2 puffs into the lungs 2 (two) times daily. To prevent cough or wheeze.  Rinse, gargle and spit with water after use. 01/15/16  Yes Valentina Shaggy, MD  fexofenadine (ALLEGRA) 180 MG tablet Take 180 mg by mouth daily.   Yes [provider]  ibuprofen (ADVIL,MOTRIN) 200 MG  tablet Take 400 mg by mouth every 6 (six) hours as needed for headache, mild pain or moderate pain.   Yes [provider]  levothyroxine (SYNTHROID, LEVOTHROID) 112 MCG tablet Take 1 tablet (112 mcg total) by mouth daily. 01/26/14  Yes Elayne Snare, MD  Multiple Vitamin (MULTIVITAMIN) tablet Take 1 tablet by mouth daily.   Yes [provider]  triamcinolone (NASACORT AQ) 55 MCG/ACT AERO nasal inhaler Place 2 sprays into the nose as needed (allergies).   Yes [provider]  predniSONE (DELTASONE) 10 MG tablet Takes 6 tablets for 1 days, then 5 tablets for 1 days, then 4 tablets for 1 days, then 3 tablets for 1 days, then 2 tabs for 1 days, then 1 tab for 1 days, and then stop. Patient not taking: Reported on 08/10/2016 06/23/16   Charlynne Cousins, MD    Physical Exam: Vitals:   08/10/16 0528 08/10/16 0630 08/10/16 0800 08/10/16 0817  BP:  (!) 132/97 138/75   Pulse:  (!) 103 (!) 106   Resp:  (!) 23 18   Temp:   97.9 F (36.6 C)   TempSrc:      SpO2: 94% 97% 98% 95%  Weight:   103.6 kg (228 lb 4.8 oz)   Height:   5\' 5"  (1.651 m)      Constitutional: NAD, calm, comfortable Eyes: PERRL, lids and conjunctivae normal ENMT: Mucous membranes are moist. Posterior pharynx clear of any exudate or lesions.Normal dentition.  Neck: normal, supple, no masses, no thyromegaly Respiratory:+wheezing bilateral upper lobes. Normal respiratory effort. No accessory muscle use.  Cardiovascular: Regular rate and rhythm, no murmurs / rubs / gallops. No extremity edema.  Abdomen: no tenderness, no masses palpated. No hepatosplenomegaly. Bowel sounds positive.  Musculoskeletal: no clubbing / cyanosis. No joint deformity upper and lower extremities. Good ROM, no contractures. Normal muscle tone.  Skin: no rashes, lesions, ulcers. No induration Neurologic: CN 2-12 grossly intact. Strength 5/5 in all 4.  Psychiatric: Normal judgment and insight. Alert and oriented x 3. Normal mood.    Labs on Admission: I have personally reviewed following labs and imaging studies  CBC:  Recent Labs Lab 08/10/16 0103  WBC 8.8  NEUTROABS 5.2  HGB 14.3  HCT 42.0  MCV 72.0*  PLT 595   Basic Metabolic Panel:  Recent Labs Lab 08/10/16 0103  NA 143  K 3.4*  CL 109  CO2 26  GLUCOSE 115*  BUN 11  CREATININE 0.88  CALCIUM 9.1   GFR: Estimated Creatinine Clearance: 82.2 mL/min (by C-G formula based on SCr of 0.88 mg/dL). Liver Function Tests: No results for input(s): AST, ALT, ALKPHOS, BILITOT, PROT, ALBUMIN in the last 168 hours. No results for input(s): LIPASE, AMYLASE in  the last 168 hours. No results for input(s): AMMONIA in the last 168 hours. Coagulation Profile: No results for input(s): INR, PROTIME in the last 168 hours. Cardiac Enzymes: No results for input(s): CKTOTAL, CKMB, CKMBINDEX, TROPONINI in the last 168 hours. BNP (last 3 results) No results for input(s): PROBNP in the last 8760 hours. HbA1C: No results for input(s): HGBA1C in the last 72 hours. CBG: No results for input(s): GLUCAP in the last 168 hours. Lipid Profile: No results for input(s): CHOL, HDL, LDLCALC, TRIG, CHOLHDL, LDLDIRECT in the last 72 hours. Thyroid Function Tests: No results for input(s): TSH, T4TOTAL, FREET4, T3FREE, THYROIDAB in the last 72 hours. Anemia Panel: No results for input(s): VITAMINB12, FOLATE, FERRITIN, TIBC, IRON, RETICCTPCT in the last 72 hours. Urine analysis:    Component Value Date/Time   COLORURINE YELLOW 07/20/2007 [place] 07/20/2007 0859   LABSPEC 1.020 07/20/2007 0859   PHURINE 5.0 07/20/2007 0859   GLUCOSEU NEGATIVE 07/20/2007 0859   HGBUR NEGATIVE 07/20/2007 0859   BILIRUBINUR NEGATIVE 07/20/2007 0859   KETONESUR NEGATIVE 07/20/2007 0859   PROTEINUR NEGATIVE 07/20/2007 0859   UROBILINOGEN 0.2 07/20/2007 0859   NITRITE NEGATIVE 07/20/2007 0859   LEUKOCYTESUR  07/20/2007 0859    NEGATIVE MICROSCOPIC NOT DONE ON URINES WITH  NEGATIVE PROTEIN, BLOOD, LEUKOCYTES, NITRITE, OR GLUCOSE <1000 mg/dL.   Sepsis Labs: !!!!!!!!!!!!!!!!!!!!!!!!!!!!!!!!!!!!!!!!!!!! @LABRCNTIP (procalcitonin:4,lacticidven:4) )No results found for this or any previous visit (from the past 240 hour(s)).   Radiological Exams on Admission: Dg Chest 2 View  Result Date: 08/09/2016 CLINICAL DATA:  Asthma exacerbation EXAM: CHEST  2 VIEW COMPARISON:  06/22/2016 chest radiograph. FINDINGS: Stable cardiomediastinal silhouette with normal heart size. No pneumothorax. No pleural effusion. Lungs appear clear, with no acute consolidative airspace disease and no pulmonary edema. IMPRESSION: No active cardiopulmonary disease. Electronically Signed   By: Ilona Sorrel M.D.   On: 08/09/2016 21:02    EKG: Independently reviewed. Sinus tachycardia.   Assessment/Plan Active Problems:   Asthma exacerbation  Asthma exacerbation  -CXR negative for pneumonia. Currently on room air, without distress, without conversational dyspnea but with bilateral wheezes  -Treat with nebs, solumedrol, claritin   Hypokalemia -Replace   Hypothyroidism -Continue synthroid    DVT prophylaxis: lovenox  Code Status: Full  Family Communication: At bedside Disposition Plan: Return home likely tomorrow if improved  Consults called: None   Severity of Illness: The appropriate patient status for this patient is OBSERVATION. Observation status is judged to be reasonable and necessary in order to provide the required intensity of service to ensure the patient's safety. The patient's presenting symptoms, physical exam findings, and initial radiographic and laboratory data in the context of their medical condition is felt to place them at decreased risk for further clinical deterioration. Furthermore, it is anticipated that the patient will be medically stable for discharge from the hospital within 2 midnights of admission. The following factors support the patient status of  observation.   " The patient's presenting symptoms include shortness of breath, wheezing. " The physical exam findings include wheezing.   Dessa Phi, DO [facility] [email or web address] Password [email or web address] 08/10/2016, 9:09 AM

## 2016-08-10 NOTE — Progress Notes (Signed)
Bp continues to be elevated currently 159/106.  Does not have orders for bp meds here nor does she take any at home per PTA sheet.  Would also like an order for Tylenol for HA and laxative for stool please.  K. Sophia on call and notified and am waiting response.

## 2016-08-10 NOTE — ED Notes (Signed)
Bed: WA17 Expected date:  Expected time:  Means of arrival:  Comments: 

## 2016-08-10 NOTE — ED Notes (Signed)
Report given-transport to 4th floor once hospitalist completes exam

## 2016-08-10 NOTE — ED Notes (Signed)
Ok to transport to floor off the monitor, per [employer]

## 2016-08-10 NOTE — Progress Notes (Signed)
Angela [name] gives orders for Tylenol and Senna but nothing for elevated BP.  Tylenol given and relieves HA and pt able to sleep.  Recheck on BP at this time is 158/101.  Is SR 78 on telemonitor.

## 2016-08-10 NOTE — Progress Notes (Signed)
This is a no charge note  Pending admission per Dr. Tomi Bamberger  61 year old lady with past medical history of asthma, hyperlipidemia, GERD, hypothyroidism, depression, anxiety, seizure, OSA, thygeson's disease, who presents with cough, shortness breath, wheezing, and chest tightness--> most likely due to asthma exacerbation per ED physician. Chest x-ray negative. No leukocytosis and fever. Patient is placed on telemetry bed for observation.  Ivor Costa, MD  [facility] Pager [phone]  If 7PM-7AM, please contact night-coverage [email or web address] Password [facility] 08/10/2016, 5:39 AM

## 2016-08-10 NOTE — ED Provider Notes (Signed)
[facility] DEPT Provider Note   CSN: [id number] Arrival date & time: 08/09/16  2000  By signing my name below, I, Ny'Kea Lewis, attest that this documentation has been prepared under the direction and in the presence of Rolland Porter, MD. Electronically Signed: Lise Auer, ED Scribe. 08/10/16. 2:33 AM.  Time seen 12:55 AM  History   Chief Complaint Chief Complaint  Patient presents with  . Shortness of Breath   The history is provided by the patient. No language interpreter was used.    HPI Comments: Angela [name] is a 61 y.o. female with a history of anxiety, asthma, depression, GERD, HLD, HTN, seizures, and Thygeson's disease, who presents to the Emergency Department complaining of moderate gradually worsening, shortness of breath that began July 9th and has gotten progressively worse earlier today. She notes associated cough with yellow phlegm production, wheezing, sneezing, runny nose with light yellow mucus, chest tightness, and chest soreness from coughing. Pt reports throughout the day she has been increasingly using her inhaler due to difficulty breathing. Normally, she uses her inhaler every six hours, but she is now requiring her inhaler ever three hours. She has been using her inhaler with mild temporary relief, but no full relief of her symptoms. Denies fever, nausea, emesis, and peripheral edema. She states she was last admitted in April and May due to her exacerbation of asthma.  PCP Alroy Dust, L.[name], MD Allergist Dr Ishmael Holter, appt last week   Past Medical History:  Diagnosis Date  . Anxiety   . Asthma   . Breast discharge   . Congestion of right ear   . Depression   . GERD (gastroesophageal reflux disease)   . Headache(784.0)   . Hyperlipidemia   . Hypothyroidism   . Seizures ([place])    hx of during childhood -not since age 50  . Sleep apnea    uses CPAP  . Thygeson's disease   . Thyroid disease   . Tinnitus   . Urticaria     Patient Active Problem List   Diagnosis Date Noted  . Asthma exacerbation 05/13/2016  . Anxiety 05/13/2016  . Depression 05/13/2016  . Erosion of suburethral sling ([place]) 11/01/2014  . Goiter, nodular 09/17/2012  . Hypothyroidism 09/04/2012  . Postop check 02/08/2011  . Nipple discharge 12/18/2010  . Right breast nipple inversion and discharge 11/06/2010    Past Surgical History:  Procedure Laterality Date  . ANTERIOR AND POSTERIOR REPAIR  2007  . BREAST DUCTAL SYSTEM EXCISION  01/18/2011   Procedure: EXCISION DUCTAL SYSTEM BREAST;  Surgeon: Belva Crome, MD;  Location: [facility];  Service: General;  Laterality: Right;  . BREAST SURGERY  01/18/2011   Right BX  . CYSTOSCOPY WITH URETHROLYSIS N/A 11/01/2014   Procedure: CYSTOSCOPY WITH URETHROLYSIS;  Surgeon: Bjorn Loser, MD;  Location: [facility];  Service: Urology;  Laterality: N/A;  . DILATION AND CURETTAGE OF UTERUS    . PUBOVAGINAL SLING N/A 11/01/2014   Procedure: REMOVAL VAGINAL SLING AND URETHROLYSIS AND CYSTO;  Surgeon: Bjorn Loser, MD;  Location: [facility];  Service: Urology;  Laterality: N/A;  . TUBAL LIGATION  1998  . TYMPANOSTOMY TUBE PLACEMENT  2010   right ear   . WISDOM TOOTH EXTRACTION      OB History    Gravida Para Term Preterm AB Living   6 4 4   2 4    SAB TAB Ectopic Multiple Live Births   2  Home Medications    Prior to Admission medications   Medication Sig Start Date End Date Taking? Authorizing Provider  albuterol (PROVENTIL) (2.5 MG/3ML) 0.083% nebulizer solution Take 2.5 mg by nebulization every 6 (six) hours as needed for wheezing or shortness of breath.   Yes [provider]  Albuterol Sulfate (PROAIR RESPICLICK) 626 (90 Base) MCG/ACT AEPB Inhale 2 puffs into the lungs every 4 (four) hours as needed. Patient taking differently: Inhale 2 puffs into the lungs every 6 (six) hours as needed (SOB, wheezing).  03/15/15  Yes Gean Quint, MD  beclomethasone (QVAR) 80 MCG/ACT inhaler  Inhale 2 puffs into the lungs 2 (two) times daily. To prevent cough or wheeze.  Rinse, gargle and spit with water after use. 01/15/16  Yes Valentina Shaggy, MD  fexofenadine (ALLEGRA) 180 MG tablet Take 180 mg by mouth daily.   Yes [provider]  ibuprofen (ADVIL,MOTRIN) 200 MG tablet Take 400 mg by mouth every 6 (six) hours as needed for headache, mild pain or moderate pain.   Yes [provider]  levothyroxine (SYNTHROID, LEVOTHROID) 112 MCG tablet Take 1 tablet (112 mcg total) by mouth daily. 01/26/14  Yes Elayne Snare, MD  Multiple Vitamin (MULTIVITAMIN) tablet Take 1 tablet by mouth daily.   Yes [provider]  triamcinolone (NASACORT AQ) 55 MCG/ACT AERO nasal inhaler Place 2 sprays into the nose as needed (allergies).   Yes [provider]  predniSONE (DELTASONE) 10 MG tablet Takes 6 tablets for 1 days, then 5 tablets for 1 days, then 4 tablets for 1 days, then 3 tablets for 1 days, then 2 tabs for 1 days, then 1 tab for 1 days, and then stop. Patient not taking: Reported on 08/10/2016 06/23/16   Charlynne Cousins, MD    Family History Family History  Problem Relation Age of Onset  . Cancer Father        throat  . Cancer Maternal Aunt        breast  . Allergic rhinitis Neg Hx   . Asthma Neg Hx   . Eczema Neg Hx   . Immunodeficiency Neg Hx   . Urticaria Neg Hx     Social History Social History  Substance Use Topics  . Smoking status: Never Smoker  . Smokeless tobacco: Never Used  . Alcohol use No   Allergies   Aspirin; Ibuprofen; and Other  Review of Systems Review of Systems  Constitutional: Negative for fever.  HENT: Positive for rhinorrhea and sneezing.   Respiratory: Positive for cough, chest tightness and wheezing.   Cardiovascular: Positive for chest pain. Negative for leg swelling.  Gastrointestinal: Negative for nausea and vomiting.  All other systems reviewed and are negative.  Physical Exam Updated Vital Signs BP  (!) 136/97   Pulse 100   Temp 98.1 F (36.7 C) (Oral)   Resp 18   Ht 5\' 5"  (1.651 m)   Wt 107.5 kg (237 lb)   SpO2 94%   BMI 39.44 kg/m   Vital signs normal except tachycardia   Physical Exam  Constitutional: She is oriented to person, place, and time. She appears well-developed and well-nourished.  Non-toxic appearance. She does not appear ill. No distress.  HENT:  Head: Normocephalic and atraumatic.  Right Ear: External ear normal.  Left Ear: External ear normal.  Nose: Nose normal. No mucosal edema or rhinorrhea.  Mouth/Throat: Oropharynx is clear and moist and mucous membranes are normal. No dental abscesses or uvula swelling.  Eyes: Pupils  are equal, round, and reactive to light. Conjunctivae and EOM are normal.  Neck: Normal range of motion and full passive range of motion without pain. Neck supple.  Cardiovascular: Normal rate, regular rhythm and normal heart sounds.  Exam reveals no gallop and no friction rub.   No murmur heard. Pulmonary/Chest: Accessory muscle usage present. Tachypnea noted. She is in respiratory distress. She has decreased breath sounds. She has wheezes. She has no rhonchi. She has no rales. She exhibits no tenderness and no crepitus.  Abdominal: Soft. Normal appearance and bowel sounds are normal. She exhibits no distension. There is no tenderness. There is no rebound and no guarding.  Musculoskeletal: Normal range of motion. She exhibits no edema or tenderness.  Moves all extremities well.   Neurological: She is alert and oriented to person, place, and time. She has normal strength. No cranial nerve deficit.  Skin: Skin is warm, dry and intact. No rash noted. No erythema. No pallor.  Psychiatric: She has a normal mood and affect. Her speech is normal and behavior is normal. Her mood appears not anxious.  Nursing note and vitals reviewed.   ED Treatments / Results  DIAGNOSTIC STUDIES: Oxygen Satration is 95% on RA, adeuate by my interpretation.    Labs (all labs ordered are listed, but only abnormal results are displayed) Results for orders placed or performed during the hospital encounter of 40/10/27  Basic metabolic panel  Result Value Ref Range   Sodium 143 135 - 145 mmol/L   Potassium 3.4 (L) 3.5 - 5.1 mmol/L   Chloride 109 101 - 111 mmol/L   CO2 26 22 - 32 mmol/L   Glucose, Bld 115 (H) 65 - 99 mg/dL   BUN 11 6 - 20 mg/dL   Creatinine, Ser 0.88 0.44 - 1.00 mg/dL   Calcium 9.1 8.9 - 10.3 mg/dL   GFR calc non Af Amer >60 >60 mL/min   GFR calc Af Amer >60 >60 mL/min   Anion gap 8 5 - 15  CBC with Differential  Result Value Ref Range   WBC 8.8 4.0 - 10.5 K/uL   RBC 5.83 (H) 3.87 - 5.11 MIL/uL   Hemoglobin 14.3 12.0 - 15.0 g/dL   HCT 42.0 36.0 - 46.0 %   MCV 72.0 (L) 78.0 - 100.0 fL   MCH 24.5 (L) 26.0 - 34.0 pg   MCHC 34.0 30.0 - 36.0 g/dL   RDW 15.8 (H) 11.5 - 15.5 %   Platelets 273 150 - 400 K/uL   Neutrophils Relative % 59 %   Neutro Abs 5.2 1.7 - 7.7 K/uL   Lymphocytes Relative 29 %   Lymphs Abs 2.6 0.7 - 4.0 K/uL   Monocytes Relative 5 %   Monocytes Absolute 0.5 0.1 - 1.0 K/uL   Eosinophils Relative 6 %   Eosinophils Absolute 0.5 0.0 - 0.7 K/uL   Basophils Relative 1 %   Basophils Absolute 0.0 0.0 - 0.1 K/uL   Laboratory interpretation all normal      EKG  EKG Interpretation None       Radiology Dg Chest 2 View  Result Date: 08/09/2016 CLINICAL DATA:  Asthma exacerbation EXAM: CHEST  2 VIEW COMPARISON:  06/22/2016 chest radiograph. FINDINGS: Stable cardiomediastinal silhouette with normal heart size. No pneumothorax. No pleural effusion. Lungs appear clear, with no acute consolidative airspace disease and no pulmonary edema. IMPRESSION: No active cardiopulmonary disease. Electronically Signed   By: Ilona Sorrel M.D.   On: 08/09/2016 21:02    Procedures Procedures (  including critical care time)  CRITICAL CARE Performed by: Jesseka Drinkard L Jemel Ono Total critical care time: 39 minutes Critical care time  was exclusive of separately billable procedures and treating other patients. Critical care was necessary to treat or prevent imminent or life-threatening deterioration. Critical care was time spent personally by me on the following activities: development of treatment plan with patient and/or surrogate as well as nursing, discussions with consultants, evaluation of patient's response to treatment, examination of patient, obtaining history from patient or surrogate, ordering and performing treatments and interventions, ordering and review of laboratory studies, ordering and review of radiographic studies, pulse oximetry and re-evaluation of patient's condition.   Medications Ordered in ED Medications  albuterol (PROVENTIL) (2.5 MG/3ML) 0.083% nebulizer solution 5 mg (5 mg Nebulization Given 08/09/16 2031)  methylPREDNISolone sodium succinate (SOLU-MEDROL) 125 mg/2 mL injection 125 mg (125 mg Intravenous Given 08/10/16 0136)  albuterol (PROVENTIL,VENTOLIN) solution continuous neb (10 mg/hr Nebulization Given 08/10/16 0125)  ipratropium (ATROVENT) nebulizer solution 0.5 mg (0.5 mg Nebulization Given 08/10/16 0125)  magnesium sulfate IVPB 2 g 50 mL (0 g Intravenous Stopped 08/10/16 0240)  albuterol (PROVENTIL,VENTOLIN) solution continuous neb (10 mg/hr Nebulization Given 08/10/16 0324)  ipratropium (ATROVENT) nebulizer solution 0.5 mg (0.5 mg Nebulization Given 08/10/16 0324)  albuterol (PROVENTIL) (2.5 MG/3ML) 0.083% nebulizer solution 5 mg (5 mg Nebulization Given 08/10/16 0527)  ipratropium (ATROVENT) nebulizer solution 0.5 mg (0.5 mg Nebulization Given 08/10/16 0527)     Initial Impression / Assessment and Plan / ED Course  I have reviewed the triage vital signs and the nursing notes.  Pertinent labs & imaging results that were available during my care of the patient were reviewed by me and considered in my medical decision making (see chart for details).     COORDINATION OF CARE: 1:05 AM-Discussed  next steps with pt. Pt verbalized understanding and is agreeable with the plan. Pt has already had one nebulizer treatment before my exam which she states helped.  Patient was given a continuous nebulizer, After checking her last BUN and creatinine which was normal she was given IV magnesium 2 g. She was also given IV steroids.  3:13 AM - recheck, patient states she's feeling better, Pt has improved air movement. She now has rhonchi and a few scattered wheezing. She is able to talk in complete sentence without audible wheezing. Her continuous nebulizer was repeated.  Recheck 05:21 AM Pt is feeling better, still has bilateral diffuse rhonchi. Discussed need for admission and she is agreeable. She was given a single nebulizer treatment.  05:36 AM Dr Blaine Hamper, hospitalist, will admit  Final Clinical Impressions(s) / ED Diagnoses   Final diagnoses:  Moderate asthma with exacerbation, unspecified whether persistent    Plan admission  Rolland Porter, MD, FACEP   I personally performed the services described in this documentation, which was scribed in my presence. The recorded information has been reviewed and considered.  Rolland Porter, MD, Barbette Or, MD 08/10/16 [phone]

## 2016-08-11 DIAGNOSIS — R030 Elevated blood-pressure reading, without diagnosis of hypertension: Secondary | ICD-10-CM

## 2016-08-11 DIAGNOSIS — K219 Gastro-esophageal reflux disease without esophagitis: Secondary | ICD-10-CM

## 2016-08-11 DIAGNOSIS — J4541 Moderate persistent asthma with (acute) exacerbation: Secondary | ICD-10-CM

## 2016-08-11 LAB — CBC
HCT: 40.4 % (ref 36.0–46.0)
Hemoglobin: 13.6 g/dL (ref 12.0–15.0)
MCH: 24.2 pg — ABNORMAL LOW (ref 26.0–34.0)
MCHC: 33.7 g/dL (ref 30.0–36.0)
MCV: 71.8 fL — ABNORMAL LOW (ref 78.0–100.0)
Platelets: 301 10*3/uL (ref 150–400)
RBC: 5.63 MIL/uL — ABNORMAL HIGH (ref 3.87–5.11)
RDW: 15.8 % — ABNORMAL HIGH (ref 11.5–15.5)
WBC: 16.4 10*3/uL — ABNORMAL HIGH (ref 4.0–10.5)

## 2016-08-11 LAB — BASIC METABOLIC PANEL
Anion gap: 9 (ref 5–15)
BUN: 12 mg/dL (ref 6–20)
CO2: 23 mmol/L (ref 22–32)
Calcium: 9.2 mg/dL (ref 8.9–10.3)
Chloride: 107 mmol/L (ref 101–111)
Creatinine, Ser: 0.9 mg/dL (ref 0.44–1.00)
GFR calc Af Amer: >60 mL/min (ref >60)
GFR calc non Af Amer: >60 mL/min (ref >60)
Glucose, Bld: 137 mg/dL — ABNORMAL HIGH (ref 65–99)
Potassium: 4.6 mmol/L (ref 3.5–5.1)
Sodium: 139 mmol/L (ref 135–145)

## 2016-08-11 MED ORDER — PANTOPRAZOLE SODIUM 40 MG PO TBEC
40.0000 mg | DELAYED_RELEASE_TABLET | Freq: Every day | ORAL | Status: DC
  Administered 2016-08-11: 40 mg via ORAL
  Filled 2016-08-11: qty 1

## 2016-08-11 MED ORDER — PREDNISONE 20 MG PO TABS: ORAL_TABLET | ORAL | 0 refills | Status: DC

## 2016-08-11 MED ORDER — IPRATROPIUM-ALBUTEROL 0.5-2.5 (3) MG/3ML IN SOLN
3.0000 mL | Freq: Two times a day (BID) | RESPIRATORY_TRACT | Status: DC
  Administered 2016-08-11: 3 mL via RESPIRATORY_TRACT

## 2016-08-11 MED ORDER — HYDRALAZINE HCL 20 MG/ML IJ SOLN
10.0000 mg | Freq: Once | INTRAMUSCULAR | Status: AC
  Administered 2016-08-11: 10 mg via INTRAVENOUS
  Filled 2016-08-11: qty 0.5

## 2016-08-11 MED ORDER — PANTOPRAZOLE SODIUM 40 MG PO TBEC: 40.0000 mg | DELAYED_RELEASE_TABLET | Freq: Every day | ORAL | 1 refills | Status: DC

## 2016-08-11 MED ORDER — IBUPROFEN 200 MG PO TABS
200.0000 mg | ORAL_TABLET | Freq: Once | ORAL | Status: AC
  Administered 2016-08-11: 200 mg via ORAL
  Filled 2016-08-11: qty 1

## 2016-08-11 MED ORDER — PREDNISONE 20 MG PO TABS
60.0000 mg | ORAL_TABLET | Freq: Every day | ORAL | Status: DC
  Administered 2016-08-11: 60 mg via ORAL
  Filled 2016-08-11: qty 3

## 2016-08-11 NOTE — Progress Notes (Signed)
Reviewed discharge instructions with pt. Pt verbalizes understanding. Pt spoke with case manager today, regarding medication expenses. Pt has all belonging. Discharged with no symptoms of acute distress. Headache is 2/10 and at pain goal. Pt with no complaints upon discharge

## 2016-08-11 NOTE — Discharge Summary (Signed)
Physician Discharge Summary  Angela [name] YQI:[id number] DOB: [birth year] DOA: 08/10/2016  PCP: Alroy Dust, L.[name], MD  Admit date: 08/10/2016 Discharge date: 08/11/2016  Time spent: 35 minutes  Recommendations for Outpatient Follow-up:  1. Reassessed blood pressure and if needed initiate antihypertensive regimen 2. Patient needs follow-up with her asthma especially's for adjustment of care maintenance asthma therapy. 3. Repeat basic metabolic panel to follow electrolytes and renal function trend.   Discharge Diagnoses:  Active Problems:   Asthma exacerbation   Elevated BP without diagnosis of hypertension   Gastroesophageal reflux disease   Discharge Condition: Stable and improved. Patient denies chest pain, fever, able to speak in full sentences and without complaints of significant breathing trouble. Excellent oxygen saturation on room air. Patient has been advised to follow with her PCP as previously scheduled (08/13/2016) and also to set up follow-up appointment with her asthma specialist.  Diet recommendation: Heart healthy diet  Filed Weights   08/10/16 0343 08/10/16 0800  Weight: 107.5 kg (237 lb) 103.6 kg (228 lb 4.8 oz)    History of present illness:  As per H&P written by Dr.Choi on 08/10/16 61 y.o. female with medical history significant of asthma with frequent exacerbation, hypothyroidism who presents with wheezing and shortness of breath. She states that over the past few months, she has had few episodes of asthma exacerbations. Last week, she was at a cookout and was exposed to smoke. Since then she has had progressively worsening shortness of breath, wheezing, labored breathing. She also has had increased need for her rescue inhalers at home. She denies any fevers or chills, but has had productive cough with yellow sputum, no nausea, vomiting, diarrhea or abdominal pain.  Hospital Course:  1-asthma exacerbation -Chest x-ray negative for pneumonia -Significant  improvement to nebulizer treatment and IV steroids -Patient has been discharged in prednisone tapering continue use of her albuterol (with proper education about her rescue therapy) and continue use of Qvar -Patient will require follow-up with her asthma especially's or pulmonologist as an outpatient in order to have adjustment of her maintenance therapy following this hospital admission. -No need for antibiotics. -Advised to continue over-the-counter Claritin and also to continue the use of her nasal spray.  2-hypothyroidism  -Continue Synthroid   3-GERD -Will discharge on treatment with PPI  4-hypokalemia -Most likely secondary to thick constant use of albuterol prior to admission -Repleted and within normal limits -Magnesium was check and was stable -No abnormalities were seen on telemetry.  5-elevated blood pressure -Most likely associated to the use of Solu Medrol -Patient without prior history of hypertension and was not taking medications for blood pressure either. -Advised to follow a heart healthy diet -Blood pressure to be reassessed by primary care physician and if continues to be elevated will require initiation of antihypertensive therapy.   Procedures:  See below for x-ray reports  Consultations:  None  Discharge Exam: Vitals:   08/11/16 0222 08/11/16 0502  BP: (!) 141/98 (!) 134/99  Pulse: 79 88  Resp: 16 18  Temp:  98.6 F (37 C)    General: Afebrile, no chest pain, reports significant improvement in her breathing. No nausea vomiting. Complaining of mild headache. Patient was able to speak in full sentences. Cardiovascular: S1 and S2, no rubs, no gallops, no JVD appreciated. Respiratory: Improved air movement bilaterally, no crackles, very mild expiratory wheezing at the end of her expiration. Normal respiratory effort and no using accessory muscles. Excellent oxygen saturation on room air. Abdomen: Soft, nontender, nondistended, positive bowel  sounds. Extremities: No edema, no cyanosis or clubbing.   Discharge Instructions   Discharge Instructions    Diet - low sodium heart healthy    Complete by:  As directed    Discharge instructions    Complete by:  As directed    Take medications as prescribed  Arrange follow up with asthma specialist in 10 days Follow up with your PCP as schedule Please maintain yourself well hydrated Follow heart healthy diet     Current Discharge Medication List    START taking these medications   Details  pantoprazole (PROTONIX) 40 MG tablet Take 1 tablet (40 mg total) by mouth daily. Qty: 30 tablet, Refills: 1    predniSONE (DELTASONE) 20 MG tablet Take 3 tablets by mouth daily X 1 day; the 2 tablet by mouth daily X 3 days; then 1 tablet by mouth daily X 3 days; then 1/2 tablet by mouth daily X 3 days and stop prednisone Qty: 12 tablet, Refills: 0      CONTINUE these medications which have NOT CHANGED   Details  albuterol (PROVENTIL) (2.5 MG/3ML) 0.083% nebulizer solution Take 2.5 mg by nebulization every 6 (six) hours as needed for wheezing or shortness of breath.    Albuterol Sulfate (PROAIR RESPICLICK) 785 (90 Base) MCG/ACT AEPB Inhale 2 puffs into the lungs every 4 (four) hours as needed. Qty: 1 each, Refills: 1    beclomethasone (QVAR) 80 MCG/ACT inhaler Inhale 2 puffs into the lungs 2 (two) times daily. To prevent cough or wheeze.  Rinse, gargle and spit with water after use. Qty: 1 Inhaler, Refills: 0    fexofenadine (ALLEGRA) 180 MG tablet Take 180 mg by mouth daily.    ibuprofen (ADVIL,MOTRIN) 200 MG tablet Take 400 mg by mouth every 6 (six) hours as needed for headache, mild pain or moderate pain.    levothyroxine (SYNTHROID, LEVOTHROID) 112 MCG tablet Take 1 tablet (112 mcg total) by mouth daily. Qty: 4 tablet, Refills: 0    Multiple Vitamin (MULTIVITAMIN) tablet Take 1 tablet by mouth daily.    triamcinolone (NASACORT AQ) 55 MCG/ACT AERO nasal inhaler Place 2 sprays  into the nose as needed (allergies).       Allergies  Allergen Reactions  . Aspirin Other (See Comments)    Stomach ulcers   . Ibuprofen Other (See Comments)    Cautious by MD due to kidney function   . Other Other (See Comments)    NO BLOOD PRODUCTS - PT IN AGREEMENT FOR ALBUMIN OR ALBUMIN CONTAINING PRODUCTS PER CONSENT   Follow-up Information    Mitchell, L.[name], MD Follow up today.   Specialty:  Family Medicine Why:  follow up with PCP as previously scheduled Contact information: [address] [place] [address] [phone]           The results of significant diagnostics from this hospitalization (including imaging, microbiology, ancillary and laboratory) are listed below for reference.    Significant Diagnostic Studies: Dg Chest 2 View  Result Date: 08/09/2016 CLINICAL DATA:  Asthma exacerbation EXAM: CHEST  2 VIEW COMPARISON:  06/22/2016 chest radiograph. FINDINGS: Stable cardiomediastinal silhouette with normal heart size. No pneumothorax. No pleural effusion. Lungs appear clear, with no acute consolidative airspace disease and no pulmonary edema. IMPRESSION: No active cardiopulmonary disease. Electronically Signed   By: Ilona Sorrel M.D.   On: 08/09/2016 21:02   Labs: Basic Metabolic Panel:  Recent Labs Lab 08/10/16 0103 08/11/16 0504  NA 143 139  K 3.4* 4.6  CL 109 107  CO2 26 23  GLUCOSE 115* 137*  BUN 11 12  CREATININE 0.88 0.90  CALCIUM 9.1 9.2   CBC:  Recent Labs Lab 08/10/16 0103 08/11/16 0504  WBC 8.8 16.4*  NEUTROABS 5.2  --   HGB 14.3 13.6  HCT 42.0 40.4  MCV 72.0* 71.8*  PLT 273 301     Signed:  Barton Dubois MD.  [facility] 08/11/2016, 3:59 PM

## 2016-08-11 NOTE — Care Management Note (Signed)
Case Management Note  Patient Details  Name: Angela [name] MRN: [id number] Date of Birth: [birth year]  Subjective/Objective:  Pt had questions re: affordability of Inhalers, and other asthma meds as she has PCP and will attend appt next week following hospital stay. Given info re: [place] and assist with medications, Samples, Coupons on GoodRx, and How to call Manufacturers. Pt able and willing to call for assist when needed.                   Action/Plan:CM will sign off for now but will be available should additional discharge needs arise or disposition change.    Expected Discharge Date:                  Expected Discharge Plan:  Home/Self Care  In-House Referral:     Discharge planning Services  CM Consult, Other - See comment (Assist with Medications)  Post Acute Care Choice:  NA Choice offered to:  Patient  DME Arranged:    DME Agency:     HH Arranged:    [place] Agency:     Status of Service:  Completed, signed off  If discussed at [employer] of Stay Meetings, dates discussed:    Additional Comments:  Delrae Sawyers, RN 08/11/2016, 1:17 PM

## 2016-11-21 ENCOUNTER — Encounter (HOSPITAL_COMMUNITY): Payer: Self-pay | Admitting: Emergency Medicine

## 2016-11-21 ENCOUNTER — Ambulatory Visit (HOSPITAL_COMMUNITY)
Admission: EM | Admit: 2016-11-21 | Discharge: 2016-11-21 | Disposition: A | Discharge status: Home / Self Care | Payer: Self-pay | Attending: Family Medicine | Admitting: Family Medicine

## 2016-11-21 DIAGNOSIS — J45901 Unspecified asthma with (acute) exacerbation: Secondary | ICD-10-CM

## 2016-11-21 DIAGNOSIS — R062 Wheezing: Secondary | ICD-10-CM

## 2016-11-21 MED ORDER — PREDNISONE 20 MG PO TABS
60.0000 mg | ORAL_TABLET | Freq: Once | ORAL | Status: AC
  Administered 2016-11-21: 60 mg via ORAL

## 2016-11-21 MED ORDER — PREDNISONE 20 MG PO TABS
ORAL_TABLET | ORAL | Status: AC
  Filled 2016-11-21: qty 3

## 2016-11-21 MED ORDER — BECLOMETHASONE DIPROPIONATE 80 MCG/ACT IN AERS: 2.0000 | INHALATION_SPRAY | Freq: Two times a day (BID) | RESPIRATORY_TRACT | 0 refills | Status: DC

## 2016-11-21 MED ORDER — PREDNISONE 20 MG PO TABS: ORAL_TABLET | ORAL | 0 refills | Status: DC

## 2016-11-21 MED ORDER — IPRATROPIUM-ALBUTEROL 0.5-2.5 (3) MG/3ML IN SOLN
3.0000 mL | Freq: Once | RESPIRATORY_TRACT | Status: AC
  Administered 2016-11-21: 3 mL via RESPIRATORY_TRACT

## 2016-11-21 MED ORDER — IPRATROPIUM-ALBUTEROL 0.5-2.5 (3) MG/3ML IN SOLN
RESPIRATORY_TRACT | Status: AC
  Filled 2016-11-21: qty 3

## 2016-11-21 NOTE — ED Provider Notes (Signed)
[place]    CSN: [id number] Arrival date & time: 11/21/16  1800     History   Chief Complaint Chief Complaint  Patient presents with  . Asthma    HPI Angela [name] is a 61 y.o. female.   Harriett presents with complaints of asthma symptoms which include wheezing, chest tightness and cough which have persisted over the past week. Not worsening but not improving. She has had similar exacerbations in the past. States she was hospitalized multiple times this year and would like to avoid this. She has not been using prescribed qvar due to cost, states it does help. She has been using albuterol inhaler approximately every 3 hours due to wheezing. Poor sleep due to cough. Mild runny nose states feels consistent with her allergies. Without fever or ear pain. She feels similar to other asthma exacerbations. Without gi/gu complaints, denies chest pain.   ROS per HPI.       Past Medical History:  Diagnosis Date  . Anxiety   . Asthma   . Breast discharge   . Congestion of right ear   . Depression   . GERD (gastroesophageal reflux disease)   . Headache(784.0)   . Hyperlipidemia   . Hypothyroidism   . Seizures ([place])    hx of during childhood -not since age 16  . Sleep apnea    uses CPAP  . Thygeson's disease   . Thyroid disease   . Tinnitus   . Urticaria     Patient Active Problem List   Diagnosis Date Noted  . Elevated BP without diagnosis of hypertension   . Gastroesophageal reflux disease   . Asthma exacerbation 05/13/2016  . Anxiety 05/13/2016  . Depression 05/13/2016  . Erosion of suburethral sling ([place]) 11/01/2014  . Goiter, nodular 09/17/2012  . Hypothyroidism 09/04/2012  . Postop check 02/08/2011  . Nipple discharge 12/18/2010  . Right breast nipple inversion and discharge 11/06/2010    Past Surgical History:  Procedure Laterality Date  . ANTERIOR AND POSTERIOR REPAIR  2007  . BREAST DUCTAL SYSTEM EXCISION  01/18/2011   Procedure: EXCISION  DUCTAL SYSTEM BREAST;  Surgeon: Belva Crome, MD;  Location: [facility];  Service: General;  Laterality: Right;  . BREAST SURGERY  01/18/2011   Right BX  . CYSTOSCOPY WITH URETHROLYSIS N/A 11/01/2014   Procedure: CYSTOSCOPY WITH URETHROLYSIS;  Surgeon: Bjorn Loser, MD;  Location: [facility];  Service: Urology;  Laterality: N/A;  . DILATION AND CURETTAGE OF UTERUS    . PUBOVAGINAL SLING N/A 11/01/2014   Procedure: REMOVAL VAGINAL SLING AND URETHROLYSIS AND CYSTO;  Surgeon: Bjorn Loser, MD;  Location: [facility];  Service: Urology;  Laterality: N/A;  . TUBAL LIGATION  1998  . TYMPANOSTOMY TUBE PLACEMENT  2010   right ear   . WISDOM TOOTH EXTRACTION      OB History    Gravida Para Term Preterm AB Living   6 4 4   2 4    SAB TAB Ectopic Multiple Live Births   2               Home Medications    Prior to Admission medications   Medication Sig Start Date End Date Taking? Authorizing Provider  albuterol (PROVENTIL) (2.5 MG/3ML) 0.083% nebulizer solution Take 2.5 mg by nebulization every 6 (six) hours as needed for wheezing or shortness of breath.    [provider]  Albuterol Sulfate (PROAIR RESPICLICK) 921 (90 Base) MCG/ACT AEPB Inhale 2 puffs  into the lungs every 4 (four) hours as needed. Patient taking differently: Inhale 2 puffs into the lungs every 6 (six) hours as needed (SOB, wheezing).  03/15/15   Gean Quint, MD  beclomethasone (QVAR) 80 MCG/ACT inhaler Inhale 2 puffs into the lungs 2 (two) times daily. To prevent cough or wheeze.  Rinse, gargle and spit with water after use. 11/21/16   Zigmund Gottron, NP  fexofenadine (ALLEGRA) 180 MG tablet Take 180 mg by mouth daily.    [provider]  ibuprofen (ADVIL,MOTRIN) 200 MG tablet Take 400 mg by mouth every 6 (six) hours as needed for headache, mild pain or moderate pain.    [provider]  levothyroxine (SYNTHROID, LEVOTHROID) 112 MCG tablet Take 1 tablet (112 mcg total) by mouth  daily. 01/26/14   Elayne Snare, MD  Multiple Vitamin (MULTIVITAMIN) tablet Take 1 tablet by mouth daily.    [provider]  pantoprazole (PROTONIX) 40 MG tablet Take 1 tablet (40 mg total) by mouth daily. 08/11/16   Barton Dubois, MD  predniSONE (DELTASONE) 20 MG tablet Take 3 tablets by mouth daily X 1 day; the 2 tablet by mouth daily X 3 days; then 1 tablet by mouth daily X 3 days; then 1/2 tablet by mouth daily X 3 days and stop prednisone 11/21/16   Augusto Gamble B, NP  triamcinolone (NASACORT AQ) 55 MCG/ACT AERO nasal inhaler Place 2 sprays into the nose as needed (allergies).    [provider]    Family History Family History  Problem Relation Age of Onset  . Cancer Father        throat  . Cancer Maternal Aunt        breast  . Allergic rhinitis Neg Hx   . Asthma Neg Hx   . Eczema Neg Hx   . Immunodeficiency Neg Hx   . Urticaria Neg Hx     Social History Social History  Substance Use Topics  . Smoking status: Never Smoker  . Smokeless tobacco: Never Used  . Alcohol use No     Allergies   Aspirin; Ibuprofen; and Other   Review of Systems Review of Systems   Physical Exam Triage Vital Signs ED Triage Vitals [11/21/16 1818]  Enc Vitals Group     BP (!) 146/102     Pulse Rate 89     Resp 18     Temp 98.5 F (36.9 C)     Temp Source Oral     SpO2 99 %     Weight      Height      Head Circumference      Peak Flow      Pain Score 4     Pain Loc      Pain Edu?      Excl. in [place]?    No data found.   Updated Vital Signs BP (!) 146/102 (BP Location: Right Arm)   Pulse 89   Temp 98.5 F (36.9 C) (Oral)   Resp 18   SpO2 99%   Visual Acuity Right Eye Distance:   Left Eye Distance:   Bilateral Distance:    Right Eye Near:   Left Eye Near:    Bilateral Near:     Physical Exam  Constitutional: She is oriented to person, place, and time. She appears well-developed and well-nourished. No distress.  HENT:  Head: Normocephalic.    Right Ear: External ear normal.  Left Ear: External ear normal.  Nose: Nose  normal.  Mouth/Throat: Oropharynx is clear and moist. No oropharyngeal exudate.  Eyes: Pupils are equal, round, and reactive to light.  Cardiovascular: Normal rate, regular rhythm and normal heart sounds.   Pulmonary/Chest: Effort normal. No respiratory distress. She has wheezes.  Neurological: She is alert and oriented to person, place, and time.  Skin: Skin is warm and dry.  Vitals reviewed.    UC Treatments / Results  Labs (all labs ordered are listed, but only abnormal results are displayed) Labs Reviewed - No data to display  EKG  EKG Interpretation None       Radiology No results found.  Procedures Procedures (including critical care time)  Medications Ordered in UC Medications  ipratropium-albuterol (DUONEB) 0.5-2.5 (3) MG/3ML nebulizer solution 3 mL (3 mLs Nebulization Given 11/21/16 1855)     Initial Impression / Assessment and Plan / UC Course  I have reviewed the triage vital signs and the nursing notes.  Pertinent labs & imaging results that were available during my care of the patient were reviewed by me and considered in my medical decision making (see chart for details).  Clinical Course as of Nov 21 1904  Thu Nov 21, 2016  6010 Duoneb ordered for wheezing currently present. Patient without distress, without tachypnea or hypoxia  [NB]    Clinical Course User Index [NB] Augusto Gamble B, NP    History and physical findings consistent with asthma exacerbation. Mild improvement with wheezing s/p duoneb.  Patient stable, without hypoxia, tachypnea or distress. Duoneb and first dose of prednisone given in clinic tonight as patient unable to make it to pharmacy until tomorrow.  Course of prednisone ordered at this time. Reordered qvar and recommended twice daily use. Continue with daily allergy medication. Recommended follow up with PCP in 1 week for recheck to ensure improvement  and have obtained all medications.   Zigmund Gottron, NP 11/21/2016 7:21 PM   Final Clinical Impressions(s) / UC Diagnoses   Final diagnoses:  Moderate asthma with exacerbation, unspecified whether persistent    New Prescriptions Current Discharge Medication List       Controlled Substance Prescriptions [place] Controlled Substance Registry consulted? Not Applicable   Zigmund Gottron, NP 11/21/16 1921

## 2016-11-21 NOTE — Discharge Instructions (Signed)
We have given you your first dose of prednisone tonight, therefore you only need to take 40mg  for 3 days, continue to follow the written regimen as prescribed until completed. Albuterol inhaler as needed. Try to fill QVar inhaler to use twice a day. Please make an appointment with you PCP in one week for a recheck. If your symptoms worsen please return to be seen.

## 2016-11-21 NOTE — ED Triage Notes (Signed)
Pt here for increased asthma sx that are relieved with inhaler but return after several hours

## 2017-01-23 ENCOUNTER — Ambulatory Visit (HOSPITAL_COMMUNITY)
Admission: EM | Admit: 2017-01-23 | Discharge: 2017-01-23 | Disposition: A | Discharge status: Home / Self Care | Payer: Self-pay | Attending: Internal Medicine | Admitting: Internal Medicine

## 2017-01-23 ENCOUNTER — Encounter (HOSPITAL_COMMUNITY): Payer: Self-pay | Admitting: Family Medicine

## 2017-01-23 DIAGNOSIS — K219 Gastro-esophageal reflux disease without esophagitis: Secondary | ICD-10-CM

## 2017-01-23 DIAGNOSIS — J069 Acute upper respiratory infection, unspecified: Secondary | ICD-10-CM

## 2017-01-23 MED ORDER — KETOROLAC TROMETHAMINE 30 MG/ML IJ SOLN
INTRAMUSCULAR | Status: AC
  Filled 2017-01-23: qty 1

## 2017-01-23 MED ORDER — ONDANSETRON 4 MG PO TBDP
ORAL_TABLET | ORAL | Status: AC
  Filled 2017-01-23: qty 1

## 2017-01-23 MED ORDER — GI COCKTAIL ~~LOC~~
30.0000 mL | Freq: Once | ORAL | Status: AC
  Administered 2017-01-23: 30 mL via ORAL

## 2017-01-23 MED ORDER — GI COCKTAIL ~~LOC~~
ORAL | Status: AC
  Filled 2017-01-23: qty 30

## 2017-01-23 MED ORDER — OMEPRAZOLE 20 MG PO CPDR: 20.0000 mg | DELAYED_RELEASE_CAPSULE | Freq: Every day | ORAL | 0 refills | Status: DC

## 2017-01-23 MED ORDER — ONDANSETRON 4 MG PO TBDP
4.0000 mg | ORAL_TABLET | Freq: Once | ORAL | Status: AC
  Administered 2017-01-23: 4 mg via ORAL

## 2017-01-23 MED ORDER — KETOROLAC TROMETHAMINE 30 MG/ML IJ SOLN
30.0000 mg | Freq: Once | INTRAMUSCULAR | Status: AC
  Administered 2017-01-23: 30 mg via INTRAMUSCULAR

## 2017-01-23 NOTE — ED Provider Notes (Signed)
[place]    CSN: [id number] Arrival date & time: 01/23/17  8916     History   Chief Complaint Chief Complaint  Patient presents with  . Gastroesophageal Reflux  . Headache    HPI Angela [name] is a 61 y.o. female.   Angela [name] presents with complaints of headache, nausea, fatigue which started two days ago along with cough and runny nose. Her grandchildren and daughter were at her home and also had cold symptoms. She also presents with epigastric pain which has been worse with eating which started two days ago. History of GERD, does not take medications for this. Without current epigastric pain. Without vomiting, diarrhea or abdominal pain. Denies sore throat, ear pain or fevers. Last BM was yesterday and patient states she felt more constipated than normal for her. Rates pain 4/10 to her head, which is improved from yesterday. She has not taken any medications for her symptoms today. Headache feels similar to other headaches she has had in the past.    ROS per HPI.       Past Medical History:  Diagnosis Date  . Anxiety   . Asthma   . Breast discharge   . Congestion of right ear   . Depression   . GERD (gastroesophageal reflux disease)   . Headache(784.0)   . Hyperlipidemia   . Hypothyroidism   . Seizures ([place])    hx of during childhood -not since age 84  . Sleep apnea    uses CPAP  . Thygeson's disease   . Thyroid disease   . Tinnitus   . Urticaria     Patient Active Problem List   Diagnosis Date Noted  . Elevated BP without diagnosis of hypertension   . Gastroesophageal reflux disease   . Asthma exacerbation 05/13/2016  . Anxiety 05/13/2016  . Depression 05/13/2016  . Erosion of suburethral sling ([place]) 11/01/2014  . Goiter, nodular 09/17/2012  . Hypothyroidism 09/04/2012  . Postop check 02/08/2011  . Nipple discharge 12/18/2010  . Right breast nipple inversion and discharge 11/06/2010    Past Surgical History:  Procedure Laterality Date   . ANTERIOR AND POSTERIOR REPAIR  2007  . BREAST DUCTAL SYSTEM EXCISION  01/18/2011   Procedure: EXCISION DUCTAL SYSTEM BREAST;  Surgeon: Belva Crome, MD;  Location: [facility];  Service: General;  Laterality: Right;  . BREAST SURGERY  01/18/2011   Right BX  . CYSTOSCOPY WITH URETHROLYSIS N/A 11/01/2014   Procedure: CYSTOSCOPY WITH URETHROLYSIS;  Surgeon: Bjorn Loser, MD;  [facility];  Service: Urology;  Laterality: N/A;  . DILATION AND CURETTAGE OF UTERUS    . PUBOVAGINAL SLING N/A 11/01/2014   Procedure: REMOVAL VAGINAL SLING AND URETHROLYSIS AND CYSTO;  Surgeon: Bjorn Loser, MD;  [facility];  Service: Urology;  Laterality: N/A;  . TUBAL LIGATION  1998  . TYMPANOSTOMY TUBE PLACEMENT  2010   right ear   . WISDOM TOOTH EXTRACTION      OB History    Gravida Para Term Preterm AB Living   6 4 4   2 4    SAB TAB Ectopic Multiple Live Births   2               Home Medications    Prior to Admission medications   Medication Sig Start Date End Date Taking? Authorizing Provider  albuterol (PROVENTIL) (2.5 MG/3ML) 0.083% nebulizer solution Take 2.5 mg by nebulization every 6 (six) hours as needed for wheezing or shortness  of breath.    [provider]  Albuterol Sulfate (PROAIR RESPICLICK) 277 (90 Base) MCG/ACT AEPB Inhale 2 puffs into the lungs every 4 (four) hours as needed. Patient taking differently: Inhale 2 puffs into the lungs every 6 (six) hours as needed (SOB, wheezing).  03/15/15   Gean Quint, MD  beclomethasone (QVAR) 80 MCG/ACT inhaler Inhale 2 puffs into the lungs 2 (two) times daily. To prevent cough or wheeze.  Rinse, gargle and spit with water after use. 11/21/16   Zigmund Gottron, NP  fexofenadine (ALLEGRA) 180 MG tablet Take 180 mg by mouth daily.    [provider]  ibuprofen (ADVIL,MOTRIN) 200 MG tablet Take 400 mg by mouth every 6 (six) hours as needed for headache, mild pain or moderate pain.    [provider]  levothyroxine (SYNTHROID, LEVOTHROID) 112 MCG tablet Take 1 tablet (112 mcg total) by mouth daily. 01/26/14   Elayne Snare, MD  Multiple Vitamin (MULTIVITAMIN) tablet Take 1 tablet by mouth daily.    [provider]  omeprazole (PRILOSEC) 20 MG capsule Take 1 capsule (20 mg total) by mouth daily. 01/23/17   Zigmund Gottron, NP  pantoprazole (PROTONIX) 40 MG tablet Take 1 tablet (40 mg total) by mouth daily. 08/11/16   Barton Dubois, MD  predniSONE (DELTASONE) 20 MG tablet Take 3 tablets by mouth daily X 1 day; the 2 tablet by mouth daily X 3 days; then 1 tablet by mouth daily X 3 days; then 1/2 tablet by mouth daily X 3 days and stop prednisone 11/21/16   Augusto Gamble B, NP  triamcinolone (NASACORT AQ) 55 MCG/ACT AERO nasal inhaler Place 2 sprays into the nose as needed (allergies).    [provider]    Family History Family History  Problem Relation Age of Onset  . Cancer Father        throat  . Cancer Maternal Aunt        breast  . Allergic rhinitis Neg Hx   . Asthma Neg Hx   . Eczema Neg Hx   . Immunodeficiency Neg Hx   . Urticaria Neg Hx     Social History Social History   Tobacco Use  . Smoking status: Never Smoker  . Smokeless tobacco: Never Used  Substance Use Topics  . Alcohol use: No  . Drug use: No     Allergies   Aspirin; Ibuprofen; and Other   Review of Systems Review of Systems   Physical Exam Triage Vital Signs ED Triage Vitals  Enc Vitals Group     BP 01/23/17 1029 (!) 142/98     Pulse Rate 01/23/17 1029 78     Resp 01/23/17 1029 18     Temp 01/23/17 1029 98.1 F (36.7 C)     Temp src --      SpO2 01/23/17 1029 100 %     Weight --      Height --      Head Circumference --      Peak Flow --      Pain Score 01/23/17 1028 5     Pain Loc --      Pain Edu? --      Excl. in [place]? --    No data found.  Updated Vital Signs BP (!) 142/98   Pulse 78   Temp 98.1 F (36.7 C)   Resp 18   SpO2 100%   Visual  Acuity Right Eye Distance:   Left Eye Distance:  Bilateral Distance:    Right Eye Near:   Left Eye Near:    Bilateral Near:     Physical Exam  Constitutional: She is oriented to person, place, and time. She appears well-developed and well-nourished. No distress.  HENT:  Head: Normocephalic and atraumatic.  Right Ear: Tympanic membrane, external ear and ear canal normal.  Left Ear: Tympanic membrane, external ear and ear canal normal.  Nose: Nose normal.  Mouth/Throat: Uvula is midline, oropharynx is clear and moist and mucous membranes are normal. No tonsillar exudate.  Tube to right TM  Eyes: Conjunctivae and EOM are normal. Pupils are equal, round, and reactive to light.  Cardiovascular: Normal rate, regular rhythm and normal heart sounds.  Pulmonary/Chest: Effort normal and breath sounds normal.  Abdominal: There is tenderness in the epigastric area.  Neurological: She is alert and oriented to person, place, and time.  Skin: Skin is warm and dry.     UC Treatments / Results  Labs (all labs ordered are listed, but only abnormal results are displayed) Labs Reviewed - No data to display  EKG  EKG Interpretation None       Radiology No results found.  Procedures Procedures (including critical care time)  Medications Ordered in UC Medications  ondansetron (ZOFRAN-ODT) disintegrating tablet 4 mg (not administered)  ketorolac (TORADOL) 30 MG/ML injection 30 mg (not administered)  gi cocktail (Maalox,Lidocaine,Donnatal) (not administered)     Initial Impression / Assessment and Plan / UC Course  I have reviewed the triage vital signs and the nursing notes.  Pertinent labs & imaging results that were available during my care of the patient were reviewed by me and considered in my medical decision making (see chart for details).     Symptoms consistent with GERD at this time, epigastric pain reproducible with palpation, worsens with eating. Epigastric symptoms  improved with gi cocktail in clinic. Mild constipation may be further exacerbating this. Viral illness likely contributing to fatigue and cold symptoms. Supportive cares recommended. Return precautions provided. Patient verbalized understanding and agreeable to plan.  If symptoms worsen or do not improve in the next week to return to be seen or to follow up with PCP.     Final Clinical Impressions(s) / UC Diagnoses   Final diagnoses:  Gastroesophageal reflux disease, esophagitis presence not specified  Viral upper respiratory tract infection    ED Discharge Orders        Ordered    omeprazole (PRILOSEC) 20 MG capsule  Daily     01/23/17 1046       Controlled Substance Prescriptions [place] Controlled Substance Registry consulted? Not Applicable   Zigmund Gottron, NP 01/23/17 1108    Augusto Gamble B, NP 01/23/17 1116

## 2017-01-23 NOTE — Discharge Instructions (Signed)
Please take Omeprazole daily  to improve GERD symptoms. See attached education on GERD to help improve symptoms. Continue to push fluids to ensure adequate hydration to help with headache. Do not take additional ibuprofen for another 6 hours after the shot we have given you today. You may take Miralax as needed for constipation, this may be found over the counter.  Rest. May use over the counter cough or cough treatments as needed for symptoms. If symptoms worsen or do not improve in the next week to return to be seen or to follow up with PCP.

## 2017-01-23 NOTE — ED Triage Notes (Signed)
Pt here for burning sensation, cough, headache since Tuesday.

## 2017-06-06 ENCOUNTER — Other Ambulatory Visit: Payer: Self-pay | Admitting: Obstetrics and Gynecology

## 2017-06-06 DIAGNOSIS — Z1231 Encounter for screening mammogram for malignant neoplasm of breast: Secondary | ICD-10-CM

## 2017-06-20 ENCOUNTER — Ambulatory Visit: Payer: Self-pay

## 2017-07-04 ENCOUNTER — Ambulatory Visit
Admission: RE | Admit: 2017-07-04 | Discharge: 2017-07-04 | Disposition: A | Discharge status: Home / Self Care | Payer: Managed Care, Other (non HMO) | Source: Ambulatory Visit | Attending: Obstetrics and Gynecology | Admitting: Obstetrics and Gynecology

## 2017-07-04 DIAGNOSIS — Z1231 Encounter for screening mammogram for malignant neoplasm of breast: Secondary | ICD-10-CM

## 2017-07-07 ENCOUNTER — Other Ambulatory Visit: Payer: Self-pay | Admitting: Obstetrics and Gynecology

## 2017-07-07 DIAGNOSIS — R928 Other abnormal and inconclusive findings on diagnostic imaging of breast: Secondary | ICD-10-CM

## 2017-07-10 ENCOUNTER — Ambulatory Visit
Admission: RE | Admit: 2017-07-10 | Discharge: 2017-07-10 | Disposition: A | Discharge status: Home / Self Care | Payer: Managed Care, Other (non HMO) | Source: Ambulatory Visit | Attending: Obstetrics and Gynecology | Admitting: Obstetrics and Gynecology

## 2017-07-10 ENCOUNTER — Other Ambulatory Visit: Payer: Self-pay | Admitting: Obstetrics and Gynecology

## 2017-07-10 DIAGNOSIS — R928 Other abnormal and inconclusive findings on diagnostic imaging of breast: Secondary | ICD-10-CM

## 2017-07-10 DIAGNOSIS — R921 Mammographic calcification found on diagnostic imaging of breast: Secondary | ICD-10-CM

## 2017-07-14 ENCOUNTER — Other Ambulatory Visit: Payer: Self-pay | Admitting: Obstetrics and Gynecology

## 2017-07-14 ENCOUNTER — Ambulatory Visit
Admission: RE | Admit: 2017-07-14 | Discharge: 2017-07-14 | Disposition: A | Discharge status: Home / Self Care | Payer: Managed Care, Other (non HMO) | Source: Ambulatory Visit | Attending: Obstetrics and Gynecology | Admitting: Obstetrics and Gynecology

## 2017-07-14 DIAGNOSIS — R921 Mammographic calcification found on diagnostic imaging of breast: Secondary | ICD-10-CM

## 2017-07-17 ENCOUNTER — Encounter: Payer: Self-pay | Admitting: *Deleted

## 2017-07-21 ENCOUNTER — Telehealth: Payer: Self-pay | Admitting: Hematology and Oncology

## 2017-07-21 NOTE — Telephone Encounter (Signed)
Spoke with patient to confirm morning [facility] appointment for 6/26, packet mailed and emailed to patient

## 2017-07-22 ENCOUNTER — Other Ambulatory Visit: Payer: Self-pay | Admitting: *Deleted

## 2017-07-22 DIAGNOSIS — C50312 Malignant neoplasm of lower-inner quadrant of left female breast: Secondary | ICD-10-CM | POA: Insufficient documentation

## 2017-07-22 DIAGNOSIS — Z170 Estrogen receptor positive status [ER+]: Principal | ICD-10-CM

## 2017-07-23 ENCOUNTER — Ambulatory Visit
Admission: RE | Admit: 2017-07-23 | Discharge: 2017-07-23 | Disposition: A | Discharge status: Home / Self Care | Payer: Managed Care, Other (non HMO) | Source: Ambulatory Visit | Attending: Radiation Oncology | Admitting: Radiation Oncology

## 2017-07-23 ENCOUNTER — Inpatient Hospital Stay: Payer: Managed Care, Other (non HMO) | Attending: Hematology and Oncology | Admitting: Hematology and Oncology

## 2017-07-23 ENCOUNTER — Other Ambulatory Visit: Payer: Self-pay

## 2017-07-23 ENCOUNTER — Other Ambulatory Visit: Payer: Self-pay | Admitting: *Deleted

## 2017-07-23 ENCOUNTER — Encounter: Payer: Self-pay | Admitting: Physical Therapy

## 2017-07-23 ENCOUNTER — Ambulatory Visit: Payer: Managed Care, Other (non HMO) | Attending: General Surgery | Admitting: Physical Therapy

## 2017-07-23 ENCOUNTER — Inpatient Hospital Stay: Payer: Managed Care, Other (non HMO)

## 2017-07-23 DIAGNOSIS — Z170 Estrogen receptor positive status [ER+]: Secondary | ICD-10-CM

## 2017-07-23 DIAGNOSIS — R293 Abnormal posture: Secondary | ICD-10-CM

## 2017-07-23 DIAGNOSIS — C50312 Malignant neoplasm of lower-inner quadrant of left female breast: Secondary | ICD-10-CM

## 2017-07-23 LAB — CBC WITH DIFFERENTIAL (CANCER CENTER ONLY)
Basophils Absolute: 0 10*3/uL (ref 0.0–0.1)
Basophils Relative: 1 %
Eosinophils Absolute: 0.2 10*3/uL (ref 0.0–0.5)
Eosinophils Relative: 2 %
HCT: 41.2 % (ref 34.8–46.6)
Hemoglobin: 13.5 g/dL (ref 11.6–15.9)
Lymphocytes Relative: 24 %
Lymphs Abs: 1.5 10*3/uL (ref 0.9–3.3)
MCH: 24.5 pg — ABNORMAL LOW (ref 25.1–34.0)
MCHC: 32.8 g/dL (ref 31.5–36.0)
MCV: 74.7 fL — ABNORMAL LOW (ref 79.5–101.0)
Monocytes Absolute: 0.4 10*3/uL (ref 0.1–0.9)
Monocytes Relative: 6 %
Neutro Abs: 4.3 10*3/uL (ref 1.5–6.5)
Neutrophils Relative %: 67 %
Platelet Count: 251 10*3/uL (ref 145–400)
RBC: 5.52 MIL/uL — ABNORMAL HIGH (ref 3.70–5.45)
RDW: 15.7 % — ABNORMAL HIGH (ref 11.2–14.5)
WBC Count: 6.4 10*3/uL (ref 3.9–10.3)

## 2017-07-23 LAB — CMP (CANCER CENTER ONLY)
ALT: 12 U/L (ref 0–44)
AST: 11 U/L — ABNORMAL LOW (ref 15–41)
Albumin: 4 g/dL (ref 3.5–5.0)
Alkaline Phosphatase: 79 U/L (ref 38–126)
Anion gap: 11 (ref 5–15)
BUN: 14 mg/dL (ref 6–20)
CO2: 24 mmol/L (ref 22–32)
Calcium: 9.6 mg/dL (ref 8.9–10.3)
Chloride: 109 mmol/L (ref 98–111)
Creatinine: 0.94 mg/dL (ref 0.44–1.00)
GFR, Est AFR Am: >60 mL/min (ref >60)
GFR, Estimated: >60 mL/min (ref >60)
Glucose, Bld: 95 mg/dL (ref 70–99)
Potassium: 4 mmol/L (ref 3.5–5.1)
Sodium: 144 mmol/L (ref 135–145)
Total Bilirubin: 0.2 mg/dL — ABNORMAL LOW (ref 0.3–1.2)
Total Protein: 7.7 g/dL (ref 6.5–8.1)

## 2017-07-23 NOTE — Therapy (Addendum)
[place], [place], [address] Phone: [phone]   Fax:  [phone]  Physical Therapy Evaluation  Patient Details  Name: Angela [name] MRN: [id number] Date of Birth: 1955/02/25 Referring Provider: Dr. Rolm Bookbinder   Encounter Date: 07/23/2017  PT End of Session - 07/23/17 1114    Visit Number  1    Number of Visits  2    Date for PT Re-Evaluation  09/17/17    PT Start Time  0925    PT Stop Time  0950    PT Time Calculation (min)  25 min    Activity Tolerance  Patient tolerated treatment well    Behavior During Therapy  [facility] for tasks assessed/performed       Past Medical History:  Diagnosis Date  . Anxiety   . Asthma   . Breast discharge   . Congestion of right ear   . Depression   . GERD (gastroesophageal reflux disease)   . Headache(784.0)   . Hyperlipidemia   . Hypothyroidism   . Seizures ([place])    hx of during childhood -not since age 89  . Sleep apnea    uses CPAP  . Thygeson's disease   . Thyroid disease   . Tinnitus   . Urticaria     Past Surgical History:  Procedure Laterality Date  . ANTERIOR AND POSTERIOR REPAIR  2007  . BREAST DUCTAL SYSTEM EXCISION  01/18/2011   Procedure: EXCISION DUCTAL SYSTEM BREAST;  Surgeon: Belva Crome, MD;  Location: [facility];  Service: General;  Laterality: Right;  . BREAST EXCISIONAL BIOPSY Right   . BREAST SURGERY  01/18/2011   Right BX  . CYSTOSCOPY WITH URETHROLYSIS N/A 11/01/2014   Procedure: CYSTOSCOPY WITH URETHROLYSIS;  Surgeon: Bjorn Loser, MD;  Location: [facility];  Service: Urology;  Laterality: N/A;  . DILATION AND CURETTAGE OF UTERUS    . PUBOVAGINAL SLING N/A 11/01/2014   Procedure: REMOVAL VAGINAL SLING AND URETHROLYSIS AND CYSTO;  Surgeon: Bjorn Loser, MD;  Location: [facility];  Service: Urology;  Laterality: N/A;  . TUBAL LIGATION  1998  . TYMPANOSTOMY TUBE PLACEMENT  2010   right ear   . WISDOM  TOOTH EXTRACTION      There were no vitals filed for this visit.   Subjective Assessment - 07/23/17 1050    Subjective  Patient reports she is here today to be seen by her medical team for her newly diagnosed left breast cancer.    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 It is an area of distortion that is ER/PR positive and HER2 negative with a Ki67 of 2%.    Patient Stated Goals  Reduce lymphedema risk and learn post op shoulder ROM HEP    Currently in Pain?  Yes    Pain Score  -- varies    Pain Location  -- pelvic floor from bladder surgery    Pain Orientation  Lower    Pain Descriptors / Indicators  Shooting    Pain Type  Chronic pain    Pain Onset  More than a month ago    Aggravating Factors   unknown    Pain Relieving Factors  unknown         [facility] PT Assessment - 07/23/17 0001      Assessment   Medical Diagnosis  Left breast cancer    Referring Provider  Dr. Rodman Key  [place]    Onset Date/Surgical Date  07/04/17    Hand Dominance  Right    Prior Therapy  none      Precautions   Precautions  Other (comment)    Precaution Comments  active cancer      Restrictions   Weight Bearing Restrictions  No      Balance Screen   Has the patient fallen in the past 6 months  No    Has the patient had a decrease in activity level because of a fear of falling?   No    Is the patient reluctant to leave their home because of a fear of falling?   No      Home Environment   Living Environment  Private residence    Living Arrangements  Alone    Available Help at Discharge  Family      Prior Function   Level of Independence  Independent    Vocation  Full time employment    Vocation Requirements  Works at call center for [employer]  She walks 3-4x/week for 45 minutes      Cognition   Overall Cognitive Status  Within Functional Limits for tasks assessed      Posture/Postural Control    Posture/Postural Control  Postural limitations    Postural Limitations  Rounded Shoulders;Forward head      ROM / Strength   AROM / PROM / Strength  AROM;Strength      AROM   AROM Assessment Site  Shoulder;Cervical    Right/Left Shoulder  Right;Left    Right Shoulder Extension  50 Degrees    Right Shoulder Flexion  149 Degrees    Right Shoulder ABduction  159 Degrees    Right Shoulder Internal Rotation  62 Degrees    Right Shoulder External Rotation  90 Degrees    Left Shoulder Extension  35 Degrees    Left Shoulder Flexion  146 Degrees    Left Shoulder ABduction  163 Degrees    Left Shoulder Internal Rotation  64 Degrees    Left Shoulder External Rotation  88 Degrees    Cervical Flexion  WNL    Cervical Extension  WNL    Cervical - Right Side Bend  WNL    Cervical - Left Side Bend  WNL    Cervical - Right Rotation  WNL    Cervical - Left Rotation  WNL      Strength   Overall Strength  Within functional limits for tasks performed        LYMPHEDEMA/ONCOLOGY QUESTIONNAIRE - 07/23/17 1110      Type   Cancer Type  Left breast cancer      Lymphedema Assessments   Lymphedema Assessments  Upper extremities      Right Upper Extremity Lymphedema   10 cm Proximal to Olecranon Process  34.8 cm    Olecranon Process  27.5 cm    10 cm Proximal to Ulnar Styloid Process  26.4 cm    Just Proximal to Ulnar Styloid Process  17.7 cm    Across Hand at [employer]  21 cm    At [place] of 2nd Digit  7.1 cm      Left Upper Extremity Lymphedema   10 cm Proximal to Olecranon Process  34.3 cm    Olecranon Process  27 cm    10 cm Proximal to Ulnar Styloid Process  25.7 cm    Just Proximal to Ulnar Styloid Process  17.6 cm    Across Hand at [employer]  20.8 cm    At [place] of 2nd Digit  6.9 cm             Objective measurements completed on examination: See above findings.     Patient was instructed today in a home exercise program today for post op shoulder range of motion.  These included active assist shoulder flexion in sitting, scapular retraction, wall walking with shoulder abduction, and hands behind head external rotation.  She was encouraged to do these twice a day, holding 3 seconds and repeating 5 times when permitted by her physician.       PT Education - 07/23/17 1114    Education Details  Lymphedema risk reduction and post op shoulder ROM HEP    Person(s) Educated  Patient    Methods  Explanation;Demonstration;Handout    Comprehension  Returned demonstration;Verbalized understanding          PT Long Term Goals - 07/23/17 1119      PT LONG TERM GOAL #1   Title  Patient will demonstrate she has returned to baseline related to shoulder function and ROM.    Time  8    Period  Weeks    Status  New      Breast Clinic Goals - 07/23/17 1119      Patient will be able to verbalize understanding of pertinent lymphedema risk reduction practices relevant to her diagnosis specifically related to skin care.   Time  1    Period  Days    Status  Achieved      Patient will be able to return demonstrate and/or verbalize understanding of the post-op home exercise program related to regaining shoulder range of motion.   Time  1    Period  Days    Status  Achieved      Patient will be able to verbalize understanding of the importance of attending the postoperative After Breast Cancer Class for further lymphedema risk reduction education and therapeutic exercise.   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 It is an area of distortion that is ER/PR positive and HER2 negative with a Ki67 of 2%. Her multidisciplinary medical team met prior to her assessments to determine a recommended treatment plan. She is planning to have a left lumpectomy and sentinel node biopsy followed by radiation  and anti-estrogen therapy. She will benefit from a post op PT visit to reassess and determine needs.    History and Personal Factors relevant to plan of care:  Lives alone    Clinical Presentation  Stable    Clinical Decision Making  Low    Rehab Potential  Excellent    Clinical Impairments Affecting Rehab Potential  None    PT Frequency  -- eval and 1 f/u visit    PT Treatment/Interventions  ADLs/Self Care Home Management;Therapeutic exercise;Patient/family education    PT Next Visit Plan  She will be reassessed 3-4 weeks post op    PT Home Exercise Plan  Post op shoulder ROM HEP    Consulted and Agree with Plan of Care  Patient       Patient will benefit from skilled therapeutic intervention in order to improve the  following deficits and impairments:  Decreased knowledge of precautions, Impaired UE functional use, Decreased range of motion, Postural dysfunction, Pain  Visit Diagnosis: Malignant neoplasm of lower-inner quadrant of left breast in female, estrogen receptor positive ([place]) - Plan: PT plan of care cert/re-cert  Abnormal posture - Plan: PT plan of care cert/re-cert   Patient will follow up at outpatient cancer rehab 3-4 weeks following surgery.  If the patient requires physical therapy at that time, a specific plan will be dictated and sent to the referring physician for approval. The patient was educated today on appropriate basic range of motion exercises to begin post operatively and the importance of attending the After Breast Cancer class following surgery.  Patient was educated today on lymphedema risk reduction practices as it pertains to recommendations that will benefit the patient immediately following surgery.  She verbalized good understanding.     Problem List Patient Active Problem List   Diagnosis Date Noted  . Malignant neoplasm of lower-inner quadrant of left breast in female, estrogen receptor positive ([place]) 07/22/2017  . Elevated BP without diagnosis of  hypertension   . Gastroesophageal reflux disease   . Asthma exacerbation 05/13/2016  . Anxiety 05/13/2016  . Depression 05/13/2016  . Erosion of suburethral sling ([place]) 11/01/2014  . Goiter, nodular 09/17/2012  . Hypothyroidism 09/04/2012  . Postop check 02/08/2011  . Nipple discharge 12/18/2010  . Right breast nipple inversion and discharge 11/06/2010   Annia Friendly, PT 07/23/17 11:23 AM  [place], [place], [address] Phone: [phone]   Fax:  [phone]  Name: Angela [name] MRN: [id number] Date of Birth: 1955-04-05  PHYSICAL THERAPY DISCHARGE SUMMARY  Visits from Start of Care: 1  Current functional level related to goals / functional outcomes: Patient did not return after eval   Remaining deficits: Unknown  Education / Equipment: HEP Plan: Patient agrees to discharge.  Patient goals were not met. Patient is being discharged due to not returning since the last visit.  ?????         Annia Friendly, [place] 02/01/18 9:04 AM

## 2017-07-23 NOTE — Patient Instructions (Signed)

## 2017-07-23 NOTE — Progress Notes (Signed)
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   HISTORY OF PRESENT ILLNESS::Angela [name] is a 62 y.o. female who is seen today in our multidisciplinary breast clinic. She presented for screening mammogram on 07/04/2017 which showed possible asymmetry with associated microcalcifications in the left breast. Diagnostic mammogram and ultrasound were performed. These scans showed developing asymmetry within the posterior left breast, slight outer. No suspicious solid mass or cystic mass is identified. Biopsy of left breast lower central far posterior depth reveals invasive ductal carcinoma and DCIS, grade I-II,   Family history includes a maternal aunt with breast cancer.  The patient is here for further evaluation and discussion of radiation treatment options in the management of her disease.  PREVIOUS RADIATION THERAPY: No  PAST MEDICAL HISTORY:  has a past medical history of Anxiety, Asthma, Breast discharge, Congestion of right ear, Depression, GERD (gastroesophageal reflux disease), Headache(784.0), Hyperlipidemia, Hypothyroidism, Seizures ([place]), Sleep apnea, Thygeson's disease, Thyroid disease, Tinnitus, and Urticaria.    PAST SURGICAL HISTORY: Past Surgical History:  Procedure Laterality Date  . ANTERIOR AND POSTERIOR REPAIR  2007  . BREAST DUCTAL SYSTEM EXCISION  01/18/2011   Procedure: EXCISION DUCTAL SYSTEM BREAST;  Surgeon: Belva Crome, MD;  Location: [facility];  Service: General;  Laterality: Right;  . BREAST EXCISIONAL BIOPSY Right   . BREAST SURGERY  01/18/2011   Right BX  . CYSTOSCOPY WITH  URETHROLYSIS N/A 11/01/2014   Procedure: CYSTOSCOPY WITH URETHROLYSIS;  Surgeon: Bjorn Loser, MD;  Location: [facility];  Service: Urology;  Laterality: N/A;  . DILATION AND CURETTAGE OF UTERUS    . PUBOVAGINAL SLING N/A 11/01/2014   Procedure: REMOVAL VAGINAL SLING AND URETHROLYSIS AND CYSTO;  Surgeon: Bjorn Loser, MD;  Location: [facility];  Service: Urology;  Laterality: N/A;  . TUBAL LIGATION  1998  . TYMPANOSTOMY TUBE PLACEMENT  2010   right ear   . WISDOM TOOTH EXTRACTION      FAMILY HISTORY: family history includes Breast cancer in her maternal aunt; Cancer in her father and maternal aunt.  SOCIAL HISTORY:  reports that she has never smoked. She has never used smokeless tobacco. She reports that she does not drink alcohol or use drugs.  ALLERGIES: Aspirin; Ibuprofen; and Other  MEDICATIONS:  Current Outpatient Medications  Medication Sig Dispense Refill  . albuterol (PROVENTIL) (2.5 MG/3ML) 0.083% nebulizer solution Take 2.5 mg by nebulization every 6 (six) hours as needed for wheezing or shortness of breath.    . Albuterol Sulfate (PROAIR RESPICLICK) 419 (90 Base) MCG/ACT AEPB Inhale 2 puffs into the lungs every 4 (four) hours as needed. (Patient taking differently: Inhale 2 puffs into the lungs every 6 (six) hours as needed (SOB, wheezing). ) 1 each 1  . beclomethasone (QVAR) 80 MCG/ACT inhaler Inhale 2 puffs into the lungs 2 (two) times daily. To prevent cough or wheeze.  Rinse, gargle and spit with water after use. 1 Inhaler 0  . fexofenadine (ALLEGRA) 180 MG tablet Take 180 mg by mouth daily.    Marland Kitchen ibuprofen (ADVIL,MOTRIN) 200 MG tablet Take 400 mg by mouth every 6 (six) hours as needed for headache, mild pain or moderate pain.    Marland Kitchen  levothyroxine (SYNTHROID, LEVOTHROID) 112 MCG tablet Take 1 tablet (112 mcg total) by mouth daily. 4 tablet 0  . Multiple Vitamin (MULTIVITAMIN) tablet Take 1 tablet by mouth daily.    Marland Kitchen omeprazole (PRILOSEC) 20 MG capsule Take 1 capsule (20 mg  total) by mouth daily. 30 capsule 0  . pantoprazole (PROTONIX) 40 MG tablet Take 1 tablet (40 mg total) by mouth daily. 30 tablet 1  . predniSONE (DELTASONE) 20 MG tablet Take 3 tablets by mouth daily X 1 day; the 2 tablet by mouth daily X 3 days; then 1 tablet by mouth daily X 3 days; then 1/2 tablet by mouth daily X 3 days and stop prednisone 12 tablet 0  . triamcinolone (NASACORT AQ) 55 MCG/ACT AERO nasal inhaler Place 2 sprays into the nose as needed (allergies).     No current facility-administered medications for this encounter.    Gynecologic History  Age at first menstrual period? 14  Are you still having periods? No Approximate date of last period? 2012  If you are still having periods: Are your periods regular? N/A  If you no longer have periods: Have you used hormone replacement? No  If YES, for how long? N/A When did you stop? N/A Obstetric History:  How many children have you carried to term? 4 Your age at first live birth? 77  Pregnant now or trying to get pregnant? No  Have you used birth control pills or hormone shots for contraception? Yes  If so, for how long (or approximate dates)? Short term, nearly 25 years ago  Would you be interested in learning more about the options to preserve fertility? N/A Health Maintenance:  Have you ever had a colonoscopy? No If yes, date? N/A  Have you ever had a bone density? No If yes, date? N/A  Date of your last PAP smear? 2018 Date of your FIRST mammogram? Approximately 10-15 years ago  REVIEW OF SYSTEMS:  On review of systems, the patient reports that she is doing well overall. She reports lower abdominal pain described as throbbing. She wears glasses and contacts. She reports ringing in ears and runny nose. She reports mild urinary incontinence. She reports left breast pain which started recently. She reports stiffness if sitting too long. She reports headaches. She reports anxiety and depression in the past but not taking medication  currently. She reports thyroid problem.   10+ POINT REVIEW OF SYSTEMS WAS OBTAINED including neurology, dermatology, psychiatry, cardiac, respiratory, lymph, extremities, GI, GU, musculoskeletal, constitutional, reproductive, HEENT. All pertinent positives are noted in the HPI. All others are negative.   PHYSICAL EXAM:  Vitals with BMI 07/23/2017  Height 5' 5"  Weight 228 lbs 5 oz  BMI 30.16  Systolic 010  Diastolic 89  Pulse 69  Respirations 20   Lungs are clear to auscultation bilaterally. Heart has regular rate and rhythm. No palpable cervical, supraclavicular, or axillary adenopathy. Abdomen soft, non-tender, normal bowel sounds. Right breast no palpable mass or nipple discharge. Left breast the patient has a biopsy site in the superior aspect of the breast in approximately the 12 o'clock position. No palpable mass, nipple discharge or bleeding.  ECOG = 1   LABORATORY DATA:  Lab Results  Component Value Date   WBC 6.4 07/23/2017   HGB 13.5 07/23/2017   HCT 41.2 07/23/2017   MCV 74.7 (L) 07/23/2017   PLT 251 07/23/2017   NEUTROABS 4.3 07/23/2017   Lab Results  Component Value Date   NA 139 08/11/2016  K 4.6 08/11/2016   CL 107 08/11/2016   CO2 23 08/11/2016   GLUCOSE 137 (H) 08/11/2016   CREATININE 0.90 08/11/2016   CALCIUM 9.2 08/11/2016      RADIOGRAPHY: US Breast Ltd Uni Left Inc Axilla  Result Date: 07/10/2017 CLINICAL DATA:  Patient returns today to evaluate a possible LEFT breast asymmetry with calcifications questioned on recent screening mammogram. EXAM: DIGITAL DIAGNOSTIC LEFT MAMMOGRAM WITH CAD AND TOMO ULTRASOUND LEFT BREAST COMPARISON:  Previous exams including recent screening mammogram dated 07/04/2017. ACR Breast Density Category c: The breast tissue is heterogeneously dense, which may obscure small masses. FINDINGS: There is a developing asymmetry within the posterior LEFT breast, slightly outer on the CC projection, best seen on spot compression CC slice  59, inferior LEFT breast on the MLO slice 57. Several loosely grouped punctate calcifications are present within the asymmetry. Mammographic images were processed with CAD. Targeted ultrasound is performed, evaluating the entire LEFT breast with particular attention to the lower outer quadrant, showing only normal fibroglandular tissues and fat lobules throughout. No suspicious solid or cystic mass is identified. IMPRESSION: Developing asymmetry within the posterior LEFT breast, slightly outer, best seen on CC views, without sonographic correlate. This is a suspicious finding for which stereotactic biopsy is recommended. RECOMMENDATION: Stereotactic biopsy, with 3D tomosynthesis guidance, for the developing asymmetry within the posterior LEFT breast. Stereotactic biopsy, with 3D tomosynthesis guidance, is scheduled for June 17th. I have discussed the findings and recommendations with the patient. Results were also provided in writing at the conclusion of the visit. If applicable, a reminder letter will be sent to the patient regarding the next appointment. BI-RADS CATEGORY  4: Suspicious. Electronically Signed   By: Franki Cabot M.D.   On: 07/10/2017 15:38   Mm Diag Breast Tomo Uni Left  Result Date: 07/10/2017 CLINICAL DATA:  Patient returns today to evaluate a possible LEFT breast asymmetry with calcifications questioned on recent screening mammogram. EXAM: DIGITAL DIAGNOSTIC LEFT MAMMOGRAM WITH CAD AND TOMO ULTRASOUND LEFT BREAST COMPARISON:  Previous exams including recent screening mammogram dated 07/04/2017. ACR Breast Density Category c: The breast tissue is heterogeneously dense, which may obscure small masses. FINDINGS: There is a developing asymmetry within the posterior LEFT breast, slightly outer on the CC projection, best seen on spot compression CC slice 59, inferior LEFT breast on the MLO slice 57. Several loosely grouped punctate calcifications are present within the asymmetry. Mammographic  images were processed with CAD. Targeted ultrasound is performed, evaluating the entire LEFT breast with particular attention to the lower outer quadrant, showing only normal fibroglandular tissues and fat lobules throughout. No suspicious solid or cystic mass is identified. IMPRESSION: Developing asymmetry within the posterior LEFT breast, slightly outer, best seen on CC views, without sonographic correlate. This is a suspicious finding for which stereotactic biopsy is recommended. RECOMMENDATION: Stereotactic biopsy, with 3D tomosynthesis guidance, for the developing asymmetry within the posterior LEFT breast. Stereotactic biopsy, with 3D tomosynthesis guidance, is scheduled for June 17th. I have discussed the findings and recommendations with the patient. Results were also provided in writing at the conclusion of the visit. If applicable, a reminder letter will be sent to the patient regarding the next appointment. BI-RADS CATEGORY  4: Suspicious. Electronically Signed   By: Franki Cabot M.D.   On: 07/10/2017 15:38   Mm 3d Screen Breast Bilateral  Result Date: 07/04/2017 CLINICAL DATA:  Screening. EXAM: DIGITAL SCREENING BILATERAL MAMMOGRAM WITH TOMO AND CAD COMPARISON:  Previous exam(s). ACR Breast Density Category c: The  breast tissue is heterogeneously dense, which may obscure small masses. FINDINGS: In the left breast, a possible asymmetry with associated microcalcifications warrants further evaluation. In the right breast, no findings suspicious for malignancy. Images were processed with CAD. IMPRESSION: Further evaluation is suggested for possible asymmetry with associated microcalcifications in the left breast. RECOMMENDATION: Diagnostic mammogram and possibly ultrasound of the left breast. (Code:[id number]) The patient will be contacted regarding the findings, and additional imaging will be scheduled. BI-RADS CATEGORY  0: Incomplete. Need additional imaging evaluation and/or prior mammograms for  comparison. Electronically Signed   By: Fidela Salisbury M.D.   On: 07/04/2017 16:37   Mm Clip Placement Left  Result Date: 07/14/2017 CLINICAL DATA:  62 year old female status post attempted stereotactic biopsy of a left breast developing asymmetry. EXAM: DIAGNOSTIC LEFT MAMMOGRAM POST STEREOTACTIC BIOPSY COMPARISON:  Previous exam(s). FINDINGS: Mammographic images were obtained following attempted stereotactic guided biopsy of a left breast developing asymmetry. Post biopsy changes are demonstrated along the far posterior left breast centrally. However, the previously identified developing asymmetry and associated calcifications is not definitively along the post biopsy air tract. Residual target is demonstrated in the central slightly lateral aspect at far posterior depth. It is felt that this area was likely not adequately sampled. A post biopsy clip was not placed due to movement of the target during biopsy. Fortunately residual calcifications remain at the original target. IMPRESSION: 1. Likely inadequate sampling of the targeted developing asymmetry and associated calcifications due to the far posterior left breast. Fortunately, residual calcifications remain at the original target if needle localization is required. 2. Pending pathology results, surgical excision with needle localization of the original target may be required. A final recommendation will be made when the results become available. Final Assessment: Post Procedure Mammograms for Marker Placement Electronically Signed   By: Kristopher Oppenheim M.D.   On: 07/14/2017 10:15   Mm Lt Breast Bx W Loc Dev 1st Lesion Image Bx Spec Stereo Guide  Addendum Date: 07/15/2017   ADDENDUM REPORT: 07/15/2017 14:26 ADDENDUM: Pathology revealed GRADE I-II INVASIVE DUCTAL CARCINOMA, DUCTAL CARCINOMA IN SITU of LEFT breast, lower central, far posterior depth. This was found to be concordant by Dr. Kristopher Oppenheim. Pathology results were discussed with the  patient by telephone. The patient reported doing well after the biopsy with tenderness at the site. Post biopsy instructions and care were reviewed and questions were answered. The patient was encouraged to call [facility] for any additional concerns. A breast MRI is recommended to establish true extent of disease given the density of the breast tissue as well as evaluate the lymph node(s) as an MRI may be used to replace an axillary ultrasound. Please note, a post biopsy clip was not placed at the time of biopsy due to movement of the target during the procedure. A final recommendation on whether clip needs to be placed will be made after evaluation of MRI and based on clinical treatment plan. The patient was referred to [facility] on July 23, 2017. Pathology results reported by Roselind Messier, RN on 07/15/2017. Electronically Signed   By: Kristopher Oppenheim M.D.   On: 07/15/2017 14:26   Result Date: 07/15/2017 CLINICAL DATA:  62 year old female with a suspicious developing asymmetry in the central left breast. EXAM: LEFT BREAST STEREOTACTIC CORE NEEDLE BIOPSY COMPARISON:  Previous exams. FINDINGS: The patient and I discussed the procedure of stereotactic-guided biopsy including benefits and alternatives. We discussed  the high likelihood of a successful procedure. We discussed the risks of the procedure including infection, bleeding, tissue injury, clip migration, and inadequate sampling. Informed written consent was given. The usual time out protocol was performed immediately prior to the procedure. Using sterile technique and 1% Lidocaine as local anesthetic, under stereotactic guidance, a 9 gauge vacuum assisted device was used to perform core needle biopsy of a developing asymmetry with calcifications in the lower central left breast at far posterior depth using a superior approach. Multiple attempts to  reposition the patient were made. The asymmetry was always noted along the far posterior edge of the field of view. At the conclusion of the biopsy, the patient had moved posteriorly, and the asymmetry was no outside the field of view. Therefore, a post biopsy tissue marker could not be placed adequately. Follow-up two view mammogram was performed for further evaluation. Lesion quadrant: Lower inner quadrant IMPRESSION: Attempted stereotactic biopsy of a left breast developing asymmetry. The biopsy was difficult due to the very far posterior location of this asymmetry. Though an attempt was made, the target moved posteriorly during the biopsy and is uncertain whether an adequate sample was obtained. Post biopsy clip could not be placed at the conclusion due to interval movement of the target. Electronically Signed: By: Kristopher Oppenheim M.D. 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  Patient would be a good candidate for breast conservation with lumpectomy and radiation therapy. Patient is interested in breast conservation if technically possible. She will proceed with an MRI for evaluation prior to her surgery. Patient will be seen after her lumpectomy and sentinel node procedure for further evaluation.  Today, I talked to the patient  about the findings and work-up thus far.  We discussed the natural history of left breast cancer and general treatment, highlighting the role of radiotherapy in the management.  We discussed the available radiation techniques, and focused on the details of logistics and delivery.  We reviewed the anticipated acute and late sequelae associated with radiation in this setting.  The patient was encouraged to ask questions that I answered to the best of my ability.    PLAN:  1. MRI 2. Lumpectomy and sentinel node procedure assuming MRI does not show more significant  disease. 3. Adjuvant radiation therapy. She would appear to be a good candidate for hypofractionated accelerated treatment. 4. Aromatase inhibitor      ------------------------------------------------  Blair Promise, PhD, MD  This document serves as a record of services personally performed by Gery Pray, MD. It was created on his behalf by Arlyce Harman, a trained medical scribe. The creation of this record is based on the scribe's personal observations and the provider's statements to them. This document has been checked and approved by the attending provider.

## 2017-07-23 NOTE — Progress Notes (Signed)
rie

## 2017-07-23 NOTE — Assessment & Plan Note (Signed)
07/14/2017:Screening detected left breast asymmetry at 6 o'clock position, could not be measured accurately.  Axillary ultrasound was not performed.  Biopsy of the asymmetry revealed IDC grade 1, ER 95%, PR 60%, Ki-67 2%, HER-2 negative ratio 1.52, T1 NX stage Ia probable clinical stage  Pathology and radiology counseling:Discussed with the patient, the details of pathology including the type of breast cancer,the clinical staging, the significance of ER, PR and HER-2/neu receptors and the implications for treatment. After reviewing the pathology in detail, we proceeded to discuss the different treatment options between surgery, radiation, chemotherapy, antiestrogen therapies.  Recommendations: Breast MRI is recommended to evaluate extent of disease to determine the type of surgery that she will need. 1.  Our goal is breast conserving surgery if the MRI does not show wide extent of disease followed by 2. Oncotype DX testing based on final pathology 3. Adjuvant radiation therapy followed by 4. Adjuvant antiestrogen therapy  Oncotype counseling: I discussed Oncotype DX test. I explained to the patient that this is a 21 gene panel to evaluate patient tumors DNA to calculate recurrence score. This would help determine whether patient has high risk or intermediate risk or low risk breast cancer. She understands that if her tumor was found to be high risk, she would benefit from systemic chemotherapy. If low risk, no need of chemotherapy. If she was found to be intermediate risk, we would need to evaluate the score as well as other risk factors and determine if an abbreviated chemotherapy may be of benefit.  Return to clinic after surgery to discuss final pathology report and then determine if Oncotype DX testing will need to be sent.

## 2017-07-23 NOTE — Progress Notes (Signed)
[place] NOTE  Patient Care Team: Alroy Dust, L.[name], MD as PCP - General (Family Medicine) Rolm Bookbinder, MD as Consulting Physician (General Surgery) Nicholas Lose, MD as Consulting Physician (Hematology and Oncology) Gery Pray, MD as Consulting Physician (Radiation Oncology)  CHIEF COMPLAINTS/PURPOSE OF CONSULTATION:  Newly diagnosed breast cancer  HISTORY OF PRESENTING ILLNESS:  Angela [name] 62 y.o. female is here because of recent diagnosis of left breast cancer.  Patient had a routine screening mammogram that detected an asymmetry in the left breast at 6 o'clock position.  Axillary ultrasound has not been performed.  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  She was presented this morning at the multidisciplinary tumor board and she is here it at the [facility] clinic to discuss her treatment plan.  I reviewed her records extensively and collaborated the history with the patient.  SUMMARY OF ONCOLOGIC HISTORY:   Malignant neoplasm of lower-inner quadrant of left breast in female, estrogen receptor positive ([place])   07/14/2017 Initial Diagnosis    Screening detected left breast asymmetry at 6 o'clock position, could not be measured accurately.  Axillary ultrasound was not performed.  Biopsy of the asymmetry revealed IDC grade 1, ER 95%, PR 60%, Ki-67 2%, HER-2 negative ratio 1.52, T1 NX stage Ia probable clinical stage      MEDICAL HISTORY:  Past Medical History:  Diagnosis Date  . Anxiety   . Asthma   . Breast discharge   . Congestion of right ear   . Depression   . GERD (gastroesophageal reflux disease)   . Headache(784.0)   . Hyperlipidemia   . Hypothyroidism   . Seizures ([place])    hx of during childhood -not since age 84  . Sleep apnea    uses CPAP  . Thygeson's disease   . Thyroid disease   . Tinnitus   . Urticaria     SURGICAL HISTORY: Past Surgical  History:  Procedure Laterality Date  . ANTERIOR AND POSTERIOR REPAIR  2007  . BREAST DUCTAL SYSTEM EXCISION  01/18/2011   Procedure: EXCISION DUCTAL SYSTEM BREAST;  Surgeon: Belva Crome, MD;  Location: [place];  Service: General;  Laterality: Right;  . BREAST EXCISIONAL BIOPSY Right   . BREAST SURGERY  01/18/2011   Right BX  . CYSTOSCOPY WITH URETHROLYSIS N/A 11/01/2014   Procedure: CYSTOSCOPY WITH URETHROLYSIS;  Surgeon: Bjorn Loser, MD;  Location: [facility];  Service: Urology;  Laterality: N/A;  . DILATION AND CURETTAGE OF UTERUS    . PUBOVAGINAL SLING N/A 11/01/2014   Procedure: REMOVAL VAGINAL SLING AND URETHROLYSIS AND CYSTO;  Surgeon: Bjorn Loser, MD;  Location: [facility];  Service: Urology;  Laterality: N/A;  . TUBAL LIGATION  1998  . TYMPANOSTOMY TUBE PLACEMENT  2010   right ear   . WISDOM TOOTH EXTRACTION      SOCIAL HISTORY: Social History   Socioeconomic History  . Marital status: Single    Spouse name: Not on file  . Number of children: Not on file  . Years of education: Not on file  . Highest education level: Not on file  Occupational History  . Not on file  Social Needs  . Financial resource strain: Not on file  . Food insecurity:    Worry: Not on file    Inability: Not on file  . Transportation needs:    Medical: Not on file  Non-medical: Not on file  Tobacco Use  . Smoking status: Never Smoker  . Smokeless tobacco: Never Used  Substance and Sexual Activity  . Alcohol use: No  . Drug use: No  . Sexual activity: Never    Birth control/protection: Post-menopausal    Comment: meno  Lifestyle  . Physical activity:    Days per week: Not on file    Minutes per session: Not on file  . Stress: Not on file  Relationships  . Social connections:    Talks on phone: Not on file    Gets together: Not on file    Attends religious service: Not on file    Active member of club or organization: Not on file    Attends meetings of clubs  or organizations: Not on file    Relationship status: Not on file  . Intimate partner violence:    Fear of current or ex partner: Not on file    Emotionally abused: Not on file    Physically abused: Not on file    Forced sexual activity: Not on file  Other Topics Concern  . Not on file  Social History Narrative  . Not on file    FAMILY HISTORY: Family History  Problem Relation Age of Onset  . Cancer Father        throat  . Cancer Maternal Aunt        breast  . Breast cancer Maternal Aunt        in 74's  . Allergic rhinitis Neg Hx   . Asthma Neg Hx   . Eczema Neg Hx   . Immunodeficiency Neg Hx   . Urticaria Neg Hx     ALLERGIES:  is allergic to aspirin; ibuprofen; and other.  MEDICATIONS:  Current Outpatient Medications  Medication Sig Dispense Refill  . albuterol (PROVENTIL) (2.5 MG/3ML) 0.083% nebulizer solution Take 2.5 mg by nebulization every 6 (six) hours as needed for wheezing or shortness of breath.    . Albuterol Sulfate (PROAIR RESPICLICK) 856 (90 Base) MCG/ACT AEPB Inhale 2 puffs into the lungs every 4 (four) hours as needed. (Patient taking differently: Inhale 2 puffs into the lungs every 6 (six) hours as needed (SOB, wheezing). ) 1 each 1  . fexofenadine (ALLEGRA) 180 MG tablet Take 180 mg by mouth daily.    Marland Kitchen levothyroxine (SYNTHROID, LEVOTHROID) 112 MCG tablet Take 1 tablet (112 mcg total) by mouth daily. 4 tablet 0  . montelukast (SINGULAIR) 10 MG tablet Take 10 mg by mouth at bedtime.    . Multiple Vitamin (MULTIVITAMIN) tablet Take 1 tablet by mouth daily.    Marland Kitchen triamcinolone (NASACORT AQ) 55 MCG/ACT AERO nasal inhaler Place 2 sprays into the nose as needed (allergies).     No current facility-administered medications for this visit.     REVIEW OF SYSTEMS:   Constitutional: Denies fevers, chills or abnormal night sweats Eyes: Denies blurriness of vision, double vision or watery eyes Ears, nose, mouth, throat, and face: Denies mucositis or sore  throat Respiratory: Denies cough, dyspnea or wheezes Cardiovascular: Denies palpitation, chest discomfort or lower extremity swelling Gastrointestinal:  Denies nausea, heartburn or change in bowel habits Skin: Denies abnormal skin rashes Lymphatics: Denies new lymphadenopathy or easy bruising Neurological:Denies numbness, tingling or new weaknesses Behavioral/Psych: Mood is stable, no new changes  Breast:  Denies any palpable lumps or discharge All other systems were reviewed with the patient and are negative.  PHYSICAL EXAMINATION: ECOG PERFORMANCE STATUS: 1 - Symptomatic but completely ambulatory  Vitals:   07/23/17 0845  BP: 127/89  Pulse: 69  Resp: 20  Temp: 98.9 F (37.2 C)  SpO2: 100%   Filed Weights   07/23/17 0845  Weight: 228 lb 4.8 oz (103.6 kg)    GENERAL:alert, no distress and comfortable SKIN: skin color, texture, turgor are normal, no rashes or significant lesions EYES: normal, conjunctiva are pink and non-injected, sclera clear OROPHARYNX:no exudate, no erythema and lips, buccal mucosa, and tongue normal  NECK: supple, thyroid normal size, non-tender, without nodularity LYMPH:  no palpable lymphadenopathy in the cervical, axillary or inguinal LUNGS: clear to auscultation and percussion with normal breathing effort HEART: regular rate & rhythm and no murmurs and no lower extremity edema ABDOMEN:abdomen soft, non-tender and normal bowel sounds Musculoskeletal:no cyanosis of digits and no clubbing  PSYCH: alert & oriented x 3 with fluent speech NEURO: no focal motor/sensory deficits BREAST: No palpable nodules in breast. No palpable axillary or supraclavicular lymphadenopathy (exam performed in the presence of a chaperone)   LABORATORY DATA:  I have reviewed the data as listed Lab Results  Component Value Date   WBC 6.4 07/23/2017   HGB 13.5 07/23/2017   HCT 41.2 07/23/2017   MCV 74.7 (L) 07/23/2017   PLT 251 07/23/2017   Lab Results  Component Value  Date   NA 144 07/23/2017   K 4.0 07/23/2017   CL 109 07/23/2017   CO2 24 07/23/2017    RADIOGRAPHIC STUDIES: I have personally reviewed the radiological reports and agreed with the findings in the report.  ASSESSMENT AND PLAN:  Malignant neoplasm of lower-inner quadrant of left breast in female, estrogen receptor positive ([place]) 07/14/2017:Screening detected left breast asymmetry at 6 o'clock position, could not be measured accurately.  Axillary ultrasound was not performed.  Biopsy of the asymmetry revealed IDC grade 1, ER 95%, PR 60%, Ki-67 2%, HER-2 negative ratio 1.52, T1 NX stage Ia probable clinical stage  Pathology and radiology counseling:Discussed with the patient, the details of pathology including the type of breast cancer,the clinical staging, the significance of ER, PR and HER-2/neu receptors and the implications for treatment. After reviewing the pathology in detail, we proceeded to discuss the different treatment options between surgery, radiation, chemotherapy, antiestrogen therapies.  Recommendations: Breast MRI is recommended to evaluate extent of disease to determine the type of surgery that she will need. 1.  Our goal is breast conserving surgery if the MRI does not show wide extent of disease followed by 2. Oncotype DX testing based on final pathology (if the tumor is less than 1 cm and grade 1, then she will not need Oncotype) 3. Adjuvant radiation therapy followed by 4. Adjuvant antiestrogen therapy  Return to clinic after surgery to discuss final pathology report and then determine if Oncotype DX testing will need to be sent.   All questions were answered. The patient knows to call the clinic with any problems, questions or concerns.    Harriette Ohara, MD 07/23/17

## 2017-07-28 DIAGNOSIS — C50919 Malignant neoplasm of unspecified site of unspecified female breast: Secondary | ICD-10-CM

## 2017-07-28 HISTORY — DX: Malignant neoplasm of unspecified site of unspecified female breast: C50.919

## 2017-07-30 ENCOUNTER — Ambulatory Visit (HOSPITAL_COMMUNITY)
Admission: RE | Admit: 2017-07-30 | Discharge: 2017-07-30 | Disposition: A | Discharge status: Home / Self Care | Payer: Managed Care, Other (non HMO) | Source: Ambulatory Visit | Attending: General Surgery | Admitting: General Surgery

## 2017-07-30 DIAGNOSIS — C50312 Malignant neoplasm of lower-inner quadrant of left female breast: Secondary | ICD-10-CM | POA: Insufficient documentation

## 2017-07-30 DIAGNOSIS — Z170 Estrogen receptor positive status [ER+]: Secondary | ICD-10-CM | POA: Diagnosis not present

## 2017-07-30 MED ORDER — GADOBENATE DIMEGLUMINE 529 MG/ML IV SOLN
20.0000 mL | Freq: Once | INTRAVENOUS | Status: AC | PRN
  Administered 2017-07-30: 20 mL via INTRAVENOUS

## 2017-08-04 ENCOUNTER — Other Ambulatory Visit: Payer: Self-pay | Admitting: General Surgery

## 2017-08-04 ENCOUNTER — Telehealth: Payer: Self-pay | Admitting: *Deleted

## 2017-08-04 DIAGNOSIS — R599 Enlarged lymph nodes, unspecified: Secondary | ICD-10-CM

## 2017-08-04 NOTE — Telephone Encounter (Signed)
Left message to follow up from [facility]. 

## 2017-08-07 ENCOUNTER — Other Ambulatory Visit: Payer: Self-pay | Admitting: General Surgery

## 2017-08-07 ENCOUNTER — Ambulatory Visit
Admission: RE | Admit: 2017-08-07 | Discharge: 2017-08-07 | Disposition: A | Discharge status: Home / Self Care | Payer: Managed Care, Other (non HMO) | Source: Ambulatory Visit | Attending: General Surgery | Admitting: General Surgery

## 2017-08-07 DIAGNOSIS — R599 Enlarged lymph nodes, unspecified: Secondary | ICD-10-CM

## 2017-08-07 DIAGNOSIS — R9389 Abnormal findings on diagnostic imaging of other specified body structures: Secondary | ICD-10-CM

## 2017-08-15 ENCOUNTER — Ambulatory Visit
Admission: RE | Admit: 2017-08-15 | Discharge: 2017-08-15 | Disposition: A | Discharge status: Home / Self Care | Payer: Managed Care, Other (non HMO) | Source: Ambulatory Visit | Attending: General Surgery | Admitting: General Surgery

## 2017-08-15 DIAGNOSIS — R9389 Abnormal findings on diagnostic imaging of other specified body structures: Secondary | ICD-10-CM

## 2017-08-15 MED ORDER — GADOBENATE DIMEGLUMINE 529 MG/ML IV SOLN
20.0000 mL | Freq: Once | INTRAVENOUS | Status: AC | PRN
  Administered 2017-08-15: 20 mL via INTRAVENOUS

## 2017-08-26 ENCOUNTER — Telehealth: Payer: Self-pay | Admitting: *Deleted

## 2017-08-26 MED ORDER — LETROZOLE 2.5 MG PO TABS: 2.5000 mg | ORAL_TABLET | Freq: Every day | ORAL | 6 refills | Status: DC

## 2017-08-26 NOTE — Telephone Encounter (Signed)
Spoke to patient to discuss starting letrozole.  Medication called into her pharmacy. Informed her Dr. Lindi Adie will see in her in about a month to see how she is tolerating the letrozole.

## 2017-08-28 ENCOUNTER — Telehealth: Payer: Self-pay | Admitting: Hematology and Oncology

## 2017-08-28 NOTE — Telephone Encounter (Signed)
Per 7/30 sch msg.  Called patient and left message regarding added appt w/ [facility].

## 2017-08-28 NOTE — Telephone Encounter (Signed)
Per 7/31 los.  Mailed calendar.

## 2017-09-03 ENCOUNTER — Telehealth: Payer: Self-pay | Admitting: Hematology and Oncology

## 2017-09-03 NOTE — Telephone Encounter (Signed)
Per 7/30 sch msg.  Called patient with appt with Dr. Lindi Adie.  Mailed calendar.

## 2017-09-25 ENCOUNTER — Ambulatory Visit: Payer: Managed Care, Other (non HMO) | Admitting: Hematology and Oncology

## 2017-09-26 ENCOUNTER — Inpatient Hospital Stay: Payer: Managed Care, Other (non HMO) | Attending: Hematology and Oncology | Admitting: Hematology and Oncology

## 2017-09-26 ENCOUNTER — Telehealth: Payer: Self-pay | Admitting: Hematology and Oncology

## 2017-09-26 DIAGNOSIS — Z79811 Long term (current) use of aromatase inhibitors: Secondary | ICD-10-CM | POA: Insufficient documentation

## 2017-09-26 DIAGNOSIS — Z170 Estrogen receptor positive status [ER+]: Secondary | ICD-10-CM | POA: Diagnosis not present

## 2017-09-26 DIAGNOSIS — C50312 Malignant neoplasm of lower-inner quadrant of left female breast: Secondary | ICD-10-CM

## 2017-09-26 NOTE — Telephone Encounter (Signed)
Gave patient avs and calendar.   °

## 2017-09-26 NOTE — Progress Notes (Signed)
Patient Care Team: Alroy Dust, L.[name], MD as PCP - General (Family Medicine) Rolm Bookbinder, MD as Consulting Physician (General Surgery) Nicholas Lose, MD as Consulting Physician (Hematology and Oncology) Gery Pray, MD as Consulting Physician (Radiation Oncology)  DIAGNOSIS:  Encounter Diagnosis  Name Primary?  . Malignant neoplasm of lower-inner quadrant of left breast in female, estrogen receptor positive ([place])     SUMMARY OF ONCOLOGIC HISTORY:   Malignant neoplasm of lower-inner quadrant of left breast in female, estrogen receptor positive ([place])   07/14/2017 Initial Diagnosis    Screening detected left breast asymmetry at 6 o'clock position, could not be measured accurately.  Axillary ultrasound was not performed.  Biopsy of the asymmetry revealed IDC grade 1, ER 95%, PR 60%, Ki-67 2%, HER-2 negative ratio 1.52, T1 NX stage Ia probable clinical stage    07/30/2017 Breast MRI    Biopsy-proven left breast 4.4 cm with non-mass enhancement overall extent 5.5 cm.  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  Biopsies revealed DCIS.  Axillary biopsy was negative but it did not have lymphoid tissue.   She saw Dr. Donne Hazel and refused to undergo mastectomy and she was started on antiestrogen therapy with letrozole 2.5 mg daily.  She is here today to discuss side effects of treatment.  She appears to be tolerating it moderately well.  She does have occasional hot flashes.  Initially she had nausea which has subsided.  REVIEW OF SYSTEMS:   Constitutional: Denies fevers, chills or abnormal weight loss Eyes: Denies blurriness of vision Ears, nose, mouth, throat, and face: Denies mucositis or sore throat Respiratory: Denies cough, dyspnea or wheezes Cardiovascular: Denies palpitation, chest discomfort Gastrointestinal:  Denies nausea, heartburn or change in bowel habits Skin: Denies abnormal skin rashes Lymphatics: Denies new lymphadenopathy or easy bruising Neurological:Denies numbness, tingling or new weaknesses Behavioral/Psych: Mood is stable, no new changes  Extremities: No lower extremity edema Breast: Weird sensations in the breast All other systems were reviewed with the patient and are negative.  I have reviewed the past medical history, past surgical history, social history and family history with the patient and they are unchanged from previous note.  ALLERGIES:  is allergic to aspirin; ibuprofen; and other.  MEDICATIONS:  Current Outpatient Medications  Medication Sig Dispense Refill  . albuterol (PROVENTIL) (2.5 MG/3ML) 0.083% nebulizer solution Take 2.5 mg by nebulization every 6 (six) hours as needed for wheezing or shortness of breath.    . Albuterol Sulfate (PROAIR RESPICLICK) 161 (90 Base) MCG/ACT AEPB Inhale 2 puffs into the lungs every 4 (four) hours as needed. (Patient taking differently: Inhale 2 puffs into the lungs every 6 (six) hours as needed (SOB, wheezing). ) 1 each  1  . fexofenadine (ALLEGRA) 180 MG tablet Take 180 mg by mouth daily.    Marland Kitchen letrozole (FEMARA) 2.5 MG tablet Take 1 tablet (2.5 mg total) by mouth daily. 30 tablet 6  . levothyroxine (SYNTHROID,  LEVOTHROID) 112 MCG tablet Take 1 tablet (112 mcg total) by mouth daily. 4 tablet 0  . montelukast (SINGULAIR) 10 MG tablet Take 10 mg by mouth at bedtime.    . Multiple Vitamin (MULTIVITAMIN) tablet Take 1 tablet by mouth daily.    Marland Kitchen triamcinolone (NASACORT AQ) 55 MCG/ACT AERO nasal inhaler Place 2 sprays into the nose as needed (allergies).     No current facility-administered medications for this visit.     PHYSICAL EXAMINATION: ECOG PERFORMANCE STATUS: 1 - Symptomatic but completely ambulatory  Vitals:   09/26/17 1141  BP: (!) 139/104  Pulse: 82  Resp: 18  Temp: 98.4 F (36.9 C)  SpO2: 99%   Filed Weights   09/26/17 1141  Weight: 231 lb 4.8 oz (104.9 kg)    GENERAL:alert, no distress and comfortable SKIN: skin color, texture, turgor are normal, no rashes or significant lesions EYES: normal, Conjunctiva are pink and non-injected, sclera clear OROPHARYNX:no exudate, no erythema and lips, buccal mucosa, and tongue normal  NECK: supple, thyroid normal size, non-tender, without nodularity LYMPH:  no palpable lymphadenopathy in the cervical, axillary or inguinal LUNGS: clear to auscultation and percussion with normal breathing effort HEART: regular rate & rhythm and no murmurs and no lower extremity edema ABDOMEN:abdomen soft, non-tender and normal bowel sounds MUSCULOSKELETAL:no cyanosis of digits and no clubbing  NEURO: alert & oriented x 3 with fluent speech, no focal motor/sensory deficits EXTREMITIES: No lower extremity edema   LABORATORY DATA:  I have reviewed the data as listed CMP Latest Ref Rng & Units 07/23/2017 08/11/2016 08/10/2016  Glucose 70 - 99 mg/dL 95 137(H) 115(H)  BUN 6 - 20 mg/dL '14 12 11  '$ Creatinine 0.44 - 1.00 mg/dL 0.94 0.90 0.88  Sodium 135 - 145 mmol/L 144 139 143  Potassium 3.5 - 5.1 mmol/L 4.0 4.6 3.4(L)  Chloride 98 - 111 mmol/L 109 107 109  CO2 22 - 32 mmol/L '24 23 26  '$ Calcium 8.9 - 10.3 mg/dL 9.6 9.2 9.1  Total Protein 6.5 - 8.1 g/dL 7.7 - -    Total Bilirubin 0.3 - 1.2 mg/dL 0.2(L) - -  Alkaline Phos 38 - 126 U/L 79 - -  AST 15 - 41 U/L 11(L) - -  ALT 0 - 44 U/L 12 - -    Lab Results  Component Value Date   WBC 6.4 07/23/2017   HGB 13.5 07/23/2017   HCT 41.2 07/23/2017   MCV 74.7 (L) 07/23/2017   PLT 251 07/23/2017   NEUTROABS 4.3 07/23/2017    ASSESSMENT & PLAN:  Malignant neoplasm of lower-inner quadrant of left breast in female, estrogen receptor positive ([place]) 07/14/2017:Screening detected left breast asymmetry at 6 o'clock position, could not be measured accurately.  Axillary ultrasound was not performed.  Biopsy of the asymmetry revealed IDC grade 1, ER 95%, PR 60%, Ki-67 2%, HER-2 negative ratio 1.52, T1 NX stage Ia probable clinical stage  07/30/2017: Breast OVP:[id number] left breast 4.4 cm with non-mass enhancement overall extent 5.5 cm.  Additional clumped non-mass enhancement left breast 2 sites middle depth anterior depth suspicious for multifocal disease overall extent 8 cm, 5 morphologically abnormal lymph nodes in the left axilla 2 prominent lymph nodes in the right axilla  Additional biopsies left breast showed DCIS Left axillary  lymph node biopsy came back benign although there was no lymphoid tissue.  Current treatment: Neoadjuvant antiestrogen therapy with letrozole started 08/26/2017.  Letrozole toxicities: Denies any major side effects.  Does have occasional hot flashes and mild fatigue.  Plan is to stay on antiestrogen therapy for 6 months and repeat breast MRI.  She will come back in 3 months for a breast exam and follow-up.    No orders of the defined types were placed in this encounter.  The patient has a good understanding of the overall plan. she agrees with it. she will call with any problems that may develop before the next visit here.   Harriette Ohara, MD 09/26/17

## 2017-09-26 NOTE — Assessment & Plan Note (Signed)
07/14/2017:Screening detected left breast asymmetry at 6 o'clock position, could not be measured accurately.  Axillary ultrasound was not performed.  Biopsy of the asymmetry revealed IDC grade 1, ER 95%, PR 60%, Ki-67 2%, HER-2 negative ratio 1.52, T1 NX stage Ia probable clinical stage  07/30/2017: Breast RNH:[id number] left breast 4.4 cm with non-mass enhancement overall extent 5.5 cm.  Additional clumped non-mass enhancement left breast 2 sites middle depth anterior depth suspicious for multifocal disease overall extent 8 cm, 5 morphologically abnormal lymph nodes in the left axilla 2 prominent lymph nodes in the right axilla  Additional biopsies left breast showed DCIS Left axillary lymph node biopsy came back benign although there was no lymphoid tissue.  Current treatment: Neoadjuvant antiestrogen therapy with letrozole started 08/26/2017.  Letrozole toxicities: Denies any major side effects.  Does have occasional hot flashes and mild fatigue.  Plan is to stay on antiestrogen therapy for 6 months and repeat breast MRI.  She will come back in 3 months for a breast exam and follow-up.

## 2017-11-06 ENCOUNTER — Other Ambulatory Visit: Payer: Self-pay | Admitting: *Deleted

## 2017-11-06 DIAGNOSIS — N644 Mastodynia: Secondary | ICD-10-CM

## 2017-11-06 DIAGNOSIS — C50312 Malignant neoplasm of lower-inner quadrant of left female breast: Secondary | ICD-10-CM

## 2017-11-06 DIAGNOSIS — Z170 Estrogen receptor positive status [ER+]: Principal | ICD-10-CM

## 2017-11-07 ENCOUNTER — Telehealth: Payer: Self-pay | Admitting: *Deleted

## 2017-11-07 NOTE — Telephone Encounter (Signed)
Received call from patient stating she has been having some discomfort for the past week or so in her left breast and axilla.  Per Dr. Lindi Adie I have scheduled her for a diag bil mammo and u/s of both axilla since she had previous abnormal lymph nodes on the right side as well.  Confirmed appointment for 11/10/17 at 1:50pm.  Patient is aware.

## 2017-11-10 ENCOUNTER — Ambulatory Visit
Admission: RE | Admit: 2017-11-10 | Discharge: 2017-11-10 | Disposition: A | Discharge status: Home / Self Care | Payer: Managed Care, Other (non HMO) | Source: Ambulatory Visit | Attending: Hematology and Oncology | Admitting: Hematology and Oncology

## 2017-11-10 DIAGNOSIS — Z170 Estrogen receptor positive status [ER+]: Principal | ICD-10-CM

## 2017-11-10 DIAGNOSIS — N644 Mastodynia: Secondary | ICD-10-CM

## 2017-11-10 DIAGNOSIS — C50312 Malignant neoplasm of lower-inner quadrant of left female breast: Secondary | ICD-10-CM

## 2017-11-10 HISTORY — DX: Malignant neoplasm of unspecified site of unspecified female breast: C50.919

## 2017-11-26 ENCOUNTER — Telehealth: Payer: Self-pay | Admitting: Hematology and Oncology

## 2017-11-26 ENCOUNTER — Inpatient Hospital Stay: Payer: 59 | Attending: Hematology and Oncology | Admitting: Hematology and Oncology

## 2017-11-26 DIAGNOSIS — Z170 Estrogen receptor positive status [ER+]: Secondary | ICD-10-CM | POA: Insufficient documentation

## 2017-11-26 DIAGNOSIS — Z79811 Long term (current) use of aromatase inhibitors: Secondary | ICD-10-CM | POA: Insufficient documentation

## 2017-11-26 DIAGNOSIS — C50312 Malignant neoplasm of lower-inner quadrant of left female breast: Secondary | ICD-10-CM | POA: Insufficient documentation

## 2017-11-26 DIAGNOSIS — Z79899 Other long term (current) drug therapy: Secondary | ICD-10-CM | POA: Diagnosis not present

## 2017-11-26 DIAGNOSIS — N951 Menopausal and female climacteric states: Secondary | ICD-10-CM | POA: Diagnosis not present

## 2017-11-26 NOTE — Telephone Encounter (Signed)
No 10/30 los.

## 2017-11-26 NOTE — Progress Notes (Signed)
Patient Care Team: Alroy Dust, L.[name], MD as PCP - General (Family Medicine) Rolm Bookbinder, MD as Consulting Physician (General Surgery) Nicholas Lose, MD as Consulting Physician (Hematology and Oncology) Gery Pray, MD as Consulting Physician (Radiation Oncology)  DIAGNOSIS:  Encounter Diagnosis  Name Primary?  . Malignant neoplasm of lower-inner quadrant of left breast in female, estrogen receptor positive ([place])     SUMMARY OF ONCOLOGIC HISTORY:   Malignant neoplasm of lower-inner quadrant of left breast in female, estrogen receptor positive ([place])   07/14/2017 Initial Diagnosis    Screening detected left breast asymmetry at 6 o'clock position, could not be measured accurately.  Axillary ultrasound was not performed.  Biopsy of the asymmetry revealed IDC grade 1, ER 95%, PR 60%, Ki-67 2%, HER-2 negative ratio 1.52, T1 NX stage Ia probable clinical stage    07/30/2017 Breast MRI    Biopsy-proven left breast 4.4 cm with non-mass enhancement overall extent 5.5 cm.  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  This led to mammograms and ultrasounds and a follow-up with Dr. Donne Hazel.  She finally decided to proceed with surgery.   She did not have any side effects with letrozole therapy.  REVIEW OF SYSTEMS:   Constitutional: Denies fevers, chills or abnormal weight loss Eyes: Denies blurriness of vision Ears, nose, mouth, throat, and face: Denies mucositis or sore throat Respiratory: Denies cough, dyspnea or wheezes Cardiovascular: Denies palpitation, chest discomfort Gastrointestinal:  Denies nausea, heartburn or change in bowel habits Skin: Denies abnormal skin rashes Lymphatics: Denies new lymphadenopathy or easy bruising Neurological:Denies numbness, tingling or new weaknesses Behavioral/Psych: Mood is stable, no new changes  Extremities: No lower extremity edema Breast: Left breast pain and discomfort All other systems were reviewed with the patient and are negative.  I have reviewed the past medical history, past surgical history, social history and family history with the patient and they are unchanged from previous note.  ALLERGIES:  is allergic to aspirin; ibuprofen; and other.  MEDICATIONS:  Current Outpatient Medications  Medication Sig Dispense Refill  . albuterol (PROVENTIL) (2.5 MG/3ML) 0.083% nebulizer solution Take 2.5 mg by nebulization every 6 (six) hours as needed for wheezing or shortness of breath.    . Albuterol Sulfate (PROAIR RESPICLICK) 409 (90 Base) MCG/ACT AEPB Inhale 2 puffs into the lungs every 4 (four) hours as needed. (Patient taking differently: Inhale 2 puffs into the lungs every 6 (six) hours as needed (SOB, wheezing). ) 1 each 1  . fexofenadine (ALLEGRA) 180 MG tablet Take 180 mg by mouth daily.    Marland Kitchen letrozole (FEMARA) 2.5 MG tablet Take 1 tablet (2.5 mg total) by mouth daily. 30 tablet 6  . levothyroxine (SYNTHROID, LEVOTHROID) 112 MCG tablet Take 1 tablet (112 mcg total) by mouth daily. 4  tablet 0  . montelukast (SINGULAIR) 10 MG tablet Take 10 mg by mouth at bedtime.    . Multiple Vitamin (MULTIVITAMIN) tablet Take 1 tablet by mouth daily.    Marland Kitchen triamcinolone (NASACORT AQ) 55  MCG/ACT AERO nasal inhaler Place 2 sprays into the nose as needed (allergies).     No current facility-administered medications for this visit.     PHYSICAL EXAMINATION: ECOG PERFORMANCE STATUS: 1 - Symptomatic but completely ambulatory  Vitals:   11/26/17 0826  BP: (!) 151/111  Pulse: 87  Resp: 17  Temp: 98.8 F (37.1 C)  SpO2: 100%   Filed Weights   11/26/17 0826  Weight: 213 lb (96.6 kg)    GENERAL:alert, no distress and comfortable SKIN: skin color, texture, turgor are normal, no rashes or significant lesions EYES: normal, Conjunctiva are pink and non-injected, sclera clear OROPHARYNX:no exudate, no erythema and lips, buccal mucosa, and tongue normal  NECK: supple, thyroid normal size, non-tender, without nodularity LYMPH:  no palpable lymphadenopathy in the cervical, axillary or inguinal LUNGS: clear to auscultation and percussion with normal breathing effort HEART: regular rate & rhythm and no murmurs and no lower extremity edema ABDOMEN:abdomen soft, non-tender and normal bowel sounds MUSCULOSKELETAL:no cyanosis of digits and no clubbing  NEURO: alert & oriented x 3 with fluent speech, no focal motor/sensory deficits EXTREMITIES: No lower extremity edema   LABORATORY DATA:  I have reviewed the data as listed CMP Latest Ref Rng & Units 07/23/2017 08/11/2016 08/10/2016  Glucose 70 - 99 mg/dL 95 137(H) 115(H)  BUN 6 - 20 mg/dL '14 12 11  '$ Creatinine 0.44 - 1.00 mg/dL 0.94 0.90 0.88  Sodium 135 - 145 mmol/L 144 139 143  Potassium 3.5 - 5.1 mmol/L 4.0 4.6 3.4(L)  Chloride 98 - 111 mmol/L 109 107 109  CO2 22 - 32 mmol/L '24 23 26  '$ Calcium 8.9 - 10.3 mg/dL 9.6 9.2 9.1  Total Protein 6.5 - 8.1 g/dL 7.7 - -  Total Bilirubin 0.3 - 1.2 mg/dL 0.2(L) - -  Alkaline Phos 38 - 126 U/L 79 - -  AST 15 - 41 U/L 11(L) - -  ALT 0 - 44 U/L 12 - -    Lab Results  Component Value Date   WBC 6.4 07/23/2017   HGB 13.5 07/23/2017   HCT 41.2 07/23/2017   MCV 74.7 (L) 07/23/2017   PLT  251 07/23/2017   NEUTROABS 4.3 07/23/2017    ASSESSMENT & PLAN:  Malignant neoplasm of lower-inner quadrant of left breast in female, estrogen receptor positive ([place]) 07/14/2017:Screening detected left breast asymmetry at 6 o'clock position, could not be measured accurately. Axillary ultrasound was not performed. Biopsy of the asymmetry revealed IDC grade 1, ER 95%, PR 60%, Ki-67 2%, HER-2 negative ratio 1.52, T1 NX stage Ia probable clinical stage  07/30/2017: Breast [id number]:[id number] left breast 4.4 cm with non-mass enhancement overall extent 5.5 cm.  Additional clumped non-mass enhancement left breast 2 sites middle depth anterior depth suspicious for multifocal disease overall extent 8 cm, 5 morphologically abnormal lymph nodes in the left axilla 2 prominent lymph nodes in the right axilla  Additional biopsies left breast showed DCIS Left axillary lymph node biopsy came back benign although there was no lymphoid tissue.  Current treatment: Neoadjuvant antiestrogen therapy with letrozole started 08/26/2017.  Letrozole toxicities: Denies any major side effects.  Does have occasional hot flashes and mild fatigue.  Based on her symptoms of pain and discomfort, patient has decided that she wants to proceed with surgery.  I support this decision and she will call Dr. Donne Hazel and get her surgery soon. She will be undergoing a mastectomy.  She will be meeting with plastic surgeon to discuss reconstruction options. Because she will need a mastectomy there is no need to put any additional markers.  She will come back in 1 week after surgery to discuss the pathology report.  No orders of the defined types were placed in this encounter.  The patient has a good understanding of the overall plan. she agrees with it. she will call with any problems that may develop before the next visit here.   Harriette Ohara, MD 11/26/17

## 2017-11-26 NOTE — Assessment & Plan Note (Signed)
07/14/2017:Screening detected left breast asymmetry at 6 o'clock position, could not be measured accurately. Axillary ultrasound was not performed. Biopsy of the asymmetry revealed IDC grade 1, ER 95%, PR 60%, Ki-67 2%, HER-2 negative ratio 1.52, T1 NX stage Ia probable clinical stage  07/30/2017: Breast [id number] left breast 4.4 cm with non-mass enhancement overall extent 5.5 cm.  Additional clumped non-mass enhancement left breast 2 sites middle depth anterior depth suspicious for multifocal disease overall extent 8 cm, 5 morphologically abnormal lymph nodes in the left axilla 2 prominent lymph nodes in the right axilla  Additional biopsies left breast showed DCIS Left axillary lymph node biopsy came back benign although there was no lymphoid tissue.  Current treatment: Neoadjuvant antiestrogen therapy with letrozole started 08/26/2017.  Letrozole toxicities: Denies any major side effects.  Does have occasional hot flashes and mild fatigue.  Plan is to stay on antiestrogen therapy for 6 months and repeat breast MRI.  She will come back in 3 months for a breast exam and follow-up.

## 2017-12-10 DIAGNOSIS — E669 Obesity, unspecified: Secondary | ICD-10-CM | POA: Insufficient documentation

## 2017-12-23 ENCOUNTER — Other Ambulatory Visit: Payer: Self-pay | Admitting: General Surgery

## 2017-12-23 DIAGNOSIS — C50912 Malignant neoplasm of unspecified site of left female breast: Secondary | ICD-10-CM

## 2017-12-26 ENCOUNTER — Telehealth: Payer: Self-pay | Admitting: Hematology and Oncology

## 2017-12-26 NOTE — Telephone Encounter (Signed)
Tried to reach regarding change

## 2017-12-28 HISTORY — PX: MASTECTOMY: SHX3

## 2017-12-30 ENCOUNTER — Telehealth: Payer: Self-pay | Admitting: Hematology and Oncology

## 2017-12-30 NOTE — Telephone Encounter (Signed)
Patient called to reschedule  °

## 2017-12-31 NOTE — H&P (Signed)
Subjective:     Patient ID: Angela [name] is a 62 y.o. female.  HPI  Patient of Drs. Rande Lawman here for follow up discussion breast reconstruction prior to planned mastectomy. Presented following screening MMG 06/2017 with left breast asymmetry. No [place] correlate. Biopsy with IDC with DCIS, ER/PR+, Her2-. MRI demonstrated biopsy-proven carcinoma within the LOQ 4.4 cm, with nearly contiguous NME to 5.5 cm. Additional clumped NME within the LOQ, at middle depth and anterior depth noted with overall extent over 8 cm within the outer LEFT breast. At least 5 morphologically abnormal LN in the LEFT axilla noted.  Additional biopsies labeled left anterior interior and lower outer with high grade DCIS. Biopsy LN read as benign with no lymphoid tissue.   Started on neoadjuvant letrozole. Plan was for 6 months of this prior to reimaging. Reported increased pain and underwent MMG/US 10/2017- no adenopathy bilateral noted, left breast LOQ 3.1 cm mass noted, second 7 mm nodule in the lower outer may represent 1 of the sites of biopsy-proven DCIS. Plan to proceed with mastectomy left, no additional imaging scheduled/completed.   Current 38 B, desires larger. Wt down 11 lb through diet over last year.[place] but family nearby. Works at call center for [employer].  PMH includes OSA on CPAP.   Review of Systems  Constitutional: Positive for fatigue.  HENT: Positive for sinus pressure.   Eyes: Positive for visual disturbance.  Respiratory: Positive for wheezing.   Musculoskeletal: Positive for arthralgias.  Allergic/Immunologic: Positive for environmental allergies.  Remainder 12 point review negative    Objective:   Physical Exam  Cardiovascular: Normal rate, regular rhythm and normal heart sounds.  Pulmonary/Chest: Effort normal and breath sounds normal.  Lymphadenopathy:    She has no axillary adenopathy.   Grade 1 ptosis bilateral SN to nipple R 24 L 24 cm BW R 22 L22 (CW 15  cm) Nipple to IMF 8 L 8 cm    Assessment:     Left breast ca overlapping sites ER+,  Neoadjuvant letrozole    Plan:     Plan left nipple sparing mastectomy with immediate expander, acellular dermis reconstruction.  Reviewed incisions, drains, OR length, hospital stay and recovery for each. Discussed process of expansion and implant based risks including rupture, MRI surveillance for silicone implants, infection requiring surgery or removal, contracture. Discussed asymmetry one can expect with implant unilateral and natural breast on opposite.   Discussed future surgery dependent on adjuvant treatments. Discussed if RT recommended based on final pathology, would be several months to additional surgery, can have RT with expander in placed.  Discussed use of acellular dermis in reconstruction, cadaveric source, incorporation over several weeks, risk that if has seroma or infection can act as additional nidus for infection if not incorporated.  Discussed prepectoral vs sub pectoral reconstruction. Discussed with patient and benefit of this is no animation deformity, may be less pain. Risk may be more visible rippling over upper poles, greater need of ADM. Reviewed pre pectoral would require larger amount acellular dermis, more drains. Discussed any type reconstruction also risks long term displacement implant and visible rippling. If prepectoral counseled I would recommend she be comfortable with silicone implants as more options that have less rippling. She agrees to prepectoral placement.  Reviewed reconstruction will be asensate and not stimulate. Reviewed additional risks including but not limited to risks mastectomy flap necrosis requiring additional surgery, seroma, hematoma, asymmetry, need to additional procedures, fat necrosis, DVT/PE, damage to adjacent structures, cardiopulmonary complications.  Irene Limbo, MD [facility] [phone], pin  [phone]

## 2018-01-01 ENCOUNTER — Ambulatory Visit: Payer: Managed Care, Other (non HMO) | Admitting: Hematology and Oncology

## 2018-01-01 NOTE — Pre-Procedure Instructions (Signed)
Angela [name]  01/01/2018      [id number] [name], [name] [address] Phone: [phone] Fax: [phone]  [facility] [name], [name] Phone: [phone] Fax: [phone]    Your procedure is scheduled on December 10th.  Report to [facility] [employer] [facility]  Call this number if you have problems the morning of surgery:  [phone]   Remember:  Do not eat after midnight.  You may drink clear liquids until 1115 .  Clear liquids allowed are:                    Water, Juice (non-citric and without pulp), Carbonated beverages, Clear Tea, Black Coffee only and Gatorade   Please finish your Ensure drink by 11:15am.    Take these medicines the morning of surgery with A SIP OF WATER  albuterol (PROVENTIL) (2.5 MG/3ML) 0.083% nebulizer solution (if needed) Albuterol Sulfate (PROAIR RESPICLICK) letrozole (FEMARA)  levothyroxine (SYNTHROID, LEVOTHROID) loratadine (CLARITIN)  7 days prior to surgery STOP taking any Aspirin(unless otherwise instructed by your surgeon), Aleve, Naproxen, Ibuprofen, Motrin, Advil, Goody's, BC's, all herbal medications, fish oil, and all vitamins     Do not wear jewelry, make-up or nail polish.  Do not wear lotions, powders, or perfumes, or deodorant.  Do not shave 48 hours prior to surgery.  Men may shave face and neck.  Do not bring valuables to the hospital.  [facility] is not responsible for any belongings or valuables.  Contacts, dentures or bridgework may not be worn into surgery.  Leave your suitcase in the car.  After surgery it may be brought to your room.  For patients admitted to the hospital, discharge time will be determined by your treatment team.  Patients discharged the day of surgery will not be allowed to drive home.    [facility]- Preparing For Surgery  Before  surgery, you can play an important role. Because skin is not sterile, your skin needs to be as free of germs as possible. You can reduce the number of germs on your skin by washing with CHG (chlorahexidine gluconate) Soap before surgery.  CHG is an antiseptic cleaner which kills germs and bonds with the skin to continue killing germs even after washing.    Oral Hygiene is also important to reduce your risk of infection.  Remember - BRUSH YOUR TEETH THE MORNING OF SURGERY WITH YOUR REGULAR TOOTHPASTE  Please do not use if you have an allergy to CHG or antibacterial soaps. If your skin becomes reddened/irritated stop using the CHG.  Do not shave (including legs and underarms) for at least 48 hours prior to first CHG shower. It is OK to shave your face.  Please follow these instructions carefully.   1. Shower the NIGHT BEFORE SURGERY and the MORNING OF SURGERY with CHG.   2. If you chose to wash your hair, wash your hair first as usual with your normal shampoo.  3. After you shampoo, rinse your hair and body thoroughly to remove the shampoo.  4. Use CHG as you would any other liquid soap. You can apply CHG directly to the skin and wash gently with a scrungie or a clean washcloth.   5. Apply the CHG Soap to your body ONLY FROM THE NECK DOWN.  Do not use on open wounds or  open sores. Avoid contact with your eyes, ears, mouth and genitals (private parts). Wash Face and genitals (private parts)  with your normal soap.  6. Wash thoroughly, paying special attention to the area where your surgery will be performed.  7. Thoroughly rinse your body with warm water from the neck down.  8. DO NOT shower/wash with your normal soap after using and rinsing off the CHG Soap.  9. Pat yourself dry with a CLEAN TOWEL.  10. Wear CLEAN PAJAMAS to bed the night before surgery, wear comfortable clothes the morning of surgery  11. Place CLEAN SHEETS on your bed the night of your first shower and DO NOT SLEEP WITH  PETS.    Day of Surgery:  Do not apply any deodorants/lotions.  Please wear clean clothes to the hospital/surgery center.   Remember to brush your teeth WITH YOUR REGULAR TOOTHPASTE.    Please read over the following fact sheets that you were given.

## 2018-01-02 ENCOUNTER — Other Ambulatory Visit: Payer: Self-pay

## 2018-01-02 ENCOUNTER — Encounter (HOSPITAL_COMMUNITY): Payer: Self-pay

## 2018-01-02 ENCOUNTER — Encounter (HOSPITAL_COMMUNITY)
Admission: RE | Admit: 2018-01-02 | Discharge: 2018-01-02 | Disposition: A | Discharge status: Home / Self Care | Payer: 59 | Source: Ambulatory Visit | Attending: General Surgery | Admitting: General Surgery

## 2018-01-02 DIAGNOSIS — R9431 Abnormal electrocardiogram [ECG] [EKG]: Secondary | ICD-10-CM | POA: Insufficient documentation

## 2018-01-02 DIAGNOSIS — Z01818 Encounter for other preprocedural examination: Secondary | ICD-10-CM | POA: Insufficient documentation

## 2018-01-02 LAB — CBC
HCT: 44.4 % (ref 36.0–46.0)
Hemoglobin: 13.3 g/dL (ref 12.0–15.0)
MCH: 23.1 pg — ABNORMAL LOW (ref 26.0–34.0)
MCHC: 30 g/dL (ref 30.0–36.0)
MCV: 77.1 fL — ABNORMAL LOW (ref 80.0–100.0)
Platelets: 325 10*3/uL (ref 150–400)
RBC: 5.76 MIL/uL — ABNORMAL HIGH (ref 3.87–5.11)
RDW: 15.3 % (ref 11.5–15.5)
WBC: 6.9 10*3/uL (ref 4.0–10.5)
nRBC: 0 % (ref 0.0–0.2)

## 2018-01-02 LAB — NO BLOOD PRODUCTS

## 2018-01-02 NOTE — Progress Notes (Signed)
PCP - Dr. Donnie Coffin   EKG -  21/6/19  Sleep Study - ? CPAP - yes, bringing mask and hose  Blood Thinner Instructions: N/A Aspirin Instructions: N/A  Anesthesia review: none  Patient denies shortness of breath, fever, cough and chest pain at PAT appointment   Patient verbalized understanding of instructions that were given to them at the PAT appointment. Patient was also instructed that they will need to review over the PAT instructions again at home before surgery.

## 2018-01-06 ENCOUNTER — Ambulatory Visit (HOSPITAL_COMMUNITY)
Admission: RE | Admit: 2018-01-06 | Discharge: 2018-01-07 | Disposition: A | Discharge status: Home / Self Care | Payer: 59 | Source: Ambulatory Visit | Attending: General Surgery | Admitting: General Surgery

## 2018-01-06 ENCOUNTER — Ambulatory Visit (HOSPITAL_BASED_OUTPATIENT_CLINIC_OR_DEPARTMENT_OTHER): Payer: 59 | Admitting: Certified Registered"

## 2018-01-06 ENCOUNTER — Encounter (HOSPITAL_COMMUNITY)
Admission: RE | Admit: 2018-01-06 | Discharge: 2018-01-06 | Disposition: A | Discharge status: Home / Self Care | Payer: 59 | Source: Ambulatory Visit | Attending: General Surgery | Admitting: General Surgery

## 2018-01-06 ENCOUNTER — Other Ambulatory Visit: Payer: Self-pay

## 2018-01-06 ENCOUNTER — Encounter (HOSPITAL_BASED_OUTPATIENT_CLINIC_OR_DEPARTMENT_OTHER)
Admission: RE | Disposition: A | Discharge status: Home / Self Care | Payer: Self-pay | Source: Ambulatory Visit | Attending: General Surgery

## 2018-01-06 ENCOUNTER — Encounter (HOSPITAL_BASED_OUTPATIENT_CLINIC_OR_DEPARTMENT_OTHER): Payer: Self-pay

## 2018-01-06 DIAGNOSIS — C50912 Malignant neoplasm of unspecified site of left female breast: Secondary | ICD-10-CM

## 2018-01-06 DIAGNOSIS — Z803 Family history of malignant neoplasm of breast: Secondary | ICD-10-CM | POA: Diagnosis not present

## 2018-01-06 DIAGNOSIS — G4733 Obstructive sleep apnea (adult) (pediatric): Secondary | ICD-10-CM | POA: Diagnosis not present

## 2018-01-06 DIAGNOSIS — N6489 Other specified disorders of breast: Secondary | ICD-10-CM | POA: Diagnosis not present

## 2018-01-06 DIAGNOSIS — C50512 Malignant neoplasm of lower-outer quadrant of left female breast: Secondary | ICD-10-CM | POA: Insufficient documentation

## 2018-01-06 DIAGNOSIS — C50812 Malignant neoplasm of overlapping sites of left female breast: Secondary | ICD-10-CM | POA: Diagnosis present

## 2018-01-06 DIAGNOSIS — Z170 Estrogen receptor positive status [ER+]: Secondary | ICD-10-CM | POA: Insufficient documentation

## 2018-01-06 DIAGNOSIS — E039 Hypothyroidism, unspecified: Secondary | ICD-10-CM | POA: Diagnosis not present

## 2018-01-06 HISTORY — PX: BREAST RECONSTRUCTION WITH PLACEMENT OF TISSUE EXPANDER AND ALLODERM: SHX6805

## 2018-01-06 SURGERY — NIPPLE SPARING MASTECTOMY WITH SENTINEL LYMPH NODE BIOPSY
Anesthesia: General | Site: Breast | Laterality: Left

## 2018-01-06 MED ORDER — GABAPENTIN 100 MG PO CAPS
ORAL_CAPSULE | ORAL | Status: AC
  Filled 2018-01-06: qty 1

## 2018-01-06 MED ORDER — ONDANSETRON HCL 4 MG/2ML IJ SOLN
INTRAMUSCULAR | Status: AC
  Filled 2018-01-06: qty 2

## 2018-01-06 MED ORDER — TRIAMCINOLONE ACETONIDE 55 MCG/ACT NA AERO: 1.0000 | INHALATION_SPRAY | Freq: Every day | NASAL | Status: DC

## 2018-01-06 MED ORDER — OXYCODONE HCL 5 MG PO TABS: 5.0000 mg | ORAL_TABLET | ORAL | 0 refills | Status: DC | PRN

## 2018-01-06 MED ORDER — METHOCARBAMOL 500 MG PO TABS
500.0000 mg | ORAL_TABLET | Freq: Three times a day (TID) | ORAL | Status: DC
  Administered 2018-01-06 – 2018-01-07 (×2): 500 mg via ORAL
  Filled 2018-01-06 (×2): qty 1

## 2018-01-06 MED ORDER — TECHNETIUM TC 99M SULFUR COLLOID FILTERED: 1.0000 | Freq: Once | INTRAVENOUS | Status: DC | PRN

## 2018-01-06 MED ORDER — FENTANYL CITRATE (PF) 100 MCG/2ML IJ SOLN
50.0000 ug | Freq: Once | INTRAMUSCULAR | Status: AC
  Administered 2018-01-06: 50 ug via INTRAVENOUS

## 2018-01-06 MED ORDER — PROPOFOL 500 MG/50ML IV EMUL
INTRAVENOUS | Status: DC | PRN
  Administered 2018-01-06: 25 ug/kg/min via INTRAVENOUS

## 2018-01-06 MED ORDER — ENSURE PRE-SURGERY PO LIQD: 296.0000 mL | Freq: Once | ORAL | Status: DC

## 2018-01-06 MED ORDER — LACTATED RINGERS IV SOLN
INTRAVENOUS | Status: DC
  Administered 2018-01-06 (×2): via INTRAVENOUS

## 2018-01-06 MED ORDER — ONDANSETRON 4 MG PO TBDP: 4.0000 mg | ORAL_TABLET | Freq: Four times a day (QID) | ORAL | Status: DC | PRN

## 2018-01-06 MED ORDER — CEFAZOLIN SODIUM-DEXTROSE 2-4 GM/100ML-% IV SOLN
INTRAVENOUS | Status: AC
  Filled 2018-01-06: qty 100

## 2018-01-06 MED ORDER — FENTANYL CITRATE (PF) 100 MCG/2ML IJ SOLN
25.0000 ug | INTRAMUSCULAR | Status: DC | PRN
  Administered 2018-01-06 (×2): 50 ug via INTRAVENOUS

## 2018-01-06 MED ORDER — EPHEDRINE SULFATE 50 MG/ML IJ SOLN
INTRAMUSCULAR | Status: DC | PRN
  Administered 2018-01-06 (×2): 10 mg via INTRAVENOUS

## 2018-01-06 MED ORDER — ACETAMINOPHEN 500 MG PO TABS
ORAL_TABLET | ORAL | Status: AC
  Filled 2018-01-06: qty 2

## 2018-01-06 MED ORDER — PROPOFOL 10 MG/ML IV BOLUS
INTRAVENOUS | Status: DC | PRN
  Administered 2018-01-06: 200 mg via INTRAVENOUS

## 2018-01-06 MED ORDER — MONTELUKAST SODIUM 10 MG PO TABS: 10.0000 mg | ORAL_TABLET | Freq: Every day | ORAL | Status: DC

## 2018-01-06 MED ORDER — METHOCARBAMOL 500 MG PO TABS: 500.0000 mg | ORAL_TABLET | Freq: Three times a day (TID) | ORAL | 0 refills | Status: DC | PRN

## 2018-01-06 MED ORDER — FENTANYL CITRATE (PF) 100 MCG/2ML IJ SOLN
INTRAMUSCULAR | Status: AC
  Filled 2018-01-06: qty 2

## 2018-01-06 MED ORDER — ACETAMINOPHEN 500 MG PO TABS
1000.0000 mg | ORAL_TABLET | Freq: Four times a day (QID) | ORAL | Status: DC
  Administered 2018-01-06 – 2018-01-07 (×2): 1000 mg via ORAL
  Filled 2018-01-06 (×2): qty 2

## 2018-01-06 MED ORDER — CEFAZOLIN SODIUM-DEXTROSE 2-4 GM/100ML-% IV SOLN: 2.0000 g | INTRAVENOUS | Status: DC

## 2018-01-06 MED ORDER — SUGAMMADEX SODIUM 500 MG/5ML IV SOLN
INTRAVENOUS | Status: DC | PRN
  Administered 2018-01-06: 400 mg via INTRAVENOUS

## 2018-01-06 MED ORDER — GABAPENTIN 100 MG PO CAPS
100.0000 mg | ORAL_CAPSULE | ORAL | Status: AC
  Administered 2018-01-06: 100 mg via ORAL

## 2018-01-06 MED ORDER — SIMETHICONE 80 MG PO CHEW: 40.0000 mg | CHEWABLE_TABLET | Freq: Four times a day (QID) | ORAL | Status: DC | PRN

## 2018-01-06 MED ORDER — LIDOCAINE HCL (CARDIAC) PF 100 MG/5ML IV SOSY
PREFILLED_SYRINGE | INTRAVENOUS | Status: DC | PRN
  Administered 2018-01-06: 100 mg via INTRAVENOUS

## 2018-01-06 MED ORDER — DIPHENHYDRAMINE HCL 25 MG PO TABS: 25.0000 mg | ORAL_TABLET | Freq: Four times a day (QID) | ORAL | Status: DC | PRN

## 2018-01-06 MED ORDER — SODIUM CHLORIDE 0.9 % IV SOLN
INTRAVENOUS | Status: DC | PRN
  Administered 2018-01-06: 500 mL

## 2018-01-06 MED ORDER — ALBUTEROL SULFATE 108 (90 BASE) MCG/ACT IN AEPB: 2.0000 | INHALATION_SPRAY | Freq: Four times a day (QID) | RESPIRATORY_TRACT | Status: DC | PRN

## 2018-01-06 MED ORDER — DEXAMETHASONE SODIUM PHOSPHATE 4 MG/ML IJ SOLN
INTRAMUSCULAR | Status: DC | PRN
  Administered 2018-01-06: 10 mg via INTRAVENOUS

## 2018-01-06 MED ORDER — SULFAMETHOXAZOLE-TRIMETHOPRIM 800-160 MG PO TABS: 1.0000 | ORAL_TABLET | Freq: Two times a day (BID) | ORAL | 0 refills | Status: DC

## 2018-01-06 MED ORDER — LEVOTHYROXINE SODIUM 125 MCG PO TABS: 125.0000 ug | ORAL_TABLET | Freq: Every day | ORAL | Status: DC

## 2018-01-06 MED ORDER — PHENYLEPHRINE HCL 10 MG/ML IJ SOLN
INTRAMUSCULAR | Status: DC | PRN
  Administered 2018-01-06 (×3): 120 ug via INTRAVENOUS
  Administered 2018-01-06: 40 ug via INTRAVENOUS

## 2018-01-06 MED ORDER — LORATADINE 10 MG PO TABS: 10.0000 mg | ORAL_TABLET | Freq: Every day | ORAL | Status: DC

## 2018-01-06 MED ORDER — ONDANSETRON HCL 4 MG/2ML IJ SOLN
INTRAMUSCULAR | Status: DC | PRN
  Administered 2018-01-06: 4 mg via INTRAVENOUS

## 2018-01-06 MED ORDER — OXYCODONE HCL 5 MG PO TABS
5.0000 mg | ORAL_TABLET | ORAL | Status: DC | PRN
  Administered 2018-01-06: 10 mg via ORAL
  Filled 2018-01-06: qty 2

## 2018-01-06 MED ORDER — ALBUTEROL SULFATE (2.5 MG/3ML) 0.083% IN NEBU: 2.5000 mg | INHALATION_SOLUTION | Freq: Four times a day (QID) | RESPIRATORY_TRACT | Status: DC | PRN

## 2018-01-06 MED ORDER — MIDAZOLAM HCL 2 MG/2ML IJ SOLN
2.0000 mg | Freq: Once | INTRAMUSCULAR | Status: AC
  Administered 2018-01-06: 2 mg via INTRAVENOUS

## 2018-01-06 MED ORDER — CEFAZOLIN SODIUM-DEXTROSE 2-4 GM/100ML-% IV SOLN
2.0000 g | Freq: Three times a day (TID) | INTRAVENOUS | Status: DC
  Administered 2018-01-06 – 2018-01-07 (×2): 2 g via INTRAVENOUS
  Filled 2018-01-06: qty 100

## 2018-01-06 MED ORDER — MORPHINE SULFATE (PF) 4 MG/ML IV SOLN: 2.0000 mg | INTRAVENOUS | Status: DC | PRN

## 2018-01-06 MED ORDER — ONDANSETRON HCL 4 MG/2ML IJ SOLN
4.0000 mg | Freq: Once | INTRAMUSCULAR | Status: AC | PRN
  Administered 2018-01-06: 4 mg via INTRAVENOUS

## 2018-01-06 MED ORDER — CLONIDINE HCL (ANALGESIA) 100 MCG/ML EP SOLN
EPIDURAL | Status: DC | PRN
  Administered 2018-01-06: 50 ug

## 2018-01-06 MED ORDER — ACETAMINOPHEN 500 MG PO TABS
1000.0000 mg | ORAL_TABLET | ORAL | Status: AC
  Administered 2018-01-06: 1000 mg via ORAL

## 2018-01-06 MED ORDER — ROCURONIUM BROMIDE 100 MG/10ML IV SOLN
INTRAVENOUS | Status: DC | PRN
  Administered 2018-01-06: 40 mg via INTRAVENOUS

## 2018-01-06 MED ORDER — BUPIVACAINE-EPINEPHRINE (PF) 0.5% -1:200000 IJ SOLN
INTRAMUSCULAR | Status: DC | PRN
  Administered 2018-01-06: 25 mL

## 2018-01-06 MED ORDER — SODIUM CHLORIDE 0.9 % IV SOLN
INTRAVENOUS | Status: DC
  Administered 2018-01-06: 18:00:00 via INTRAVENOUS

## 2018-01-06 MED ORDER — ONDANSETRON HCL 4 MG/2ML IJ SOLN: 4.0000 mg | Freq: Four times a day (QID) | INTRAMUSCULAR | Status: DC | PRN

## 2018-01-06 MED ORDER — MIDAZOLAM HCL 2 MG/2ML IJ SOLN
INTRAMUSCULAR | Status: AC
  Filled 2018-01-06: qty 2

## 2018-01-06 MED ORDER — FENTANYL CITRATE (PF) 100 MCG/2ML IJ SOLN
INTRAMUSCULAR | Status: DC | PRN
  Administered 2018-01-06: 100 ug via INTRAVENOUS

## 2018-01-06 MED ORDER — SUCCINYLCHOLINE CHLORIDE 20 MG/ML IJ SOLN
INTRAMUSCULAR | Status: DC | PRN
  Administered 2018-01-06: 100 mg via INTRAVENOUS

## 2018-01-06 SURGICAL SUPPLY — 60 items
ALLOGRAFT PERF 16X20 1.6+/-0.4 (Tissue) ×1 IMPLANT
BAG DECANTER FOR FLEXI CONT (MISCELLANEOUS) ×2 IMPLANT
BINDER BREAST LRG (GAUZE/BANDAGES/DRESSINGS) IMPLANT
BINDER BREAST MEDIUM (GAUZE/BANDAGES/DRESSINGS) IMPLANT
BINDER BREAST XLRG (GAUZE/BANDAGES/DRESSINGS) ×1 IMPLANT
BINDER BREAST XXLRG (GAUZE/BANDAGES/DRESSINGS) IMPLANT
BLADE SURG 10 STRL SS (BLADE) ×2 IMPLANT
BLADE SURG 15 STRL LF DISP TIS (BLADE) ×1 IMPLANT
BLADE SURG 15 STRL SS (BLADE) ×1
BNDG GAUZE ELAST 4 BULKY (GAUZE/BANDAGES/DRESSINGS) ×2 IMPLANT
CANISTER SUCT 1200ML W/VALVE (MISCELLANEOUS) ×2 IMPLANT
CHLORAPREP W/TINT 26ML (MISCELLANEOUS) ×3 IMPLANT
COUNTER NEEDLE 1200 MAGNETIC (NEEDLE) IMPLANT
COVER MAYO STAND STRL (DRAPES) ×3 IMPLANT
DERMABOND ADVANCED (GAUZE/BANDAGES/DRESSINGS) ×1
DERMABOND ADVANCED .7 DNX12 (GAUZE/BANDAGES/DRESSINGS) ×1 IMPLANT
DRAIN CHANNEL 15F RND FF W/TCR (WOUND CARE) ×1 IMPLANT
DRAIN CHANNEL 19F RND (DRAIN) ×1 IMPLANT
DRAPE TOP ARMCOVERS (MISCELLANEOUS) ×2 IMPLANT
DRAPE U-SHAPE 76X120 STRL (DRAPES) ×2 IMPLANT
DRSG PAD ABDOMINAL 8X10 ST (GAUZE/BANDAGES/DRESSINGS) ×4 IMPLANT
DRSG TEGADERM 4X10 (GAUZE/BANDAGES/DRESSINGS) ×2 IMPLANT
ELECT BLADE 4.0 EZ CLEAN MEGAD (MISCELLANEOUS) ×2
ELECT COATED BLADE 2.86 ST (ELECTRODE) ×2 IMPLANT
ELECTRODE BLDE 4.0 EZ CLN MEGD (MISCELLANEOUS) ×1 IMPLANT
EVACUATOR SILICONE 100CC (DRAIN) ×2 IMPLANT
EXPANDER TISSUE FV FOURTE 400 (Prosthesis & Implant Plastic) IMPLANT
GLOVE BIO SURGEON STRL SZ 6 (GLOVE) ×5 IMPLANT
GLOVE BIOGEL PI IND STRL 7.0 (GLOVE) IMPLANT
GLOVE BIOGEL PI INDICATOR 7.0 (GLOVE) ×2
GLOVE ECLIPSE 6.5 STRL STRAW (GLOVE) ×2 IMPLANT
GOWN STRL REUS W/ TWL LRG LVL3 (GOWN DISPOSABLE) ×4 IMPLANT
GOWN STRL REUS W/TWL LRG LVL3 (GOWN DISPOSABLE) ×4
KIT FILL SYSTEM UNIVERSAL (SET/KITS/TRAYS/PACK) IMPLANT
LIGHT WAVEGUIDE WIDE FLAT (MISCELLANEOUS) ×1 IMPLANT
MARKER SKIN DUAL TIP RULER LAB (MISCELLANEOUS) IMPLANT
PIN SAFETY STERILE (MISCELLANEOUS) ×1 IMPLANT
PUNCH BIOPSY DERMAL 4MM (MISCELLANEOUS) IMPLANT
SHEET MEDIUM DRAPE 40X70 STRL (DRAPES) ×2 IMPLANT
SPONGE LAP 18X18 RF (DISPOSABLE) ×4 IMPLANT
STAPLER VISISTAT 35W (STAPLE) ×2 IMPLANT
SUT CHROMIC 4 0 PS 2 18 (SUTURE) ×3 IMPLANT
SUT ETHILON 2 0 FS 18 (SUTURE) ×2 IMPLANT
SUT MNCRL AB 4-0 PS2 18 (SUTURE) ×2 IMPLANT
SUT SILK 2 0 SH (SUTURE) ×1 IMPLANT
SUT VIC AB 3-0 SH 27 (SUTURE) ×1
SUT VIC AB 3-0 SH 27X BRD (SUTURE) IMPLANT
SUT VICRYL 0 CT-2 (SUTURE) ×2 IMPLANT
SUT VICRYL 4-0 PS2 18IN ABS (SUTURE) ×1 IMPLANT
SUT VLOC 180 0 24IN GS25 (SUTURE) ×1 IMPLANT
SYR 50ML LL SCALE MARK (SYRINGE) ×2 IMPLANT
SYR BULB IRRIGATION 50ML (SYRINGE) ×2 IMPLANT
TAPE MEASURE VINYL STERILE (MISCELLANEOUS) IMPLANT
TISSUE EXPNDR FV FOURTE 400 (Prosthesis & Implant Plastic) ×2 IMPLANT
TOWEL GREEN STERILE FF (TOWEL DISPOSABLE) ×3 IMPLANT
TRAY DSU PREP LF (CUSTOM PROCEDURE TRAY) ×1 IMPLANT
TRAY FOLEY W/BAG SLVR 14FR LF (SET/KITS/TRAYS/PACK) IMPLANT
TUBE CONNECTING 20X1/4 (TUBING) ×2 IMPLANT
UNDERPAD 30X30 (UNDERPADS AND DIAPERS) ×4 IMPLANT
YANKAUER SUCT BULB TIP NO VENT (SUCTIONS) ×1 IMPLANT

## 2018-01-06 NOTE — Progress Notes (Signed)
Assisted Dr. Rob Fitzgerald with left, ultrasound guided, pectoralis block. Side rails up, monitors on throughout procedure. See vital signs in flow sheet. Tolerated Procedure well. 

## 2018-01-06 NOTE — Interval H&P Note (Signed)
History and Physical Interval Note:  01/06/2018 1:57 PM  Angela [name]  has presented today for surgery, with the diagnosis of LEFT BREAST CANCER  The various methods of treatment have been discussed with the patient and family. After consideration of risks, benefits and other options for treatment, the patient has consented to  Procedure(s): LEFT NIPPLE SPARING MASTECTOMY WITH LEFT AXILLARY SENTINEL LYMPH NODE BIOPSY (Left) BREAST RECONSTRUCTION WITH PLACEMENT OF TISSUE EXPANDER AND ALLODERM (Left) as a surgical intervention .  The patient's history has been reviewed, patient examined, no change in status, stable for surgery.  I have reviewed the patient's chart and labs.  Questions were answered to the patient's satisfaction.     Rolm Bookbinder

## 2018-01-06 NOTE — Interval H&P Note (Signed)
History and Physical Interval Note:  01/06/2018 1:51 PM  Angela [name]  has presented today for surgery, with the diagnosis of LEFT BREAST CANCER  The various methods of treatment have been discussed with the patient and family. After consideration of risks, benefits and other options for treatment, the patient has consented to  Procedure(s): LEFT NIPPLE SPARING MASTECTOMY WITH LEFT AXILLARY SENTINEL LYMPH NODE BIOPSY (Left) BREAST RECONSTRUCTION WITH PLACEMENT OF TISSUE EXPANDER AND ALLODERM (Left) as a surgical intervention .  The patient's history has been reviewed, patient examined, no change in status, stable for surgery.  I have reviewed the patient's chart and labs.  Questions were answered to the patient's satisfaction.     Arnoldo Hooker Aryaman Haliburton

## 2018-01-06 NOTE — Anesthesia Procedure Notes (Signed)
Anesthesia Regional Block: Pectoralis block   Pre-Anesthetic Checklist: ,, timeout performed, Correct Patient, Correct Site, Correct Laterality, Correct Procedure, Correct Position, site marked, Risks and benefits discussed,  Surgical consent,  Pre-op evaluation,  At surgeon's request and post-op pain management  Laterality: Left  Prep: chloraprep       Needles:  Injection technique: Single-shot  Needle Type: Echogenic Needle     Needle Length: 9cm  Needle Gauge: 21     Additional Needles:   Procedures:,,,, ultrasound used (permanent image in chart),,,,  Narrative:  Start time: 01/06/2018 1:20 PM End time: 01/06/2018 1:26 PM Injection made incrementally with aspirations every 5 mL.  Performed by: Personally  Anesthesiologist: Suzette Battiest, MD

## 2018-01-06 NOTE — Anesthesia Procedure Notes (Signed)
Procedure Name: Intubation Performed by: Verita Lamb, CRNA Pre-anesthesia Checklist: Patient identified, Emergency Drugs available, Suction available, Patient being monitored and Timeout performed Patient Re-evaluated:Patient Re-evaluated prior to induction Oxygen Delivery Method: Circle system utilized Preoxygenation: Pre-oxygenation with 100% oxygen Induction Type: IV induction Ventilation: Mask ventilation without difficulty Laryngoscope Size: Mac, Glidescope and 4 Grade View: Grade I Tube type: Oral Tube size: 7.0 mm Number of attempts: 1 Airway Equipment and Method: Stylet and Rigid stylet Placement Confirmation: ETT inserted through vocal cords under direct vision,  positive ETCO2,  CO2 detector and breath sounds checked- equal and bilateral Secured at: 23 cm Tube secured with: Tape Dental Injury: Teeth and Oropharynx as per pre-operative assessment

## 2018-01-06 NOTE — Op Note (Signed)
Preoperative diagnosis: Left breast cancer, clinical stage II  Postoperative diagnosis: Same as above Procedure: 1.  Left nipple sparing mastectomy 2. Left deep axillary sentinel node biopsy Surgeon: Dr. Serita Grammes Assistant: Dr. Irene Limbo Estimated blood loss: 75 cc Specimens: 1.  Left nipple sparing mastectomy marked short superior, long lateral, double nipple areola 2.  Left nipple specimen 3. Left deep axillary nodes with highest count of 2706 Complications: None Drains: Per plastic surgery Sponge needle count was correct Disposition Case turned over to plastic surgery for reconstruction  Indications: This a 84 yof who presented to [facility] previously for er/pr pos left breast IDC.  On mri this is larger with extent of 5.5 cm with other abnormalities.  She had decided to just to letrozole for some time as she wasn't ready for surgery.  She eventually agreed to surgical therapy and elected to do left mastectomy, sn biopsy with immediate expander reconstruction.   Procedure: After informed consent was obtained the patient first underwent a pectoral block and injection of technetium on the left side. She was then placed under general anesthesia without complication.  She was given antibiotics and SCDs were in place.  She was prepped and draped in the standard sterile surgical fashion.  Surgical timeout was then performed. I made a 12 cm incision in the inframammary fold on the left side.  I carried this to the clavicle, parasternal area, and latissimus laterally posteriorly including the pectoralis fascia.   I then created the anterior flap with a combination of cautery and sharp dissection.    The breast was then removed and passed off the table.  I ensured that there was no more breast tissue adherent to the skin.  the breast was marked as above.  I then noted the sentinel nodes very low. There were what appeared to be two nodes that were normal size.  The highest count was 1363.   There were no other palpable, visible or radioactive nodes present in the axilla.  I then obtained hemostasis. I turned the case over to Dr Iran Planas for completion.

## 2018-01-06 NOTE — Anesthesia Preprocedure Evaluation (Signed)
Anesthesia Evaluation  Patient identified by MRN, date of birth, ID band Patient awake    Reviewed: Allergy & Precautions, NPO status , Patient's Chart, lab work & pertinent test results  Airway Mallampati: III  TM Distance: >3 FB Neck ROM: Full    Dental  (+) Dental Advisory Given   Pulmonary asthma , sleep apnea and Continuous Positive Airway Pressure Ventilation ,    breath sounds clear to auscultation       Cardiovascular negative cardio ROS   Rhythm:Regular Rate:Normal     Neuro/Psych  Headaches,    GI/Hepatic Neg liver ROS, GERD  ,  Endo/Other  Hypothyroidism   Renal/GU negative Renal ROS     Musculoskeletal   Abdominal   Peds  Hematology negative hematology ROS (+)   Anesthesia Other Findings   Reproductive/Obstetrics                             Lab Results  Component Value Date   WBC 6.9 01/02/2018   HGB 13.3 01/02/2018   HCT 44.4 01/02/2018   MCV 77.1 (L) 01/02/2018   PLT 325 01/02/2018    Anesthesia Physical Anesthesia Plan  ASA: II  Anesthesia Plan: General   Post-op Pain Management:  Regional for Post-op pain   Induction: Intravenous  PONV Risk Score and Plan: 3 and Ondansetron, Dexamethasone and Treatment may vary due to age or medical condition  Airway Management Planned: Oral ETT and Video Laryngoscope Planned  Additional Equipment:   Intra-op Plan:   Post-operative Plan: Extubation in OR  Informed Consent: I have reviewed the patients History and Physical, chart, labs and discussed the procedure including the risks, benefits and alternatives for the proposed anesthesia with the patient or authorized representative who has indicated his/her understanding and acceptance.   Dental advisory given  Plan Discussed with: CRNA  Anesthesia Plan Comments:         Anesthesia Quick Evaluation

## 2018-01-06 NOTE — Discharge Instructions (Signed)

## 2018-01-06 NOTE — Transfer of Care (Signed)
Immediate Anesthesia Transfer of Care Note  Patient: Angela [name]  Procedure(s) Performed: LEFT NIPPLE SPARING MASTECTOMY WITH LEFT AXILLARY SENTINEL LYMPH NODE BIOPSY (Left Breast) BREAST RECONSTRUCTION WITH PLACEMENT OF TISSUE EXPANDER AND ALLODERM (Left Breast)  Patient Location: PACU  Anesthesia Type:General  Level of Consciousness: awake, alert  and oriented  Airway & Oxygen Therapy: Patient Spontanous Breathing and Patient connected to face mask oxygen  Post-op Assessment: Report given to RN and Post -op Vital signs reviewed and stable  Post vital signs: Reviewed and stable  Last Vitals:  Vitals Value Taken Time  BP 139/101 01/06/2018  4:28 PM  Temp    Pulse 86 01/06/2018  4:30 PM  Resp 15 01/06/2018  4:30 PM  SpO2 99 % 01/06/2018  4:30 PM  Vitals shown include unvalidated device data.  Last Pain:  Vitals:   01/06/18 1254  TempSrc: Oral  PainSc: 0-No pain         Complications: No apparent anesthesia complications

## 2018-01-06 NOTE — Op Note (Signed)
Operative Note   DATE OF OPERATION: 12.10.2019  LOCATION: [facility]-observation  SURGICAL DIVISION: Plastic Surgery  PREOPERATIVE DIAGNOSES:  1. Left breast cancer overlapping sites ER+  POSTOPERATIVE DIAGNOSES:  same  PROCEDURE:  1. Left breast reconstruction with tissue expander 2. Acellular dermis (Alloderm) for breast reconstruction 300 cm2  SURGEON: Irene Limbo MD MBA  ASSISTANT: none  ANESTHESIA:  General.   EBL: 150 ml for entire procedure  COMPLICATIONS: None immediate.   INDICATIONS FOR PROCEDURE:  The patient, Angela [name], is a 62 y.o. female born on 04/09/1955, is here for immediate prepectoral expander based reconstruction following nipple sparing mastectomy.    FINDINGS: Natrelle 133S-12-T 400 ml tissue expander placed, initial fill volume 320 ml air. SN [id number]  DESCRIPTION OF PROCEDURE:  The patient was marked with the patient in the preoperative area to mark sternal notch, chest midline, anterior axillary lines and inframammary folds.The patient's operative site was prepped and draped in a sterile fashion. A time out was performed and all information was confirmed to be correct.I assisted in mastectomy and sentinel node in retraction.  Following completion of mastecomy, the cavity was irrigated with solution containing Ancef, gentamicin, and bacitracin. Hemostasis was ensured. A 19 Fr drain was placed in subcutaneous position laterally and a 15 Fr drain placed along inframammary fold. Each secured to skin with 2-0 nylon. Cavity irrigated with Betadine. The tissue expander was prepared on back table prior in insertion. The expander was filled with air to358ml. Perforated acellular dermis wasdraped over anterior surface expander. The ADM was then secured to itself over posterior surface of expander with 4-0 chromic. Redundant folds acellular dermis excised so that the ADM layflat without folds over air filled expander.The expander was secured  tofascia over lateral sternal borderwith a 0 vicryl. Thelateral tab wasalso secured to pectoralis muscle with 0-vicryl. The ADM was secured to pectoralis muscle and chest wall along inferior border at inframammary foldwith 0 V-lock suture.Laterally the mastectomy flap over posterior axillary line was advanced anteriorly and the subcutaneous tissue and superficial fascia was secured to pectoralis muscle and acellular dermis with 0-vicryl. Skin closure completedwith 3-0 vicryl in fascial layer and 4-0 vicryl in dermis. Skin closure completed with 4-0 monocryl subcuticular and tissue adhesive. Small perforation of nipple incurred during mastectomy dissection and this was repaired primarily with interrupted 4-0 monocryl.  The mastectomy flap was redraped so that NAC was symmetric from midline and sternal notch with right breast. Tegaderm dressings applied followed bydry dressing,breast binder.  The patient was allowed to wake from anesthesia, extubated and taken to the recovery room in satisfactory condition.   SPECIMENS: none  DRAINS:  15 and 19 Fr JP in left breast reconstruction  Irene Limbo, MD [facility] Plastic & Reconstructive Surgery [phone], pin [phone]

## 2018-01-06 NOTE — Progress Notes (Signed)
Nuc med injection performed by nuc med staff.  VS stable. No additional sedation required.  Emotional support provided. Family to bedside

## 2018-01-06 NOTE — H&P (Signed)
Angela [name] is an 62 y.o. female.   Chief Complaint: breast cancer HPI:  62 year old female who presents with breast cancer. 2 yof I saw in [place] previously with left breast cancer. she has history of nipple biopsy for itching in 2012 by one of my partners. she does have some spontaneous tan dc on left. this has been there for years. she has fh in maternal aunt in her 52s. she had screening mm that shows c density breasts. there is a left breast asymmetry. this is posterior at 6 oclock. no findings on [place]. has no ax [place]. she underwent biopsy with no clip being placed due to movement. not sure if area sampled was the asymmetry for sure. 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 radiology recommended mri as they wanted some clarification on site and biopsy. The mri was done and shows: IMPRESSION: 1. Biopsy-proven carcinoma within the lower outer quadrant of the LEFT breast, at posterior depth, extending to the midline, dominant component measuring 4.4 cm greatest dimension, but with nearly contiguous non-mass enhancement posteriorly increasing the overall AP dimension to 5.5 cm. 2. Additional clumped non mass enhancement within the lower outer quadrant of the LEFT breast, 2 sites, at middle depth and anterior depth respectively, both suspicious for additional multifocal disease. If the non mass enhancement at anterior depth is positive for additional disease, this would increase the overall extent to over 8 cm within the outer LEFT breast. 3. No evidence of malignancy within the RIGHT breast. 4. At least 5 morphologically abnormal lymph nodes in the LEFT axilla. 5. Two mildly prominent lymph nodes in the RIGHT axilla, perhaps slightly increased in size compared to the earlier MRI but not convincingly changed suggesting benignity  The axillary [place] on right was normal. axillary [place] on left side showed a possible abnl node measuring 1 cm.  this underwent biopsy and was benign fatty breast tissue with no node present. mr biopsy on left of anterior and posterior extent were high grade dcis.  she decided to do letrozole to see if she could shrink this. I told her I thought would likely still need mastectomy. she is tolerating letrozole but had some left axillary pain recently. she has undergone left mm that shows at least a 3.1 cm breast cancer with another nodule in lower outer quadrant, there is no bilateral axillary adenopathy now.   Past Medical History:  Diagnosis Date  . Anxiety   . Asthma   . Breast cancer ([place]) 07/2017   Left Breast Cancer  . Breast discharge   . Congestion of right ear   . Depression   . GERD (gastroesophageal reflux disease)   . Headache(784.0)   . Hyperlipidemia   . Hypothyroidism   . Seizures ([place])    hx of during childhood -not since age 44  . Sleep apnea    uses CPAP  . Thyroid disease   . Tinnitus   . Urticaria     Past Surgical History:  Procedure Laterality Date  . ANTERIOR AND POSTERIOR REPAIR  2007  . BREAST DUCTAL SYSTEM EXCISION  01/18/2011   Procedure: EXCISION DUCTAL SYSTEM BREAST;  Surgeon: Belva Crome, MD;  Location: [facility];  Service: General;  Laterality: Right;  . BREAST EXCISIONAL BIOPSY Right   . BREAST SURGERY  01/18/2011   Right BX  . CYSTOSCOPY WITH URETHROLYSIS N/A 11/01/2014   Procedure: CYSTOSCOPY WITH URETHROLYSIS;  Surgeon: Bjorn Loser, MD;  Location: [facility];  Service: Urology;  Laterality: N/A;  . DILATION AND CURETTAGE OF UTERUS    . PUBOVAGINAL SLING N/A 11/01/2014   Procedure: REMOVAL VAGINAL SLING AND URETHROLYSIS AND CYSTO;  Surgeon: Bjorn Loser, MD;  Location: [facility];  Service: Urology;  Laterality: N/A;  . TUBAL LIGATION  1998  . TYMPANOSTOMY TUBE PLACEMENT  2010   right ear   . WISDOM TOOTH EXTRACTION      Family History  Problem Relation Age of Onset  . Cancer Father        throat  . Cancer Maternal Aunt         breast  . Breast cancer Maternal Aunt        in 3's  . Allergic rhinitis Neg Hx   . Asthma Neg Hx   . Eczema Neg Hx   . Immunodeficiency Neg Hx   . Urticaria Neg Hx    Social History:  reports that she has never smoked. She has never used smokeless tobacco. She reports that she does not drink alcohol or use drugs.  Allergies:  Allergies  Allergen Reactions  . Other Other (See Comments)    NO BLOOD PRODUCTS - PT IN AGREEMENT FOR ALBUMIN OR ALBUMIN CONTAINING PRODUCTS PER CONSENT  . Aspirin Other (See Comments)    Stomach ulcers   . Ibuprofen Other (See Comments)    Cautious by MD due to kidney function     No medications prior to admission.    No results found for this or any previous visit (from the past 48 hour(s)). No results found.  ROS Negative  There were no vitals taken for this visit. Physical Exam  Vitals Emeline Gins CMA; 12/17/2017 11:59 AM) 12/17/2017 11:59 AM Weight: 23.25 lb Height: 65in Body Surface Area: 0.79 m Body Mass Index: 3.87 kg/m  Temp.: 97.29F  Pulse: 96 (Regular)  BP: 134/80 (Sitting, Left Arm, Standard) Physical Exam Rolm Bookbinder MD; 12/17/2017 1:56 PM) General Mental Status-Alert. Breast Nipples-No Discharge. Breast Lump-No Palpable Breast Mass. Neurologic Neurologic evaluation reveals -alert and oriented x 3 with no impairment of recent or remote memory. Lymphatic Head & Neck General Head & Neck Lymphatics: Bilateral - Description - Normal. Axillary General Axillary Region: Bilateral - Description - Normal. Note: no [place] adenopathy  Assessment/Plan BREAST CANCER OF LOWER-OUTER QUADRANT OF LEFT FEMALE BREAST (C50.512) Story: Left nsm, left ax sn biopsy We discussed a sentinel lymph node biopsy as she does not appear to having lymph node involvement right now. We discussed the performance of that with injection of radioactive tracer. . We discussed that there is a chance of having a positive node with  a sentinel lymph node biopsy and we will await the permanent pathology to make any other first further decisions in terms of her treatment. One of these options might be to return to the operating room to perform an axillary lymph node dissection. We discussed up to a 5% risk lifetime of chronic shoulder pain as well as lymphedema associated with a sentinel lymph node biopsy. I think she needs mastectomy due to disease. I think oncologically she is candidate for nsm and this is agreeable by plastic surgery. discussed nipple loss either partial or total, loss of nac sensation, discussed pos nipple requiring excision. will schedule with plastic surgery soon   Rolm Bookbinder, MD 01/06/2018, 7:13 AM

## 2018-01-07 ENCOUNTER — Encounter (HOSPITAL_BASED_OUTPATIENT_CLINIC_OR_DEPARTMENT_OTHER): Payer: Self-pay | Admitting: General Surgery

## 2018-01-07 DIAGNOSIS — C50512 Malignant neoplasm of lower-outer quadrant of left female breast: Secondary | ICD-10-CM | POA: Diagnosis not present

## 2018-01-07 MED ORDER — MIDAZOLAM HCL 2 MG/2ML IJ SOLN
INTRAMUSCULAR | Status: AC
  Filled 2018-01-07: qty 2

## 2018-01-07 MED ORDER — FENTANYL CITRATE (PF) 100 MCG/2ML IJ SOLN
INTRAMUSCULAR | Status: AC
  Filled 2018-01-07: qty 2

## 2018-01-07 NOTE — Discharge Summary (Signed)
Physician Discharge Summary  Patient ID: Angela [name] MRN: [id number] DOB/AGE: 08-28-1955 62 y.o.  Admit date: 01/06/2018 Discharge date: 01/07/2018  Admission Diagnoses: Left breast cancer  Discharge Diagnoses:  Active Problems:   Breast cancer, left [facility])   Discharged Condition: stable  Hospital Course: Post operatively patient did will with controlled pain, tolerating diet, ambulatory with minimal assist. Instructed on drain care and bathing.  Treatments: surgery: left nipple sparing mastectomy SLN left breast reconstruction with tissue expander acellular dermis 12.10.2019  Discharge Exam: Blood pressure 119/83, pulse 77, temperature (!) 97 F (36.1 C), resp. rate 18, height 5\' 5"  (1.651 m), weight 105.8 kg, SpO2 99 %. Incision/Wound: chest soft Tegaderms in place developing ecchymoses incision dry drain serosanguinous  Disposition: Discharge disposition: 01-Home or Self Care       Discharge Instructions    Call MD for:  redness, tenderness, or signs of infection (pain, swelling, bleeding, redness, odor or green/yellow discharge around incision site)   Complete by:  As directed    Call MD for:  temperature >100.5   Complete by:  As directed    Discharge instructions   Complete by:  As directed    Ok to remove dressings and shower am 12.12.19. Soap and water ok, pat Tegaderms dry. Do not remove Tegaderms. No creams or ointments over incisions. Do not let drains dangle in shower, attach to lanyard or similar.Strip and record drains twice daily and bring log to clinic visit.  Breast binder or soft compression bra all other times.  Ok to raise arms above shoulders for bathing and dressing.  No house yard work or exercise until cleared by MD.   Recommend ibuprofen as directed with meals to aid with pain control. Ok to use Tylenol as needed for pain. Recommend Miralax or Dulcolax as needed for constipation.   Driving Restrictions   Complete by:  As directed    No  driving for 2 weeks, then no driving if taking narcotics   Lifting restrictions   Complete by:  As directed    No lifting > 5 lbs until cleared by MD   Resume previous diet   Complete by:  As directed       Follow-up Information    Rolm Bookbinder, MD In 2 weeks.   Specialty:  General Surgery Contact information: [address]  [place] [address] [phone]        Irene Limbo, MD In 1 week.   Specialty:  Plastic Surgery Why:  as scheduled Contact information: [place] [address] [phone]           Signed: Irene Limbo 01/07/2018, 7:29 AM

## 2018-01-07 NOTE — Anesthesia Postprocedure Evaluation (Signed)
Anesthesia Post Note  Patient: Angela [name]  Procedure(s) Performed: LEFT NIPPLE SPARING MASTECTOMY WITH LEFT AXILLARY SENTINEL LYMPH NODE BIOPSY (Left Breast) BREAST RECONSTRUCTION WITH PLACEMENT OF TISSUE EXPANDER AND ALLODERM (Left Breast)     Patient location during evaluation: PACU Anesthesia Type: General Level of consciousness: awake and alert Pain management: pain level controlled Vital Signs Assessment: post-procedure vital signs reviewed and stable Respiratory status: spontaneous breathing, nonlabored ventilation, respiratory function stable and patient connected to nasal cannula oxygen Cardiovascular status: blood pressure returned to baseline and stable Postop Assessment: no apparent nausea or vomiting Anesthetic complications: no    Last Vitals:  Vitals:   01/07/18 0630 01/07/18 0730  BP:  130/88  Pulse:  73  Resp: 18 18  Temp:  37.1 C  SpO2: 99% 99%    Last Pain:  Vitals:   01/07/18 0730  TempSrc:   PainSc: 0-No pain                 Tiajuana Amass

## 2018-01-08 NOTE — Addendum Note (Signed)
Addendum  created 01/08/18 1308 by Tawni Millers, CRNA   Charge Capture section accepted

## 2018-01-13 ENCOUNTER — Telehealth: Payer: Self-pay | Admitting: *Deleted

## 2018-01-13 ENCOUNTER — Ambulatory Visit: Payer: Managed Care, Other (non HMO) | Admitting: Hematology and Oncology

## 2018-01-13 DIAGNOSIS — Z9012 Acquired absence of left breast and nipple: Secondary | ICD-10-CM | POA: Insufficient documentation

## 2018-01-13 NOTE — Telephone Encounter (Signed)
Received order for oncotype testing. Requisition faxed to pathology. Received by Keisha 

## 2018-01-27 NOTE — Progress Notes (Signed)
Patient Care Team: Alroy Dust, L.[name], MD as PCP - General (Family Medicine) Rolm Bookbinder, MD as Consulting Physician (General Surgery) Nicholas Lose, MD as Consulting Physician (Hematology and Oncology) Gery Pray, MD as Consulting Physician (Radiation Oncology)  DIAGNOSIS:    ICD-10-CM   1. Malignant neoplasm of lower-inner quadrant of left breast in female, estrogen receptor positive ([place]) C50.312    Z17.0     SUMMARY OF ONCOLOGIC HISTORY:   Malignant neoplasm of lower-inner quadrant of left breast in female, estrogen receptor positive ([place])   07/14/2017 Initial Diagnosis    Screening detected left breast asymmetry at 6 o'clock position, could not be measured accurately.  Axillary ultrasound was not performed.  Biopsy of the asymmetry revealed IDC grade 1, ER 95%, PR 60%, Ki-67 2%, HER-2 negative ratio 1.52, T1 NX stage Ia probable clinical stage    07/30/2017 Breast MRI    Biopsy-proven left breast 4.4 cm with non-mass enhancement overall extent 5.5 cm.  Additional clumped non-mass enhancement left breast 2 sites middle depth anterior depth suspicious for multifocal disease overall extent 8 cm, 5 morphologically abnormal lymph nodes in the left axilla 2 prominent lymph nodes in the right axilla    08/15/2017 Procedure    Left breast biopsy anterior anterior and lower outer quadrants DCIS high-grade; left axillary lymph node biopsy benign fatty tissue no lymph node tissue present    08/27/2017 -  Anti-estrogen oral therapy    Letrozole 2.5 mg daily    01/06/2018 Surgery    Left mastectomy with reconstruction on 01/06/2018: IDC, 3 cm, margins negative, 0/1 lymph node negative, grade 2, ER 100%, PR 0%, Ki-67 3%     CHIEF COMPLIANT: Neoadjuvant letrozole therapy  INTERVAL HISTORY: Angela [name] is a 62 y.o. with above-mentioned history of left breast cancer who underwent neoadjuvant letrozole therapy and a mastectomy of the left breast on 01/06/18. She presents to the clinic  today following surgery, for which pathology confirmed the 3cm cancer was grade 2, HER 2 negative, ER 100%, PR negative, Ki67 3% and the lymph node was negative. She presents to the clinic today alone. Her oncotype results show a score of 12 and 3% chance of recurrence in 9 years. She reports when she was previously on letrozole her side effects of nausea and hot flashes subsided over time. She notes she will extend her time off from work longer than 4 weeks due to pain from surgery that is still present. She reports slight depression in the past week and will see a counselor if needed.   REVIEW OF SYSTEMS:   Constitutional: Denies fevers, chills or abnormal weight loss Eyes: Denies blurriness of vision Ears, nose, mouth, throat, and face: Denies mucositis or sore throat Respiratory: Denies cough, dyspnea or wheezes Cardiovascular: Denies palpitation, chest discomfort Gastrointestinal:  Denies nausea, heartburn or change in bowel habits Skin: Denies abnormal skin rashes Lymphatics: Denies new lymphadenopathy or easy bruising Neurological:Denies numbness, tingling or new weaknesses Behavioral/Psych: Mood is stable, no new changes  Extremities: No lower extremity edema Breast: denies any pain or lumps or nodules in either breasts All other systems were reviewed with the patient and are negative.  I have reviewed the past medical history, past surgical history, social history and family history with the patient and they are unchanged from previous note.  ALLERGIES:  is allergic to other; aspirin; and ibuprofen.  MEDICATIONS:  Current Outpatient Medications  Medication Sig Dispense Refill  . albuterol (PROVENTIL) (2.5 MG/3ML) 0.083% nebulizer solution Take 2.5 mg  by nebulization every 6 (six) hours as needed for wheezing or shortness of breath.    . Albuterol Sulfate (PROAIR RESPICLICK) 947 (90 Base) MCG/ACT AEPB Inhale 2 puffs into the lungs every 4 (four) hours as needed. (Patient taking  differently: Inhale 2 puffs into the lungs every 6 (six) hours as needed (wheezing/shortness of breath). ) 1 each 1  . diphenhydrAMINE (BENADRYL) 25 MG tablet Take 25 mg by mouth every 6 (six) hours as needed for itching or allergies.    Marland Kitchen letrozole (FEMARA) 2.5 MG tablet Take 1 tablet (2.5 mg total) by mouth daily. 30 tablet 6  . levothyroxine (SYNTHROID, LEVOTHROID) 112 MCG tablet Take 1 tablet (112 mcg total) by mouth daily. (Patient not taking: Reported on 12/30/2017) 4 tablet 0  . levothyroxine (SYNTHROID, LEVOTHROID) 125 MCG tablet Take 125 mcg by mouth daily before breakfast.  3  . loratadine (CLARITIN) 10 MG tablet Take 10 mg by mouth daily.    . methocarbamol (ROBAXIN) 500 MG tablet Take 1 tablet (500 mg total) by mouth every 8 (eight) hours as needed for muscle spasms. 30 tablet 0  . montelukast (SINGULAIR) 10 MG tablet Take 10 mg by mouth at bedtime.    . Multiple Vitamin (MULTIVITAMIN) tablet Take 1 tablet by mouth daily.    Marland Kitchen oxyCODONE (ROXICODONE) 5 MG immediate release tablet Take 1 tablet (5 mg total) by mouth every 4 (four) hours as needed. 35 tablet 0  . sulfamethoxazole-trimethoprim (BACTRIM DS,SEPTRA DS) 800-160 MG tablet Take 1 tablet by mouth 2 (two) times daily. 12 tablet 0  . triamcinolone (NASACORT AQ) 55 MCG/ACT AERO nasal inhaler Place 1 spray into the nose at bedtime.      No current facility-administered medications for this visit.     PHYSICAL EXAMINATION: ECOG PERFORMANCE STATUS: 1 - Symptomatic but completely ambulatory  Vitals:   01/29/18 1000  BP: (!) 120/95  Pulse: 70  Resp: 17  Temp: 98.4 F (36.9 C)  SpO2: 100%   Filed Weights   01/29/18 1000  Weight: 235 lb 8 oz (106.8 kg)    GENERAL:alert, no distress and comfortable SKIN: skin color, texture, turgor are normal, no rashes or significant lesions EYES: normal, Conjunctiva are pink and non-injected, sclera clear OROPHARYNX:no exudate, no erythema and lips, buccal mucosa, and tongue normal  NECK:  supple, thyroid normal size, non-tender, without nodularity LYMPH:  no palpable lymphadenopathy in the cervical, axillary or inguinal LUNGS: clear to auscultation and percussion with normal breathing effort HEART: regular rate & rhythm and no murmurs and no lower extremity edema ABDOMEN:abdomen soft, non-tender and normal bowel sounds MUSCULOSKELETAL:no cyanosis of digits and no clubbing  NEURO: alert & oriented x 3 with fluent speech, no focal motor/sensory deficits EXTREMITIES: No lower extremity edema  LABORATORY DATA:  I have reviewed the data as listed CMP Latest Ref Rng & Units 07/23/2017 08/11/2016 08/10/2016  Glucose 70 - 99 mg/dL 95 137(H) 115(H)  BUN 6 - 20 mg/dL '14 12 11  '$ Creatinine 0.44 - 1.00 mg/dL 0.94 0.90 0.88  Sodium 135 - 145 mmol/L 144 139 143  Potassium 3.5 - 5.1 mmol/L 4.0 4.6 3.4(L)  Chloride 98 - 111 mmol/L 109 107 109  CO2 22 - 32 mmol/L '24 23 26  '$ Calcium 8.9 - 10.3 mg/dL 9.6 9.2 9.1  Total Protein 6.5 - 8.1 g/dL 7.7 - -  Total Bilirubin 0.3 - 1.2 mg/dL 0.2(L) - -  Alkaline Phos 38 - 126 U/L 79 - -  AST 15 - 41 U/L 11(L) - -  ALT 0 - 44 U/L 12 - -    Lab Results  Component Value Date   WBC 6.9 01/02/2018   HGB 13.3 01/02/2018   HCT 44.4 01/02/2018   MCV 77.1 (L) 01/02/2018   PLT 325 01/02/2018   NEUTROABS 4.3 07/23/2017    ASSESSMENT & PLAN:  Malignant neoplasm of lower-inner quadrant of left breast in female, estrogen receptor positive ([place]) 07/14/2017:Screening detected left breast asymmetry at 6 o'clock position, could not be measured accurately. Axillary ultrasound was not performed. Biopsy of the asymmetry revealed IDC grade 1, ER 95%, PR 60%, Ki-67 2%, HER-2 negative ratio 1.52, T1 NX stage Ia probable clinical stage  07/30/2017: Breast JNW:[id number] left breast 4.4 cm with non-mass enhancement overall extent 5.5 cm. Additional clumped non-mass enhancement left breast 2 sites middle depth anterior depth suspicious for multifocal disease overall  extent 8 cm, 5 morphologically abnormal lymph nodes in the left axilla 2 prominent lymph nodes in the right axilla  Additional biopsies left breast showed DCIS Left axillary lymph node biopsy came back benign although there was no lymphoid tissue. Oncotype DX score 12: 3% risk of distant recurrence of 9 years, no chemo benefit  Current treatment: Adjuvant antiestrogen therapy with letrozole (letrozole was started neoadjuvantly in July 2019) Left mastectomy with reconstruction on 01/06/2018: IDC, 3 cm, margins negative, 0/1 lymph node negative, grade 2, ER 100%, PR 0%, Ki-67 3%  Pathology counseling: I discussed the final pathology report of the patient provided  a copy of this report. I discussed the margins as well as lymph node surgeries. We also discussed the final staging along with previously performed ER/PR and HER-2/neu testing.  Recommendation: Continue with current antiestrogen treatment plan for 7 years. Return to clinic in 6 months for survivorship care plan visit     No orders of the defined types were placed in this encounter.  The patient has a good understanding of the overall plan. she agrees with it. she will call with any problems that may develop before the next visit here.  Nicholas Lose, MD 01/29/2018   I, Cloyde Reams Dorshimer, am acting as scribe for Nicholas Lose, MD.  I have reviewed the above documentation for accuracy and completeness, and I agree with the above.

## 2018-01-29 ENCOUNTER — Telehealth: Payer: Self-pay | Admitting: Adult Health

## 2018-01-29 ENCOUNTER — Inpatient Hospital Stay: Payer: 59 | Attending: Hematology and Oncology | Admitting: Hematology and Oncology

## 2018-01-29 DIAGNOSIS — Z79811 Long term (current) use of aromatase inhibitors: Secondary | ICD-10-CM

## 2018-01-29 DIAGNOSIS — Z79899 Other long term (current) drug therapy: Secondary | ICD-10-CM | POA: Diagnosis not present

## 2018-01-29 DIAGNOSIS — Z9012 Acquired absence of left breast and nipple: Secondary | ICD-10-CM

## 2018-01-29 DIAGNOSIS — C50312 Malignant neoplasm of lower-inner quadrant of left female breast: Secondary | ICD-10-CM | POA: Diagnosis not present

## 2018-01-29 DIAGNOSIS — Z170 Estrogen receptor positive status [ER+]: Secondary | ICD-10-CM | POA: Diagnosis not present

## 2018-01-29 NOTE — Assessment & Plan Note (Signed)
07/14/2017:Screening detected left breast asymmetry at 6 o'clock position, could not be measured accurately. Axillary ultrasound was not performed. Biopsy of the asymmetry revealed IDC grade 1, ER 95%, PR 60%, Ki-67 2%, HER-2 negative ratio 1.52, T1 NX stage Ia probable clinical stage  07/30/2017: Breast VFI:EPPIRJ-JOACZY left breast 4.4 cm with non-mass enhancement overall extent 5.5 cm. Additional clumped non-mass enhancement left breast 2 sites middle depth anterior depth suspicious for multifocal disease overall extent 8 cm, 5 morphologically abnormal lymph nodes in the left axilla 2 prominent lymph nodes in the right axilla  Additional biopsies left breast showed DCIS Left axillary lymph node biopsy came back benign although there was no lymphoid tissue.  Current treatment: Neoadjuvant antiestrogen therapy with letrozole started 08/26/2017 Left mastectomy with reconstruction on 01/06/2018: IDC, 3 cm, margins negative, 0/1 lymph node negative, grade 2, ER 100%, PR 0%, Ki-67 3%  Pathology counseling: I discussed the final pathology report of the patient provided  a copy of this report. I discussed the margins as well as lymph node surgeries. We also discussed the final staging along with previously performed ER/PR and HER-2/neu testing.  Recommendation: Continue with current antiestrogen treatment plan for 7 years. Return to clinic in 6 months for survivorship care plan visit

## 2018-01-29 NOTE — Telephone Encounter (Signed)
Gave avs and calendar ° °

## 2018-01-30 ENCOUNTER — Encounter: Payer: Self-pay | Admitting: *Deleted

## 2018-01-30 ENCOUNTER — Other Ambulatory Visit: Payer: Self-pay

## 2018-01-30 ENCOUNTER — Encounter: Payer: Self-pay | Admitting: Physical Therapy

## 2018-01-30 ENCOUNTER — Encounter (HOSPITAL_COMMUNITY): Payer: Self-pay | Admitting: Hematology and Oncology

## 2018-01-30 ENCOUNTER — Ambulatory Visit: Payer: 59 | Attending: Plastic Surgery | Admitting: Physical Therapy

## 2018-01-30 DIAGNOSIS — R293 Abnormal posture: Secondary | ICD-10-CM | POA: Diagnosis present

## 2018-01-30 DIAGNOSIS — M25512 Pain in left shoulder: Secondary | ICD-10-CM | POA: Diagnosis present

## 2018-01-30 DIAGNOSIS — M6281 Muscle weakness (generalized): Secondary | ICD-10-CM

## 2018-01-30 DIAGNOSIS — M25612 Stiffness of left shoulder, not elsewhere classified: Secondary | ICD-10-CM | POA: Insufficient documentation

## 2018-01-30 DIAGNOSIS — C50312 Malignant neoplasm of lower-inner quadrant of left female breast: Secondary | ICD-10-CM | POA: Diagnosis present

## 2018-01-30 DIAGNOSIS — Z170 Estrogen receptor positive status [ER+]: Secondary | ICD-10-CM | POA: Diagnosis present

## 2018-01-30 NOTE — Therapy (Signed)
[place], [place], [address] Phone: [phone]   Fax:  [phone]  Physical Therapy Evaluation  Patient Details  Name: Angela [name] MRN: [id number] Date of Birth: Jun 21, 1955 Referring Provider (PT): Dr. Iran Planas   Encounter Date: 01/30/2018  PT End of Session - 01/30/18 1134    Visit Number  1    Number of Visits  9    Date for PT Re-Evaluation  02/27/18    Authorization - Visit Number  1    Authorization - Number of Visits  30    PT Start Time  1007    PT Stop Time  1050    PT Time Calculation (min)  43 min    Activity Tolerance  Patient tolerated treatment well    Behavior During Therapy  [facility] for tasks assessed/performed       Past Medical History:  Diagnosis Date  . Anxiety   . Asthma   . Breast cancer ([place]) 07/2017   Left Breast Cancer  . Breast discharge   . Congestion of right ear   . Depression   . GERD (gastroesophageal reflux disease)   . Headache(784.0)   . Hyperlipidemia   . Hypothyroidism   . Seizures ([place])    hx of during childhood -not since age 34  . Sleep apnea    uses CPAP  . Thyroid disease   . Tinnitus   . Urticaria     Past Surgical History:  Procedure Laterality Date  . ANTERIOR AND POSTERIOR REPAIR  2007  . BREAST DUCTAL SYSTEM EXCISION  01/18/2011   Procedure: EXCISION DUCTAL SYSTEM BREAST;  Surgeon: Belva Crome, MD;  Location: [facility];  Service: General;  Laterality: Right;  . BREAST EXCISIONAL BIOPSY Right   . BREAST RECONSTRUCTION WITH PLACEMENT OF TISSUE EXPANDER AND ALLODERM Left 01/06/2018   Procedure: BREAST RECONSTRUCTION WITH PLACEMENT OF TISSUE EXPANDER AND ALLODERM;  Surgeon: Irene Limbo, MD;  Location: [facility];  Service: Plastics;  Laterality: Left;  . BREAST SURGERY  01/18/2011   Right BX  . CYSTOSCOPY WITH URETHROLYSIS N/A 11/01/2014   Procedure: CYSTOSCOPY WITH URETHROLYSIS;  Surgeon: Bjorn Loser, MD;  Location: [facility];  Service: Urology;  Laterality: N/A;  . DILATION AND CURETTAGE OF UTERUS    . PUBOVAGINAL SLING N/A 11/01/2014   Procedure: REMOVAL VAGINAL SLING AND URETHROLYSIS AND CYSTO;  Surgeon: Bjorn Loser, MD;  Location: [facility];  Service: Urology;  Laterality: N/A;  . TUBAL LIGATION  1998  . TYMPANOSTOMY TUBE PLACEMENT  2010   right ear   . WISDOM TOOTH EXTRACTION      There were no vitals filed for this visit.   Subjective Assessment - 01/30/18 1015    Subjective  I had a left mastectomy on Dec 10. I am having tightness and now I am having a burning sensation in my side where it used to be numb. I have trouble with ROM and washing my back when I shower.     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 It is an area of distortion that is ER/PR positive and HER2 negative with a Ki67 of 2%., 01/06/18- left mastectomy, SLNB (2 nodes both negative), pt does not require chemo or radiation    Patient Stated Goals  to get ROM back and eliminate some of the pain    Currently in Pain?  Yes  Pain Score  6     Pain Location  --   trunk   Pain Orientation  Left;Lateral    Pain Descriptors / Indicators  Sore;Burning    Pain Type  Surgical pain    Pain Onset  1 to 4 weeks ago    Pain Frequency  Constant    Aggravating Factors   sleeping on left side, sudden movements    Pain Relieving Factors  pain meds    Effect of Pain on Daily Activities  hard to lift arm or move in certain directions         [facility] PT Assessment - 01/30/18 0001      Assessment   Medical Diagnosis                                                          Referring Provider (PT)  Dr. Iran Planas    Onset Date/Surgical Date  01/06/18    Hand Dominance  Right    Prior Therapy  none      Precautions   Precautions  Other (comment)    Precaution Comments  at risk for lymphedema      Restrictions   Weight Bearing Restrictions   No      Balance Screen   Has the patient fallen in the past 6 months  No    Has the patient had a decrease in activity level because of a fear of falling?   No    Is the patient reluctant to leave their home because of a fear of falling?   No      Home Environment   Living Environment  Private residence    Living Arrangements  Alone    Available Help at Discharge  Family    Type of [place]      Prior Function   Level of [place]  Full time employment   currently on [place] [employer]  Works at call center for [employer]  prior to surgery she walked 3-4x/week for 45 minutes      Cognition   Overall Cognitive Status  Within Functional Limits for tasks assessed      Posture/Postural Control   Posture/Postural Control  Postural limitations    Postural Limitations  Rounded Shoulders;Forward head      AROM   Right Shoulder Extension  --    Right Shoulder Flexion  163 Degrees    Right Shoulder ABduction  166 Degrees    Right Shoulder Internal Rotation  53 Degrees    Right Shoulder External Rotation  90 Degrees    Left Shoulder Extension  --    Left Shoulder Flexion  142 Degrees    Left Shoulder ABduction  126 Degrees    Left Shoulder Internal Rotation  19 Degrees    Left Shoulder External Rotation  90 Degrees    Cervical Flexion  --    Cervical Extension  --    Cervical - Right Side Bend  --    Cervical - Left Side Bend  --    Cervical - Right Rotation  --    Cervical - Left Rotation  --      Strength   Overall Strength  --  LYMPHEDEMA/ONCOLOGY QUESTIONNAIRE - 01/30/18 1026      Type   Cancer Type  Left breast cancer      Surgeries   Mastectomy Date  01/06/18    Sentinel Lymph Node Biopsy Date  01/06/18    Number Lymph Nodes Removed  2      Date Lymphedema/Swelling Started   Date  01/06/18      Treatment   Active Chemotherapy Treatment  No    Past Chemotherapy Treatment  No    Active Radiation Treatment   No    Past Radiation Treatment  No    Current Hormone Treatment  Yes      What other symptoms do you have   Are you Having Heaviness or Tightness  Yes    Are you having Pain  Yes    Are you having pitting edema  No    Is it Hard or Difficult finding clothes that fit  No    Do you have infections  No    Is there Decreased scar mobility  Yes      Lymphedema Assessments   Lymphedema Assessments  Upper extremities      Right Upper Extremity Lymphedema   10 cm Proximal to Olecranon Process  35 cm    Olecranon Process  28 cm    10 cm Proximal to Ulnar Styloid Process  25.2 cm    Just Proximal to Ulnar Styloid Process  18 cm    Across Hand at [employer]  22 cm    At [place] of 2nd Digit  7.1 cm      Left Upper Extremity Lymphedema   10 cm Proximal to Olecranon Process  33.5 cm    Olecranon Process  27 cm    10 cm Proximal to Ulnar Styloid Process  24.3 cm    Just Proximal to Ulnar Styloid Process  18 cm    Across Hand at [employer]  22 cm    At [place] of 2nd Digit  7.2 cm          Quick Dash - 01/30/18 0001    Open a tight or new jar  Mild difficulty    Do heavy household chores (wash walls, wash floors)  Moderate difficulty    Carry a shopping bag or briefcase  Mild difficulty    Wash your back  Moderate difficulty    Use a knife to cut food  No difficulty    Recreational activities in which you take some force or impact through your arm, shoulder, or hand (golf, hammering, tennis)  Severe difficulty    During the past week, to what extent has your arm, shoulder or hand problem interfered with your normal social activities with family, friends, neighbors, or groups?  Modererately    During the past week, to what extent has your arm, shoulder or hand problem limited your work or other regular daily activities  Modererately    Arm, shoulder, or hand pain.  Moderate    Tingling (pins and needles) in your arm, shoulder, or hand  Moderate    Difficulty Sleeping  Severe  difficulty    DASH Score  45.45 %        Objective measurements completed on examination: See above findings.      [facility] Adult PT Treatment/Exercise - 01/30/18 0001      Exercises   Exercises  Shoulder      Shoulder Exercises: Supine   Flexion  AAROM;10 reps  held for 5 sec, pt returned therapist demo   ABduction  AAROM;Left;10 reps   with 5 sec holds, pt returned therapist demo            PT Education - 01/30/18 1139    Education Details  anatomy and physiology of lymphatic system, lymphedema risk reduction practices, supine cane exercises    Person(s) Educated  Patient    Methods  Explanation;Handout;Demonstration    Comprehension  Verbalized understanding;Returned demonstration          PT Long Term Goals - 01/30/18 1137      PT LONG TERM GOAL #1   Title  Pt will demonstrate 165 degrees of left shoulder abduction to allow her to reach out to the side.     Baseline  126    Time  4    Period  Weeks    Status  New    Target Date  02/27/18      PT LONG TERM GOAL #2   Title  Pt will demonstrate 50 degrees of left shoulder internal rotation to be able to wash her back in the shower    Baseline  19    Time  4    Period  Weeks    Status  New    Target Date  02/27/18      PT LONG TERM GOAL #3   Title  Pt will report a 75% improvement in pain in area of left lateral trunk to allow pt to sleep at night    Time  4    Period  Weeks    Status  New    Target Date  02/27/18      PT LONG TERM GOAL #4   Title  Pt will be independent in a home exercise program for continued strengthening and stretching.     Time  4    Period  Weeks    Status  New    Target Date  02/27/18             Plan - 01/30/18 1053    Clinical Impression Statement  Pt presents to PT with decreased left shoulder ROM and left lateral trunk pain following a mastectomy for left breast cancer on 01/06/18. She had 2 lymph nodes removed and both were negative. She does not need to  complete chemotherapy or radiation. She is currently taking anti estrogen therapy. Pt is very limited with left shoulder ROM especially in direction of internal rotation and abduction. She is having a lot of pain and tightness in left lateral trunk and hopes to return to work soon. Pt would benefit from skilled PT services to improve L shoulder ROM, decrease pain and tightness in left lateral trunk.     History and Personal Factors relevant to plan of care:  lives alone    Clinical Presentation  Stable    Clinical Decision Making  Low    Rehab Potential  Excellent    Clinical Impairments Affecting Rehab Potential  None    PT Frequency  2x / week    PT Duration  4 weeks    PT Treatment/Interventions  ADLs/Self Care Home Management;Therapeutic exercise;Patient/family education;Therapeutic activities;Passive range of motion;Scar mobilization;Manual lymph drainage;Joint Manipulations    PT Next Visit Plan  begin myofascial/STM to left lateral trunk at serratus, PROM to L shoulder, pulleys, ball    PT Home Exercise Plan  supine cane exercises    Consulted and Agree with Plan of Care  Patient  Patient will benefit from skilled therapeutic intervention in order to improve the following deficits and impairments:  Decreased knowledge of precautions, Impaired UE functional use, Decreased range of motion, Postural dysfunction, Pain, Decreased strength, Increased edema, Decreased scar mobility, Increased fascial restricitons  Visit Diagnosis: Stiffness of left shoulder, not elsewhere classified  Muscle weakness (generalized)  Acute pain of left shoulder  Abnormal posture     Problem List Patient Active Problem List   Diagnosis Date Noted  . Breast cancer, left ([place]) 01/06/2018  . Malignant neoplasm of lower-inner quadrant of left breast in female, estrogen receptor positive ([place]) 07/22/2017  . Elevated BP without diagnosis of hypertension   . Gastroesophageal reflux disease   . Asthma  exacerbation 05/13/2016  . Anxiety 05/13/2016  . Depression 05/13/2016  . Erosion of suburethral sling ([place]) 11/01/2014  . Goiter, nodular 09/17/2012  . Hypothyroidism 09/04/2012  . Postop check 02/08/2011  . Nipple discharge 12/18/2010  . Right breast nipple inversion and discharge 11/06/2010    Allyson Sabal [facility] 01/30/2018, 11:40 AM  [place], [place], [address] Phone: [phone]   Fax:  [phone]  Name: Angela [name] MRN: [id number] Date of Birth: [birth year]  Manus Gunning, PT 01/30/18 11:40 AM

## 2018-01-30 NOTE — Patient Instructions (Signed)
Shoulder: Flexion (Supine)    With hands shoulder width apart, slowly lower dowel to floor behind head. Do not let elbows bend. Keep back flat. Hold _5-15___ seconds. Repeat _10___ times. Do __2__ sessions per day. CAUTION: Stretch slowly and gently.  Copyright  VHI. All rights reserved.  Shoulder: Abduction (Supine)    With left arm flat on floor, hold dowel in palm. Slowly move arm up to side of head by pushing with opposite arm. Do not let elbow bend. Hold _5-15___ seconds. Repeat _10___ times. Do _2___ sessions per day. CAUTION: Stretch slowly and gently.  Copyright  VHI. All rights reserved.   

## 2018-02-03 ENCOUNTER — Encounter: Payer: Self-pay | Admitting: Physical Therapy

## 2018-02-03 ENCOUNTER — Ambulatory Visit: Payer: 59 | Admitting: Physical Therapy

## 2018-02-03 DIAGNOSIS — M25612 Stiffness of left shoulder, not elsewhere classified: Secondary | ICD-10-CM

## 2018-02-03 DIAGNOSIS — M6281 Muscle weakness (generalized): Secondary | ICD-10-CM

## 2018-02-03 NOTE — Therapy (Signed)
[place], [place], [address] Phone: [phone]   Fax:  [phone]  Physical Therapy Treatment  Patient Details  Name: Angela [name] MRN: [id number] Date of Birth: January 23, 1956 Referring Provider (PT): Dr. Iran Planas   Encounter Date: 02/03/2018  PT End of Session - 02/03/18 1025    Visit Number  2    Number of Visits  9    Date for PT Re-Evaluation  02/27/18    Authorization - Visit Number  2    Authorization - Number of Visits  30    PT Start Time  0932    PT Stop Time  1015    PT Time Calculation (min)  43 min    Activity Tolerance  Patient tolerated treatment well    Behavior During Therapy  [facility] for tasks assessed/performed       Past Medical History:  Diagnosis Date  . Anxiety   . Asthma   . Breast cancer ([place]) 07/2017   Left Breast Cancer  . Breast discharge   . Congestion of right ear   . Depression   . GERD (gastroesophageal reflux disease)   . Headache(784.0)   . Hyperlipidemia   . Hypothyroidism   . Seizures ([place])    hx of during childhood -not since age 33  . Sleep apnea    uses CPAP  . Thyroid disease   . Tinnitus   . Urticaria     Past Surgical History:  Procedure Laterality Date  . ANTERIOR AND POSTERIOR REPAIR  2007  . BREAST DUCTAL SYSTEM EXCISION  01/18/2011   Procedure: EXCISION DUCTAL SYSTEM BREAST;  Surgeon: Belva Crome, MD;  Location: [place];  Service: General;  Laterality: Right;  . BREAST EXCISIONAL BIOPSY Right   . BREAST RECONSTRUCTION WITH PLACEMENT OF TISSUE EXPANDER AND ALLODERM Left 01/06/2018   Procedure: BREAST RECONSTRUCTION WITH PLACEMENT OF TISSUE EXPANDER AND ALLODERM;  Surgeon: Irene Limbo, MD;  Location: [place];  Service: Plastics;  Laterality: Left;  . BREAST SURGERY  01/18/2011   Right BX  . CYSTOSCOPY WITH URETHROLYSIS N/A 11/01/2014   Procedure: CYSTOSCOPY WITH URETHROLYSIS;  Surgeon: Bjorn Loser, MD;  [facility];  Service: Urology;  Laterality: N/A;  . DILATION AND CURETTAGE OF UTERUS    . PUBOVAGINAL SLING N/A 11/01/2014   Procedure: REMOVAL VAGINAL SLING AND URETHROLYSIS AND CYSTO;  Surgeon: Bjorn Loser, MD;  [facility];  Service: Urology;  Laterality: N/A;  . TUBAL LIGATION  1998  . TYMPANOSTOMY TUBE PLACEMENT  2010   right ear   . WISDOM TOOTH EXTRACTION      There were no vitals filed for this visit.  Subjective Assessment - 02/03/18 0934    Subjective  I have been doing the exercises and it does not seem as tight and as swollen. I am sleeping better at night.     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 It is an area of distortion that is ER/PR positive and HER2 negative with a Ki67 of 2%., 01/06/18- left mastectomy, SLNB (2 nodes both negative), pt does not require chemo or radiation    Patient Stated Goals  to get ROM back and eliminate some of the pain    Currently in Pain?  Yes    Pain Score  6     Pain Location  --   trunk   Pain Orientation  Left;Lateral    Pain Descriptors / Indicators  Sore                       [facility] Adult PT Treatment/Exercise - 02/03/18 0001      Shoulder Exercises: Therapy Ball   Flexion  10 reps;Both   with stretch at top. pt returned therapist demo   ABduction  10 reps;Left   with stretch at top, pt returned therapist demo     Manual Therapy   Manual Therapy  Soft tissue mobilization;Passive ROM    Soft tissue mobilization  in right sidelying to left serratus anterior in area of tightness, decreased tenderness by end    Passive ROM  to Left shoulder in supine in direction of flexion, abduction, scaption and ER                  PT Long Term Goals - 01/30/18 1137      PT LONG TERM GOAL #1   Title  Pt will demonstrate 165 degrees of left shoulder abduction to allow her to reach out to the side.     Baseline   126    Time  4    Period  Weeks    Status  New    Target Date  02/27/18      PT LONG TERM GOAL #2   Title  Pt will demonstrate 50 degrees of left shoulder internal rotation to be able to wash her back in the shower    Baseline  19    Time  4    Period  Weeks    Status  New    Target Date  02/27/18      PT LONG TERM GOAL #3   Title  Pt will report a 75% improvement in pain in area of left lateral trunk to allow pt to sleep at night    Time  4    Period  Weeks    Status  New    Target Date  02/27/18      PT LONG TERM GOAL #4   Title  Pt will be independent in a home exercise program for continued strengthening and stretching.     Time  4    Period  Weeks    Status  New    Target Date  02/27/18            Plan - 02/03/18 1026    Clinical Impression Statement  Started soft tisue mobilization to left serratus anterior in area of tightness and pain. Pt stated reduced tenderness after STM and stated it felt better afterward. Performed PROM to left shoulder in all directions with pt demonstrating improved PROM by end of session.     Rehab Potential  Excellent    Clinical Impairments Affecting Rehab Potential  None    PT Frequency  2x / week    PT Duration  4 weeks    PT Treatment/Interventions  ADLs/Self Care Home Management;Therapeutic exercise;Patient/family education;Therapeutic activities;Passive range of motion;Scar mobilization;Manual lymph drainage;Joint Manipulations    PT Next Visit Plan  continue myofascial/STM to left lateral trunk at serratus, PROM to L shoulder, pulleys, ball    PT Home Exercise Plan  supine cane exercises    Consulted and Agree with Plan of Care  Patient       Patient will benefit from skilled therapeutic intervention in order to improve the following deficits and impairments:  Decreased knowledge of precautions, Impaired UE functional use,  Decreased range of motion, Postural dysfunction, Pain, Decreased strength, Increased edema, Decreased scar  mobility, Increased fascial restricitons  Visit Diagnosis: Stiffness of left shoulder, not elsewhere classified  Muscle weakness (generalized)     Problem List Patient Active Problem List   Diagnosis Date Noted  . Breast cancer, left ([place]) 01/06/2018  . Malignant neoplasm of lower-inner quadrant of left breast in female, estrogen receptor positive ([place]) 07/22/2017  . Elevated BP without diagnosis of hypertension   . Gastroesophageal reflux disease   . Asthma exacerbation 05/13/2016  . Anxiety 05/13/2016  . Depression 05/13/2016  . Erosion of suburethral sling ([place]) 11/01/2014  . Goiter, nodular 09/17/2012  . Hypothyroidism 09/04/2012  . Postop check 02/08/2011  . Nipple discharge 12/18/2010  . Right breast nipple inversion and discharge 11/06/2010    Allyson Sabal [facility] 02/03/2018, 10:28 AM  [place], [place], [address] Phone: [phone]   Fax:  [phone]  Name: Angela [name] MRN: [id number] Date of Birth: 10/02/1955  Manus Gunning, PT 02/03/18 10:28 AM

## 2018-02-06 ENCOUNTER — Ambulatory Visit: Payer: 59 | Admitting: Physical Therapy

## 2018-02-06 ENCOUNTER — Encounter: Payer: Self-pay | Admitting: Physical Therapy

## 2018-02-06 ENCOUNTER — Other Ambulatory Visit: Payer: Self-pay

## 2018-02-06 DIAGNOSIS — M25612 Stiffness of left shoulder, not elsewhere classified: Secondary | ICD-10-CM | POA: Diagnosis not present

## 2018-02-06 DIAGNOSIS — R293 Abnormal posture: Secondary | ICD-10-CM

## 2018-02-06 DIAGNOSIS — M25512 Pain in left shoulder: Secondary | ICD-10-CM

## 2018-02-06 NOTE — Therapy (Signed)
[place], [place], [address] Phone: [phone]   Fax:  [phone]  Physical Therapy Treatment  Patient Details  Name: Angela [name] MRN: [id number] Date of Birth: 1955-07-09 Referring Provider (PT): Dr. Iran Planas   Encounter Date: 02/06/2018  PT End of Session - 02/06/18 1031    Visit Number  3    Number of Visits  9    Date for PT Re-Evaluation  02/27/18    Authorization - Visit Number  3    Authorization - Number of Visits  30    PT Start Time  [phone]   pt arrived late   PT Stop Time  0931    PT Time Calculation (min)  39 min    Activity Tolerance  Patient tolerated treatment well    Behavior During Therapy  [facility] for tasks assessed/performed       Past Medical History:  Diagnosis Date  . Anxiety   . Asthma   . Breast cancer ([place]) 07/2017   Left Breast Cancer  . Breast discharge   . Congestion of right ear   . Depression   . GERD (gastroesophageal reflux disease)   . Headache(784.0)   . Hyperlipidemia   . Hypothyroidism   . Seizures ([place])    hx of during childhood -not since age 63  . Sleep apnea    uses CPAP  . Thyroid disease   . Tinnitus   . Urticaria     Past Surgical History:  Procedure Laterality Date  . ANTERIOR AND POSTERIOR REPAIR  2007  . BREAST DUCTAL SYSTEM EXCISION  01/18/2011   Procedure: EXCISION DUCTAL SYSTEM BREAST;  Surgeon: Belva Crome, MD;  Location: [place];  Service: General;  Laterality: Right;  . BREAST EXCISIONAL BIOPSY Right   . BREAST RECONSTRUCTION WITH PLACEMENT OF TISSUE EXPANDER AND ALLODERM Left 01/06/2018   Procedure: BREAST RECONSTRUCTION WITH PLACEMENT OF TISSUE EXPANDER AND ALLODERM;  Surgeon: Irene Limbo, MD;  Location: [facility];  Service: Plastics;  Laterality: Left;  . BREAST SURGERY  01/18/2011   Right BX  . CYSTOSCOPY WITH URETHROLYSIS N/A 11/01/2014   Procedure: CYSTOSCOPY WITH  URETHROLYSIS;  Surgeon: Bjorn Loser, MD;  [facility];  Service: Urology;  Laterality: N/A;  . DILATION AND CURETTAGE OF UTERUS    . PUBOVAGINAL SLING N/A 11/01/2014   Procedure: REMOVAL VAGINAL SLING AND URETHROLYSIS AND CYSTO;  Surgeon: Bjorn Loser, MD;  [facility];  Service: Urology;  Laterality: N/A;  . TUBAL LIGATION  1998  . TYMPANOSTOMY TUBE PLACEMENT  2010   right ear   . WISDOM TOOTH EXTRACTION      There were no vitals filed for this visit.  Subjective Assessment - 02/06/18 0854    Subjective  I don't feel as tight. I did notice I got more sore after doing my housework like hanging curtains and stuff. Still having some swelling under the left arm.     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 It is an area of distortion that is ER/PR positive and HER2 negative with a Ki67 of 2%., 01/06/18- left mastectomy, SLNB (2 nodes both negative), pt does not require chemo or radiation    Patient Stated Goals  to get ROM back and eliminate some of the pain    Currently in Pain?  Yes    Pain Score  4     Pain  Location  --   trunk   Pain Orientation  Left;Lateral         [facility] PT Assessment - 02/06/18 0001      AROM   Left Shoulder Flexion  160 Degrees    Left Shoulder ABduction  160 Degrees                   [facility] Adult PT Treatment/Exercise - 02/06/18 0001      Shoulder Exercises: Pulleys   Flexion  2 minutes   pt returned therapist demo   ABduction  2 minutes   pt returned therapist demo     Shoulder Exercises: Therapy Ball   Flexion  10 reps;Both   with stretch at top. pt returned therapist demo   ABduction  10 reps;Left   with stretch at top, pt returned therapist demo     Manual Therapy   Manual Therapy  Soft tissue mobilization;Passive ROM    Soft tissue mobilization  in right sidelying to left serratus anterior in area of tightness, decreased tenderness by end     Passive ROM  to Left shoulder in supine in direction of flexion, abduction, scaption and ER                  PT Long Term Goals - 01/30/18 1137      PT LONG TERM GOAL #1   Title  Pt will demonstrate 165 degrees of left shoulder abduction to allow her to reach out to the side.     Baseline  126    Time  4    Period  Weeks    Status  New    Target Date  02/27/18      PT LONG TERM GOAL #2   Title  Pt will demonstrate 50 degrees of left shoulder internal rotation to be able to wash her back in the shower    Baseline  19    Time  4    Period  Weeks    Status  New    Target Date  02/27/18      PT LONG TERM GOAL #3   Title  Pt will report a 75% improvement in pain in area of left lateral trunk to allow pt to sleep at night    Time  4    Period  Weeks    Status  New    Target Date  02/27/18      PT LONG TERM GOAL #4   Title  Pt will be independent in a home exercise program for continued strengthening and stretching.     Time  4    Period  Weeks    Status  New    Target Date  02/27/18            Plan - 02/06/18 0930    Clinical Impression Statement  Continued with AAROM exercise today. Remeasured pt's ROM and she demonstates signficant gains in ROM since eval. She has had a 40 degree increase in left shoulder abduction. She will be returning to work on Monday. WIll begin instructing pt in strengthening exercises at next session.    Rehab Potential  Excellent    Clinical Impairments Affecting Rehab Potential  None    PT Frequency  2x / week    PT Duration  4 weeks    PT Treatment/Interventions  ADLs/Self Care Home Management;Therapeutic exercise;Patient/family education;Therapeutic activities;Passive range of motion;Scar mobilization;Manual lymph drainage;Joint Manipulations    PT Next Visit Plan  instruct in supine scap series, continue myofascial/STM to left lateral trunk at serratus, PROM to L shoulder, pulleys, ball, prior to discharge instruct in Strength ABC  program    PT Home Exercise Plan  supine cane exercises    Consulted and Agree with Plan of Care  Patient       Patient will benefit from skilled therapeutic intervention in order to improve the following deficits and impairments:  Decreased knowledge of precautions, Impaired UE functional use, Decreased range of motion, Postural dysfunction, Pain, Decreased strength, Increased edema, Decreased scar mobility, Increased fascial restricitons  Visit Diagnosis: Stiffness of left shoulder, not elsewhere classified  Acute pain of left shoulder  Abnormal posture     Problem List Patient Active Problem List   Diagnosis Date Noted  . Breast cancer, left ([place]) 01/06/2018  . Malignant neoplasm of lower-inner quadrant of left breast in female, estrogen receptor positive ([place]) 07/22/2017  . Elevated BP without diagnosis of hypertension   . Gastroesophageal reflux disease   . Asthma exacerbation 05/13/2016  . Anxiety 05/13/2016  . Depression 05/13/2016  . Erosion of suburethral sling ([place]) 11/01/2014  . Goiter, nodular 09/17/2012  . Hypothyroidism 09/04/2012  . Postop check 02/08/2011  . Nipple discharge 12/18/2010  . Right breast nipple inversion and discharge 11/06/2010    Allyson Sabal [facility] 02/06/2018, 10:32 AM  [place], [place], [address] Phone: [phone]   Fax:  [phone]  Name: REGENE MCCARTHY MRN: [id number] Date of Birth: 1955/06/16  Manus Gunning, PT 02/06/18 10:32 AM

## 2018-02-09 ENCOUNTER — Encounter: Payer: 59 | Admitting: Physical Therapy

## 2018-02-09 ENCOUNTER — Ambulatory Visit: Payer: 59 | Admitting: Physical Therapy

## 2018-02-13 ENCOUNTER — Encounter: Payer: Self-pay | Admitting: Physical Therapy

## 2018-02-13 ENCOUNTER — Encounter: Payer: 59 | Admitting: Physical Therapy

## 2018-02-13 ENCOUNTER — Other Ambulatory Visit: Payer: Self-pay

## 2018-02-13 ENCOUNTER — Ambulatory Visit: Payer: 59 | Admitting: Physical Therapy

## 2018-02-13 DIAGNOSIS — M25612 Stiffness of left shoulder, not elsewhere classified: Secondary | ICD-10-CM

## 2018-02-13 DIAGNOSIS — M25512 Pain in left shoulder: Secondary | ICD-10-CM

## 2018-02-13 DIAGNOSIS — R293 Abnormal posture: Secondary | ICD-10-CM

## 2018-02-13 NOTE — Therapy (Signed)
[place], [place], [address] Phone: [phone]   Fax:  [phone]  Physical Therapy Treatment  Patient Details  Name: Angela [name] MRN: [id number] Date of Birth: August 06, 1955 Referring Provider (PT): Dr. Iran Planas   Encounter Date: 02/13/2018  PT End of Session - 02/13/18 0951    Visit Number  4    Number of Visits  9    Date for PT Re-Evaluation  02/27/18    Authorization - Visit Number  4    Authorization - Number of Visits  30    PT Start Time  0805    PT Stop Time  7681    PT Time Calculation (min)  44 min    Activity Tolerance  Patient tolerated treatment well    Behavior During Therapy  [facility] for tasks assessed/performed       Past Medical History:  Diagnosis Date  . Anxiety   . Asthma   . Breast cancer ([place]) 07/2017   Left Breast Cancer  . Breast discharge   . Congestion of right ear   . Depression   . GERD (gastroesophageal reflux disease)   . Headache(784.0)   . Hyperlipidemia   . Hypothyroidism   . Seizures ([place])    hx of during childhood -not since age 24  . Sleep apnea    uses CPAP  . Thyroid disease   . Tinnitus   . Urticaria     Past Surgical History:  Procedure Laterality Date  . ANTERIOR AND POSTERIOR REPAIR  2007  . BREAST DUCTAL SYSTEM EXCISION  01/18/2011   Procedure: EXCISION DUCTAL SYSTEM BREAST;  Surgeon: Belva Crome, MD;  Location: [facility];  Service: General;  Laterality: Right;  . BREAST EXCISIONAL BIOPSY Right   . BREAST RECONSTRUCTION WITH PLACEMENT OF TISSUE EXPANDER AND ALLODERM Left 01/06/2018   Procedure: BREAST RECONSTRUCTION WITH PLACEMENT OF TISSUE EXPANDER AND ALLODERM;  Surgeon: Irene Limbo, MD;  Location: [facility];  Service: Plastics;  Laterality: Left;  . BREAST SURGERY  01/18/2011   Right BX  . CYSTOSCOPY WITH URETHROLYSIS N/A 11/01/2014   Procedure: CYSTOSCOPY WITH URETHROLYSIS;  Surgeon: Bjorn Loser, MD;  [facility];  Service: Urology;  Laterality: N/A;  . DILATION AND CURETTAGE OF UTERUS    . PUBOVAGINAL SLING N/A 11/01/2014   Procedure: REMOVAL VAGINAL SLING AND URETHROLYSIS AND CYSTO;  Surgeon: Bjorn Loser, MD;  [facility];  Service: Urology;  Laterality: N/A;  . TUBAL LIGATION  1998  . TYMPANOSTOMY TUBE PLACEMENT  2010   right ear   . WISDOM TOOTH EXTRACTION      There were no vitals filed for this visit.  Subjective Assessment - 02/13/18 0807    Subjective  It is getting better. I am back at work now. I get a little sore when I get tired.     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 It is an area of distortion that is ER/PR positive and HER2 negative with a Ki67 of 2%., 01/06/18- left mastectomy, SLNB (2 nodes both negative), pt does not require chemo or radiation    Patient Stated Goals  to get ROM back and eliminate some of the pain    Currently in Pain?  Yes    Pain Score  3     Pain Location  --   trunk  [facility] Adult PT Treatment/Exercise - 02/13/18 0001      Shoulder Exercises: Pulleys   Flexion  2 minutes   pt returned therapist demo   ABduction  2 minutes   pt returned therapist demo     Shoulder Exercises: Therapy Ball   Flexion  10 reps;Both   with stretch at top. pt returned therapist demo   ABduction  10 reps;Left   with stretch at top, pt returned therapist demo     Manual Therapy   Soft tissue mobilization  in right sidelying to left serratus anterior in area of tightness, decreased tenderness by end                  PT Long Term Goals - 02/13/18 2119      PT LONG TERM GOAL #1   Title  Pt will demonstrate 165 degrees of left shoulder abduction to allow her to reach out to the side.     Baseline  126, 02/13/18- 170    Time  4    Period  Weeks    Status  Achieved      PT LONG TERM GOAL #2   Title  Pt  will demonstrate 50 degrees of left shoulder internal rotation to be able to wash her back in the shower    Baseline  19, 02/13/18- 55    Time  4    Period  Weeks    Status  Achieved      PT LONG TERM GOAL #3   Title  Pt will report a 75% improvement in pain in area of left lateral trunk to allow pt to sleep at night    Baseline  02/13/18- 85% improvement    Time  4    Period  Weeks    Status  Achieved      PT LONG TERM GOAL #4   Title  Pt will be independent in a home exercise program for continued strengthening and stretching.     Time  4    Period  Weeks    Status  On-going      PT LONG TERM GOAL #5   Title  Pt will report the maximum pain she has in her left lateral trunk is 2/10 at the end of her work day for improved comfort    Baseline  02/13/18- 4/10    Time  4    Period  Weeks    Status  New    Target Date  03/13/18            Plan - 02/13/18 4174    Clinical Impression Statement  Pt has now met ROM for therapy. She is still having discomfort in left lateral trunk especially by the end of her work day. She has increased tightness in musculature in this area today. Educated pt to use a tennis ball on the wall to roll this area and decrease tightness. Added goal for this. Next session will reassess her pain as well as begin instructing in Strength ABC program.     Rehab Potential  Excellent    Clinical Impairments Affecting Rehab Potential  None    PT Frequency  2x / week    PT Duration  4 weeks    PT Treatment/Interventions  ADLs/Self Care Home Management;Therapeutic exercise;Patient/family education;Therapeutic activities;Passive range of motion;Scar mobilization;Manual lymph drainage;Joint Manipulations    PT Next Visit Plan  assess left lateral trunk pain, see if chip pack helped, see if tennis ball helped, instruct  in Strength ABC program    PT Home Exercise Plan  supine cane exercises    Consulted and Agree with Plan of Care  Patient       Patient will benefit  from skilled therapeutic intervention in order to improve the following deficits and impairments:  Decreased knowledge of precautions, Impaired UE functional use, Decreased range of motion, Postural dysfunction, Pain, Decreased strength, Increased edema, Decreased scar mobility, Increased fascial restricitons  Visit Diagnosis: Acute pain of left shoulder  Stiffness of left shoulder, not elsewhere classified  Abnormal posture     Problem List Patient Active Problem List   Diagnosis Date Noted  . Breast cancer, left ([place]) 01/06/2018  . Malignant neoplasm of lower-inner quadrant of left breast in female, estrogen receptor positive ([place]) 07/22/2017  . Elevated BP without diagnosis of hypertension   . Gastroesophageal reflux disease   . Asthma exacerbation 05/13/2016  . Anxiety 05/13/2016  . Depression 05/13/2016  . Erosion of suburethral sling ([place]) 11/01/2014  . Goiter, nodular 09/17/2012  . Hypothyroidism 09/04/2012  . Postop check 02/08/2011  . Nipple discharge 12/18/2010  . Right breast nipple inversion and discharge 11/06/2010    Allyson Sabal [place] 02/13/2018, 9:54 AM  [place], [place], [address] Phone: [phone]   Fax:  [phone]  Name: Angela [name] MRN: [id number] Date of Birth: 09/28/1955  Manus Gunning, PT 02/13/18 9:54 AM

## 2018-02-17 ENCOUNTER — Ambulatory Visit: Payer: 59 | Admitting: Physical Therapy

## 2018-02-17 ENCOUNTER — Encounter: Payer: Self-pay | Admitting: Physical Therapy

## 2018-02-17 DIAGNOSIS — M25612 Stiffness of left shoulder, not elsewhere classified: Secondary | ICD-10-CM

## 2018-02-17 DIAGNOSIS — M25512 Pain in left shoulder: Secondary | ICD-10-CM

## 2018-02-17 DIAGNOSIS — M6281 Muscle weakness (generalized): Secondary | ICD-10-CM

## 2018-02-17 DIAGNOSIS — R293 Abnormal posture: Secondary | ICD-10-CM

## 2018-02-17 NOTE — Therapy (Signed)
[place], [place], [address] Phone: [phone]   Fax:  [phone]  Physical Therapy Treatment  Patient Details  Name: Angela [name] MRN: [id number] Date of Birth: 03/04/1955 Referring Provider (PT): Dr. Iran Planas   Encounter Date: 02/17/2018  PT End of Session - 02/17/18 1609    Visit Number  5    Number of Visits  9    Date for PT Re-Evaluation  02/27/18    Authorization - Visit Number  5    Authorization - Number of Visits  30    PT Start Time  1520    PT Stop Time  1600    PT Time Calculation (min)  40 min    Activity Tolerance  Patient tolerated treatment well    Behavior During Therapy  [facility] for tasks assessed/performed       Past Medical History:  Diagnosis Date  . Anxiety   . Asthma   . Breast cancer ([place]) 07/2017   Left Breast Cancer  . Breast discharge   . Congestion of right ear   . Depression   . GERD (gastroesophageal reflux disease)   . Headache(784.0)   . Hyperlipidemia   . Hypothyroidism   . Seizures ([place])    hx of during childhood -not since age 29  . Sleep apnea    uses CPAP  . Thyroid disease   . Tinnitus   . Urticaria     Past Surgical History:  Procedure Laterality Date  . ANTERIOR AND POSTERIOR REPAIR  2007  . BREAST DUCTAL SYSTEM EXCISION  01/18/2011   Procedure: EXCISION DUCTAL SYSTEM BREAST;  Surgeon: Belva Crome, MD;  Location: [place];  Service: General;  Laterality: Right;  . BREAST EXCISIONAL BIOPSY Right   . BREAST RECONSTRUCTION WITH PLACEMENT OF TISSUE EXPANDER AND ALLODERM Left 01/06/2018   Procedure: BREAST RECONSTRUCTION WITH PLACEMENT OF TISSUE EXPANDER AND ALLODERM;  Surgeon: Irene Limbo, MD;  Location: [place];  Service: Plastics;  Laterality: Left;  . BREAST SURGERY  01/18/2011   Right BX  . CYSTOSCOPY WITH URETHROLYSIS N/A 11/01/2014   Procedure: CYSTOSCOPY WITH URETHROLYSIS;  Surgeon: Bjorn Loser, MD;  [facility];  Service: Urology;  Laterality: N/A;  . DILATION AND CURETTAGE OF UTERUS    . PUBOVAGINAL SLING N/A 11/01/2014   Procedure: REMOVAL VAGINAL SLING AND URETHROLYSIS AND CYSTO;  Surgeon: Bjorn Loser, MD;  Location[facility];  Service: Urology;  Laterality: N/A;  . TUBAL LIGATION  1998  . TYMPANOSTOMY TUBE PLACEMENT  2010   right ear   . WISDOM TOOTH EXTRACTION      There were no vitals filed for this visit.  Subjective Assessment - 02/17/18 1605    Subjective  "I think I am doing really well"  Pt says she is using the tennis ball and feels that her pain is much better. She thinks that her discomfort is because it is an expander and tends to shift around a little bit.     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 It is an area of distortion that is ER/PR positive and HER2 negative with a Ki67 of 2%., 01/06/18- left mastectomy, SLNB (2 nodes both negative), pt does not require chemo or radiation    Patient Stated Goals  to get ROM back and eliminate some of the pain    Currently in Pain?  No/denies  [facility] Adult PT Treatment/Exercise - 02/17/18 0001      Exercises   Exercises  Other Exercises    Other Exercises   Instructed in basics of Strength ABC program and demonstrated program up through squats with modification for clam instead of sit up.  Pt was able to demonstrate them well and seems ready to start exercising  Also gave her information about [employer], exercise classes at the cancer center and [facility] aque aerobic classes              PT Education - 02/17/18 1608    Education Details  strength ABC program     Person(s) Educated  Patient    Methods  Explanation;Demonstration;Handout    Comprehension  Need further instruction          PT Long Term Goals - 02/13/18 0808      PT LONG TERM GOAL #1   Title  Pt will  demonstrate 165 degrees of left shoulder abduction to allow her to reach out to the side.     Baseline  126, 02/13/18- 170    Time  4    Period  Weeks    Status  Achieved      PT LONG TERM GOAL #2   Title  Pt will demonstrate 50 degrees of left shoulder internal rotation to be able to wash her back in the shower    Baseline  19, 02/13/18- 55    Time  4    Period  Weeks    Status  Achieved      PT LONG TERM GOAL #3   Title  Pt will report a 75% improvement in pain in area of left lateral trunk to allow pt to sleep at night    Baseline  02/13/18- 85% improvement    Time  4    Period  Weeks    Status  Achieved      PT LONG TERM GOAL #4   Title  Pt will be independent in a home exercise program for continued strengthening and stretching.     Time  4    Period  Weeks    Status  On-going      PT LONG TERM GOAL #5   Title  Pt will report the maximum pain she has in her left lateral trunk is 2/10 at the end of her work day for improved comfort    Baseline  02/13/18- 4/10    Time  4    Period  Weeks    Status  New    Target Date  03/13/18            Plan - 02/17/18 1609    Clinical Impression Statement  Pt reports she is doing well and does not need any more manual work.  She was attentive to beginning instruction in Strength ABC program     Rehab Potential  Excellent    Clinical Impairments Affecting Rehab Potential  None    PT Frequency  2x / week    PT Treatment/Interventions  ADLs/Self Care Home Management;Therapeutic exercise;Patient/family education;Therapeutic activities;Passive range of motion;Scar mobilization;Manual lymph drainage;Joint Manipulations    PT Next Visit Plan  continue with Strength ABC instruction, review stretches and core, chest press and squat and continue with instruction after that.     Consulted and Agree with Plan of Care  Patient       Patient will benefit from skilled therapeutic intervention in order to improve the following deficits and  impairments:  Decreased knowledge of precautions, Impaired UE functional use, Decreased range of motion, Postural dysfunction, Pain, Decreased strength, Increased edema, Decreased scar mobility, Increased fascial restricitons  Visit Diagnosis: Acute pain of left shoulder  Stiffness of left shoulder, not elsewhere classified  Abnormal posture  Muscle weakness (generalized)     Problem List Patient Active Problem List   Diagnosis Date Noted  . Breast cancer, left ([place]) 01/06/2018  . Malignant neoplasm of lower-inner quadrant of left breast in female, estrogen receptor positive ([place]) 07/22/2017  . Elevated BP without diagnosis of hypertension   . Gastroesophageal reflux disease   . Asthma exacerbation 05/13/2016  . Anxiety 05/13/2016  . Depression 05/13/2016  . Erosion of suburethral sling ([place]) 11/01/2014  . Goiter, nodular 09/17/2012  . Hypothyroidism 09/04/2012  . Postop check 02/08/2011  . Nipple discharge 12/18/2010  . Right breast nipple inversion and discharge 11/06/2010   Donato Heinz. Owens Shark PT  [place] Levo 02/17/2018, [place], [place], [address] Phone: [phone]   Fax:  [phone]  Name: Angela [name] MRN: [id number] Date of Birth: 01/21/1956

## 2018-02-20 ENCOUNTER — Encounter: Payer: Self-pay | Admitting: Rehabilitation

## 2018-02-20 ENCOUNTER — Ambulatory Visit: Payer: 59 | Admitting: Rehabilitation

## 2018-02-20 DIAGNOSIS — M25612 Stiffness of left shoulder, not elsewhere classified: Secondary | ICD-10-CM

## 2018-02-20 DIAGNOSIS — R293 Abnormal posture: Secondary | ICD-10-CM

## 2018-02-20 DIAGNOSIS — M6281 Muscle weakness (generalized): Secondary | ICD-10-CM

## 2018-02-20 DIAGNOSIS — C50312 Malignant neoplasm of lower-inner quadrant of left female breast: Secondary | ICD-10-CM

## 2018-02-20 DIAGNOSIS — M25512 Pain in left shoulder: Secondary | ICD-10-CM

## 2018-02-20 DIAGNOSIS — Z170 Estrogen receptor positive status [ER+]: Secondary | ICD-10-CM

## 2018-02-20 NOTE — Therapy (Signed)
[place], [place], [address] Phone: [phone]   Fax:  [phone]  Physical Therapy Treatment  Patient Details  Name: Angela [name] MRN: [id number] Date of Birth: 06-27-1955 Referring Provider (PT): Dr. Iran Planas   Encounter Date: 02/20/2018  PT End of Session - 02/20/18 0845    Visit Number  6    Number of Visits  9    Date for PT Re-Evaluation  02/27/18    PT Start Time  0846    PT Stop Time  0915    PT Time Calculation (min)  29 min    Activity Tolerance  Patient tolerated treatment well    Behavior During Therapy  [facility] for tasks assessed/performed       Past Medical History:  Diagnosis Date  . Anxiety   . Asthma   . Breast cancer ([place]) 07/2017   Left Breast Cancer  . Breast discharge   . Congestion of right ear   . Depression   . GERD (gastroesophageal reflux disease)   . Headache(784.0)   . Hyperlipidemia   . Hypothyroidism   . Seizures ([place])    hx of during childhood -not since age 80  . Sleep apnea    uses CPAP  . Thyroid disease   . Tinnitus   . Urticaria     Past Surgical History:  Procedure Laterality Date  . ANTERIOR AND POSTERIOR REPAIR  2007  . BREAST DUCTAL SYSTEM EXCISION  01/18/2011   Procedure: EXCISION DUCTAL SYSTEM BREAST;  Surgeon: Belva Crome, MD;  Location: [place];  Service: General;  Laterality: Right;  . BREAST EXCISIONAL BIOPSY Right   . BREAST RECONSTRUCTION WITH PLACEMENT OF TISSUE EXPANDER AND ALLODERM Left 01/06/2018   Procedure: BREAST RECONSTRUCTION WITH PLACEMENT OF TISSUE EXPANDER AND ALLODERM;  Surgeon: Irene Limbo, MD;  Location: [place];  Service: Plastics;  Laterality: Left;  . BREAST SURGERY  01/18/2011   Right BX  . CYSTOSCOPY WITH URETHROLYSIS N/A 11/01/2014   Procedure: CYSTOSCOPY WITH URETHROLYSIS;  Surgeon: Bjorn Loser, MD;  [facility];  Service: Urology;  Laterality: N/A;  .  DILATION AND CURETTAGE OF UTERUS    . PUBOVAGINAL SLING N/A 11/01/2014   Procedure: REMOVAL VAGINAL SLING AND URETHROLYSIS AND CYSTO;  Surgeon: Bjorn Loser, MD;  [facility];  Service: Urology;  Laterality: N/A;  . TUBAL LIGATION  1998  . TYMPANOSTOMY TUBE PLACEMENT  2010   right ear   . WISDOM TOOTH EXTRACTION      There were no vitals filed for this visit.  Subjective Assessment - 02/20/18 0845    Subjective  I am ready to be done.  I feel good with everything.      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 It is an area of distortion that is ER/PR positive and HER2 negative with a Ki67 of 2%., 01/06/18- left mastectomy, SLNB (2 nodes both negative), pt does not require chemo or radiation    Patient Stated Goals  to get ROM back and eliminate some of the pain    Currently in Pain?  No/denies                       [facility] Adult PT Treatment/Exercise - 02/20/18 0001      Exercises   Other Exercises   Completed instruction in strength ABC program with pt performing each  weight exercise x 5 and each stretch x 15seconds cueing as needed                   PT Long Term Goals - 02/20/18 0848      PT LONG TERM GOAL #1   Title  Pt will demonstrate 165 degrees of left shoulder abduction to allow her to reach out to the side.     Status  Achieved      PT LONG TERM GOAL #2   Title  Pt will demonstrate 50 degrees of left shoulder internal rotation to be able to wash her back in the shower    Status  Achieved      PT LONG TERM GOAL #3   Title  Pt will report a 75% improvement in pain in area of left lateral trunk to allow pt to sleep at night    Status  Achieved      PT LONG TERM GOAL #4   Title  Pt will be independent in a home exercise program for continued strengthening and stretching.     Status  Achieved      PT LONG TERM GOAL #5   Title  Pt will report the maximum pain she has  in her left lateral trunk is 2/10 at the end of her work day for improved comfort    Status  Achieved            Plan - 02/20/18 0908    Clinical Impression Statement  Pt feels good today and ready for D/C.  All goals have been met at this time       Patient will benefit from skilled therapeutic intervention in order to improve the following deficits and impairments:  Decreased knowledge of precautions, Impaired UE functional use, Decreased range of motion, Postural dysfunction, Pain, Decreased strength, Increased edema, Decreased scar mobility, Increased fascial restricitons  Visit Diagnosis: Acute pain of left shoulder  Stiffness of left shoulder, not elsewhere classified  Abnormal posture  Muscle weakness (generalized)  Malignant neoplasm of lower-inner quadrant of left breast in female, estrogen receptor positive ([place])     Problem List Patient Active Problem List   Diagnosis Date Noted  . Breast cancer, left ([place]) 01/06/2018  . Malignant neoplasm of lower-inner quadrant of left breast in female, estrogen receptor positive ([place]) 07/22/2017  . Elevated BP without diagnosis of hypertension   . Gastroesophageal reflux disease   . Asthma exacerbation 05/13/2016  . Anxiety 05/13/2016  . Depression 05/13/2016  . Erosion of suburethral sling ([place]) 11/01/2014  . Goiter, nodular 09/17/2012  . Hypothyroidism 09/04/2012  . Postop check 02/08/2011  . Nipple discharge 12/18/2010  . Right breast nipple inversion and discharge 11/06/2010    Shan Levans, PT 02/20/2018, 9:20 AM  [place], [place], [address] Phone: [phone]   Fax:  [phone]  Name: CAMARYN LUMBERT MRN: [id number] Date of Birth: Mar 30, 1955  PHYSICAL THERAPY DISCHARGE SUMMARY  Visits from Start of Care: 6  Current functional level related to goals / functional outcomes: See above   Remaining deficits: Need for  continued strength and endurance   Education / Equipment: HEP  Plan: Patient agrees to discharge.  Patient goals were met. Patient is being discharged due to meeting the stated rehab goals.  ?????     Shan Levans, PT

## 2018-02-23 ENCOUNTER — Encounter: Payer: 59 | Admitting: Rehabilitation

## 2018-02-26 ENCOUNTER — Encounter: Payer: 59 | Admitting: Physical Therapy

## 2018-02-26 ENCOUNTER — Other Ambulatory Visit: Payer: Self-pay | Admitting: Hematology and Oncology

## 2018-04-26 IMAGING — CR DG CHEST 2V
2 series · 2 of 2 positions shown · non-contrast
Comparison: 10/08/2012

CLINICAL DATA: Productive cough

EXAM:
CHEST  2 VIEW

[w chest pa]
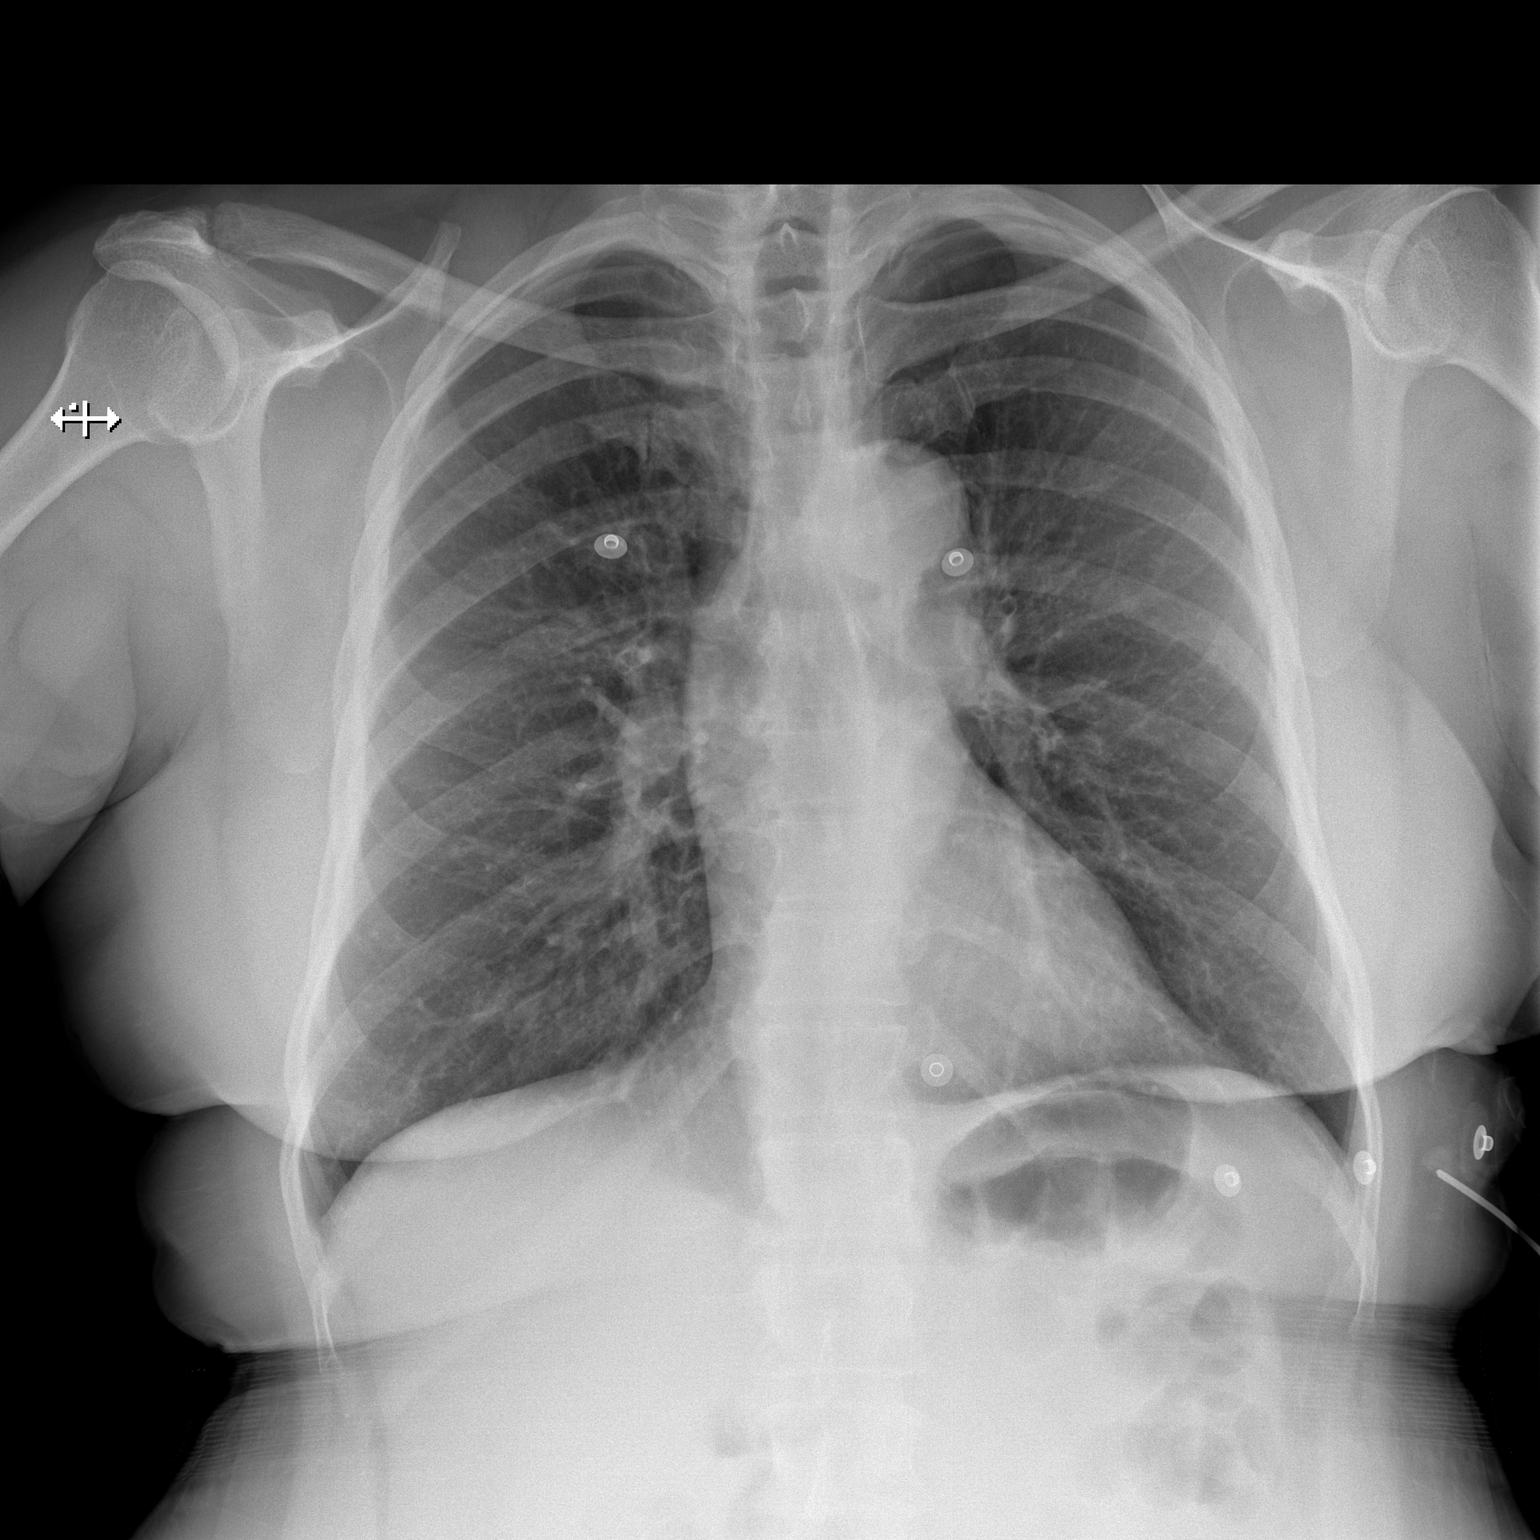

[w chest lat]
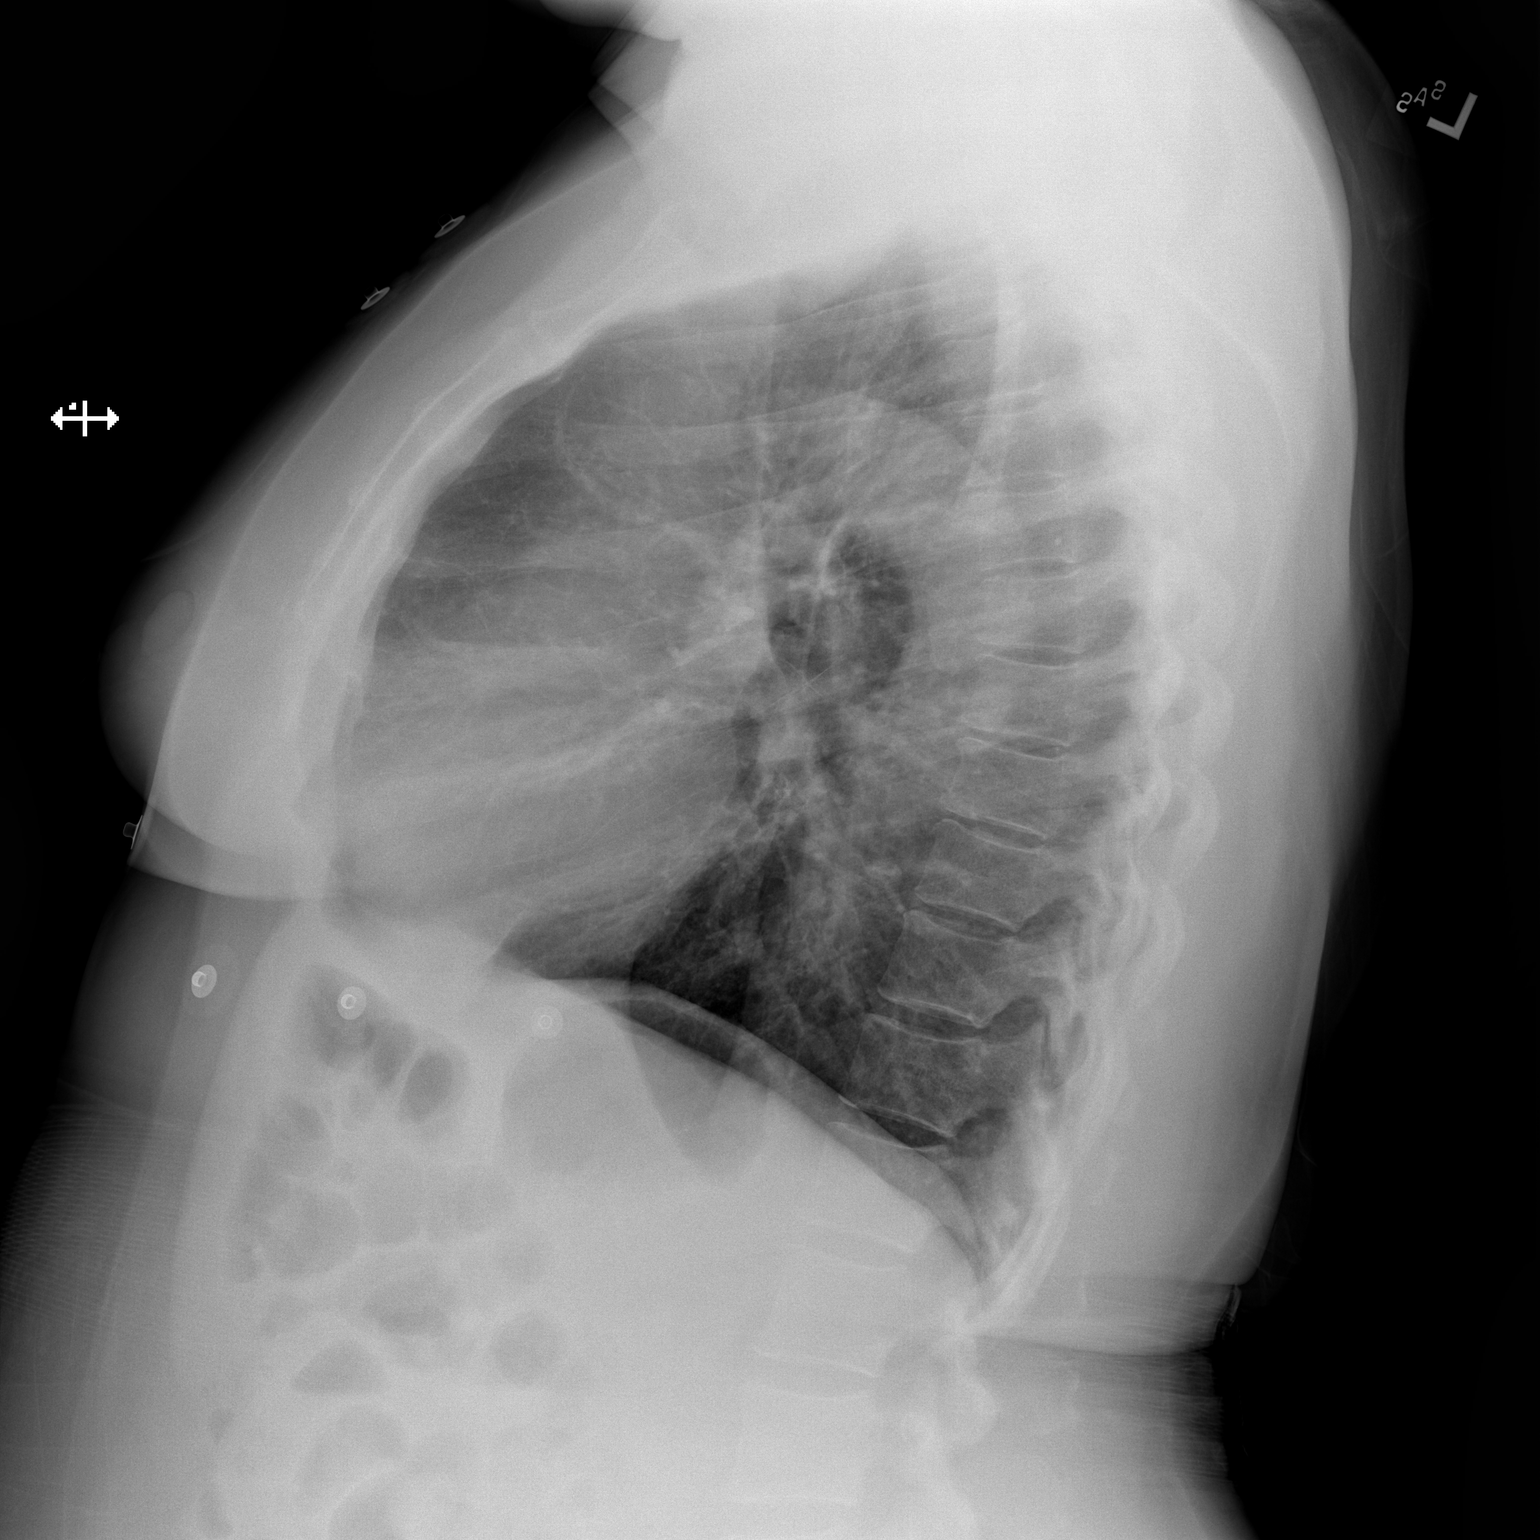

[2 of 2 positions shown; findings below may reference images not displayed]

FINDINGS: Normal heart size and unremarkable mediastinal contours. No acute
infiltrate or edema. No effusion or pneumothorax. No acute osseous
findings.
IMPRESSION: Negative chest.

## 2018-06-02 ENCOUNTER — Other Ambulatory Visit: Payer: Self-pay | Admitting: Plastic Surgery

## 2018-06-12 ENCOUNTER — Encounter (HOSPITAL_BASED_OUTPATIENT_CLINIC_OR_DEPARTMENT_OTHER): Admission: RE | Payer: Self-pay | Source: Home / Self Care

## 2018-06-12 ENCOUNTER — Encounter (HOSPITAL_BASED_OUTPATIENT_CLINIC_OR_DEPARTMENT_OTHER): Payer: Self-pay

## 2018-06-12 ENCOUNTER — Ambulatory Visit (HOSPITAL_BASED_OUTPATIENT_CLINIC_OR_DEPARTMENT_OTHER): Admission: RE | Admit: 2018-06-12 | Payer: 59 | Source: Home / Self Care | Admitting: Plastic Surgery

## 2018-06-12 ENCOUNTER — Ambulatory Visit (HOSPITAL_BASED_OUTPATIENT_CLINIC_OR_DEPARTMENT_OTHER): Admit: 2018-06-12 | Payer: 59 | Admitting: Plastic Surgery

## 2018-06-12 SURGERY — REMOVAL, TISSUE EXPANDER, BREAST, WITH IMPLANT INSERTION
Anesthesia: General | Site: Chest | Laterality: Right

## 2018-06-24 NOTE — H&P (Signed)
Subjective:     Patient ID: Angela [name] is a 63 y.o. female.  HPI  5.5 post op. Scheduled for second stage reconstruction next month- this has been delayed secondary to [place]. Wt up 10 lb since last visit here.  Presented following screening MMG 06/2017 with left breast asymmetry. No [place] correlate. Biopsy with IDC with DCIS, ER/PR+, Her2-. MRI demonstrated biopsy-proven carcinoma within the LOQ 4.4 cm, with nearly contiguous NME to 5.5 cm. Additional clumped NME within the LOQ, at middle depth and anterior depth noted with overall extent over 8 cm within the outer LEFT breast. At least 5 morphologically abnormal LN in the LEFT axilla noted.  Additional biopsies labeled left anterior interior and lower outer with high grade DCIS. Biopsy LN read as benign with no lymphoid tissue.   Started on neoadjuvant letrozole. Plan was for 6 months of this prior to reimaging. Reported increased pain and underwent MMG/US 10/2017- no adenopathy bilateral noted, left breast LOQ 3.1 cm mass noted, second 7 mm nodule in the lower outer may represent 1 of the sites of biopsy-proven DCIS.   Final pathology 3 cm IDC, margins clear, 0/1 SLN. Oncotype 12. Continues on letrozole.  Prior 81 B, desires larger. Left mastectomy 502 g   Works at call center for [employer].  PMH includes OSA on CPAP.   Review of Systems    Objective:   Physical Exam  Cardiovascular: Normal rate, regular rhythm and normal heart sounds.  Pulmonary/Chest: Effort normal and breath sounds normal.  Abdominal: Soft.  No hernias   Chest: scar maturing soft expanded Some depression contour left UOQ with arms raised no visible rippling no animation Right breast with grade 1 ptosis SN to nipple R24 L 23.5 cm BW R22 (CW 15 cm) Nipple to IMFR 8 L 8 cm    Assessment:     Left breast ca overlapping sites ER+,  Neoadjuvant letrozole S/p left NSM, SLN, prepectoral TE/ADM (Alloderm) reconstruction    Plan:   Last  screening MMG 6.7.2019- recommend screening MMG prior to surgery. She will call to arrange.  Plan implant exchange on left. Reviewed with patient asymmetry that will be present with unilateral implant reconstruction including more upper pole fullness less ptosis on left. We had previously planned right breast augmentation if desires small lift to tissue and fuller upper pole. Patient returns today expressing concern for future cancer or abnormality over right breast and had decided against any surgery to right breast. We reviewed she has some asymmetry NAC position and she accepts this will continue post operative left implant exchange alone.  Reviewed saline vs silicone, shaped vs round. HCG or capacity filled silicone implants may offer reduced risk visible rippling. Reviewed MRI or USsurveillance for rupture with silicone implants. Reviewed no implant is permanent device and may require additional surgery.Reviewed examples for 4th generation, capacity filled 4th generation, and HCG implants vs saline implants. Reviewed risks AP flipping that may be more noticeable with 5th generation implants, may require surgery to correct. Discussed texturing and risk ALCL with this. Patient agrees to silicone, plan smooth round capacity filled.  Reviewed purpose fat grafting to thicken flaps to minimize visible rippling, contour depression. Discussed variable take graft, fat necrosis that presents as lump, donor site pain and compression. Patient not bothered by depression left UOQ and axilla and will defer this for now.   Discussed risk COVID infectionthrough this elective surgery. Patient will receive COVID testing prior to surgery. Discussed even if patient receivesa negative test result,  the tests in some cases may fail to detect the virus or patient maycontract COVID after the test.COVID 19 infectionbefore/during/aftersurgery may result in lead to a higher chance of complication and death.  Rx  for Bactrim and Oxycodone given. She is working from home, planning 2 weeks leave.  Natrelle 133S FV-12-T 400 ml tissue expander placed,  fill volume 380 ml saline.    Irene Limbo, MD [facility] Plastic & Reconstructive Surgery [phone], pin [phone]

## 2018-07-02 ENCOUNTER — Other Ambulatory Visit: Payer: Self-pay | Admitting: Family Medicine

## 2018-07-02 DIAGNOSIS — Z1231 Encounter for screening mammogram for malignant neoplasm of breast: Secondary | ICD-10-CM

## 2018-07-08 ENCOUNTER — Other Ambulatory Visit: Payer: Self-pay

## 2018-07-08 ENCOUNTER — Encounter (HOSPITAL_BASED_OUTPATIENT_CLINIC_OR_DEPARTMENT_OTHER): Payer: Self-pay | Admitting: *Deleted

## 2018-07-08 ENCOUNTER — Telehealth: Payer: Self-pay | Admitting: Adult Health

## 2018-07-08 NOTE — Telephone Encounter (Signed)
I left a message regarding visit and if she doesn't have the capabilty can do a phone visit

## 2018-07-09 ENCOUNTER — Ambulatory Visit
Admission: RE | Admit: 2018-07-09 | Discharge: 2018-07-09 | Disposition: A | Discharge status: Home / Self Care | Payer: 59 | Source: Ambulatory Visit | Attending: Family Medicine | Admitting: Family Medicine

## 2018-07-09 DIAGNOSIS — Z1231 Encounter for screening mammogram for malignant neoplasm of breast: Secondary | ICD-10-CM

## 2018-07-10 ENCOUNTER — Other Ambulatory Visit (HOSPITAL_COMMUNITY)
Admission: RE | Admit: 2018-07-10 | Discharge: 2018-07-10 | Disposition: A | Discharge status: Home / Self Care | Payer: 59 | Source: Ambulatory Visit | Attending: Plastic Surgery | Admitting: Plastic Surgery

## 2018-07-10 DIAGNOSIS — Z1159 Encounter for screening for other viral diseases: Secondary | ICD-10-CM | POA: Insufficient documentation

## 2018-07-10 DIAGNOSIS — Z01812 Encounter for preprocedural laboratory examination: Secondary | ICD-10-CM | POA: Diagnosis not present

## 2018-07-10 NOTE — Progress Notes (Signed)
Pt given ensure drink and hibiclins scrub with instructions. NPO otherwise DOS. Pt verbalized understanding and given time to ask questions

## 2018-07-11 LAB — NOVEL CORONAVIRUS, NAA (HOSP ORDER, SEND-OUT TO REF LAB; TAT 18-24 HRS): SARS-CoV-2, NAA: NOT DETECTED

## 2018-07-13 NOTE — Anesthesia Preprocedure Evaluation (Addendum)
Anesthesia Evaluation  Patient identified by MRN, date of birth, ID band Patient awake    Reviewed: Allergy & Precautions, NPO status , Patient's Chart, lab work & pertinent test results  Airway Mallampati: II  TM Distance: >3 FB Neck ROM: Full    Dental no notable dental hx. (+) Teeth Intact   Pulmonary asthma , sleep apnea ,    Pulmonary exam normal breath sounds clear to auscultation       Cardiovascular negative cardio ROS Normal cardiovascular exam Rhythm:Regular Rate:Normal     Neuro/Psych  Headaches, Seizures -,  Anxiety Depression    GI/Hepatic Neg liver ROS, GERD  ,  Endo/Other  Hypothyroidism   Renal/GU negative Renal ROS     Musculoskeletal   Abdominal (+) + obese,   Peds  Hematology negative hematology ROS (+)   Anesthesia Other Findings   Reproductive/Obstetrics                            Anesthesia Physical Anesthesia Plan  ASA: II  Anesthesia Plan: General   Post-op Pain Management:    Induction: Intravenous  PONV Risk Score and Plan: 3 and Treatment may vary due to age or medical condition, Ondansetron and Dexamethasone  Airway Management Planned: LMA  Additional Equipment:   Intra-op Plan:   Post-operative Plan:   Informed Consent: I have reviewed the patients History and Physical, chart, labs and discussed the procedure including the risks, benefits and alternatives for the proposed anesthesia with the patient or authorized representative who has indicated his/her understanding and acceptance.     Dental advisory given  Plan Discussed with: CRNA  Anesthesia Plan Comments:        Anesthesia Quick Evaluation

## 2018-07-14 ENCOUNTER — Encounter (HOSPITAL_BASED_OUTPATIENT_CLINIC_OR_DEPARTMENT_OTHER)
Admission: RE | Disposition: A | Discharge status: Home / Self Care | Payer: Self-pay | Source: Home / Self Care | Attending: Plastic Surgery

## 2018-07-14 ENCOUNTER — Ambulatory Visit (HOSPITAL_BASED_OUTPATIENT_CLINIC_OR_DEPARTMENT_OTHER)
Admission: RE | Admit: 2018-07-14 | Discharge: 2018-07-14 | Disposition: A | Discharge status: Home / Self Care | Payer: 59 | Attending: Plastic Surgery | Admitting: Plastic Surgery

## 2018-07-14 ENCOUNTER — Encounter (HOSPITAL_BASED_OUTPATIENT_CLINIC_OR_DEPARTMENT_OTHER): Payer: Self-pay | Admitting: Certified Registered"

## 2018-07-14 ENCOUNTER — Ambulatory Visit (HOSPITAL_BASED_OUTPATIENT_CLINIC_OR_DEPARTMENT_OTHER): Payer: 59 | Admitting: Anesthesiology

## 2018-07-14 ENCOUNTER — Other Ambulatory Visit: Payer: Self-pay

## 2018-07-14 DIAGNOSIS — Z79811 Long term (current) use of aromatase inhibitors: Secondary | ICD-10-CM | POA: Diagnosis not present

## 2018-07-14 DIAGNOSIS — R51 Headache: Secondary | ICD-10-CM | POA: Diagnosis not present

## 2018-07-14 DIAGNOSIS — K219 Gastro-esophageal reflux disease without esophagitis: Secondary | ICD-10-CM | POA: Insufficient documentation

## 2018-07-14 DIAGNOSIS — N6489 Other specified disorders of breast: Secondary | ICD-10-CM | POA: Diagnosis not present

## 2018-07-14 DIAGNOSIS — Z421 Encounter for breast reconstruction following mastectomy: Secondary | ICD-10-CM | POA: Diagnosis present

## 2018-07-14 DIAGNOSIS — G4730 Sleep apnea, unspecified: Secondary | ICD-10-CM | POA: Diagnosis not present

## 2018-07-14 DIAGNOSIS — F419 Anxiety disorder, unspecified: Secondary | ICD-10-CM | POA: Diagnosis not present

## 2018-07-14 DIAGNOSIS — R569 Unspecified convulsions: Secondary | ICD-10-CM | POA: Insufficient documentation

## 2018-07-14 DIAGNOSIS — Z6839 Body mass index (BMI) 39.0-39.9, adult: Secondary | ICD-10-CM | POA: Diagnosis not present

## 2018-07-14 DIAGNOSIS — J45909 Unspecified asthma, uncomplicated: Secondary | ICD-10-CM | POA: Diagnosis not present

## 2018-07-14 DIAGNOSIS — F329 Major depressive disorder, single episode, unspecified: Secondary | ICD-10-CM | POA: Insufficient documentation

## 2018-07-14 DIAGNOSIS — E669 Obesity, unspecified: Secondary | ICD-10-CM | POA: Diagnosis not present

## 2018-07-14 DIAGNOSIS — G4733 Obstructive sleep apnea (adult) (pediatric): Secondary | ICD-10-CM | POA: Insufficient documentation

## 2018-07-14 DIAGNOSIS — Z170 Estrogen receptor positive status [ER+]: Secondary | ICD-10-CM | POA: Diagnosis not present

## 2018-07-14 DIAGNOSIS — Z853 Personal history of malignant neoplasm of breast: Secondary | ICD-10-CM | POA: Insufficient documentation

## 2018-07-14 DIAGNOSIS — E039 Hypothyroidism, unspecified: Secondary | ICD-10-CM | POA: Insufficient documentation

## 2018-07-14 HISTORY — PX: REMOVAL OF TISSUE EXPANDER AND PLACEMENT OF IMPLANT: SHX6457

## 2018-07-14 SURGERY — REMOVAL, TISSUE EXPANDER, BREAST, WITH IMPLANT INSERTION
Anesthesia: General | Site: Chest | Laterality: Left

## 2018-07-14 MED ORDER — ACETAMINOPHEN 500 MG PO TABS: 1000.0000 mg | ORAL_TABLET | ORAL | Status: DC

## 2018-07-14 MED ORDER — CHLORHEXIDINE GLUCONATE CLOTH 2 % EX PADS: 6.0000 | MEDICATED_PAD | Freq: Once | CUTANEOUS | Status: DC

## 2018-07-14 MED ORDER — GABAPENTIN 300 MG PO CAPS
ORAL_CAPSULE | ORAL | Status: AC
  Filled 2018-07-14: qty 1

## 2018-07-14 MED ORDER — CEFAZOLIN SODIUM-DEXTROSE 2-4 GM/100ML-% IV SOLN
2.0000 g | INTRAVENOUS | Status: AC
  Administered 2018-07-14: 2 g via INTRAVENOUS

## 2018-07-14 MED ORDER — SCOPOLAMINE 1 MG/3DAYS TD PT72: 1.0000 | MEDICATED_PATCH | Freq: Once | TRANSDERMAL | Status: DC | PRN

## 2018-07-14 MED ORDER — ACETAMINOPHEN 500 MG PO TABS
1000.0000 mg | ORAL_TABLET | Freq: Once | ORAL | Status: AC
  Administered 2018-07-14: 09:00:00 1000 mg via ORAL

## 2018-07-14 MED ORDER — ONDANSETRON HCL 4 MG/2ML IJ SOLN
INTRAMUSCULAR | Status: DC | PRN
  Administered 2018-07-14: 4 mg via INTRAVENOUS

## 2018-07-14 MED ORDER — HYDROMORPHONE HCL 1 MG/ML IJ SOLN
0.2500 mg | INTRAMUSCULAR | Status: DC | PRN
  Administered 2018-07-14: 0.5 mg via INTRAVENOUS

## 2018-07-14 MED ORDER — CELECOXIB 200 MG PO CAPS
ORAL_CAPSULE | ORAL | Status: AC
  Filled 2018-07-14: qty 1

## 2018-07-14 MED ORDER — PROPOFOL 10 MG/ML IV BOLUS
INTRAVENOUS | Status: AC
  Filled 2018-07-14: qty 20

## 2018-07-14 MED ORDER — MIDAZOLAM HCL 2 MG/2ML IJ SOLN: 1.0000 mg | INTRAMUSCULAR | Status: DC | PRN

## 2018-07-14 MED ORDER — ONDANSETRON HCL 4 MG/2ML IJ SOLN: 4.0000 mg | Freq: Once | INTRAMUSCULAR | Status: DC | PRN

## 2018-07-14 MED ORDER — CELECOXIB 200 MG PO CAPS
200.0000 mg | ORAL_CAPSULE | ORAL | Status: AC
  Administered 2018-07-14: 09:00:00 200 mg via ORAL

## 2018-07-14 MED ORDER — SUCCINYLCHOLINE CHLORIDE 200 MG/10ML IV SOSY
PREFILLED_SYRINGE | INTRAVENOUS | Status: AC
  Filled 2018-07-14: qty 10

## 2018-07-14 MED ORDER — OXYCODONE HCL 5 MG PO TABS
ORAL_TABLET | ORAL | Status: AC
  Filled 2018-07-14: qty 1

## 2018-07-14 MED ORDER — ACETAMINOPHEN 500 MG PO TABS
ORAL_TABLET | ORAL | Status: AC
  Filled 2018-07-14: qty 2

## 2018-07-14 MED ORDER — GABAPENTIN 300 MG PO CAPS
300.0000 mg | ORAL_CAPSULE | ORAL | Status: AC
  Administered 2018-07-14: 300 mg via ORAL

## 2018-07-14 MED ORDER — CEFAZOLIN SODIUM-DEXTROSE 2-4 GM/100ML-% IV SOLN
INTRAVENOUS | Status: AC
  Filled 2018-07-14: qty 100

## 2018-07-14 MED ORDER — ONDANSETRON HCL 4 MG/2ML IJ SOLN
INTRAMUSCULAR | Status: AC
  Filled 2018-07-14: qty 2

## 2018-07-14 MED ORDER — FENTANYL CITRATE (PF) 100 MCG/2ML IJ SOLN: 50.0000 ug | INTRAMUSCULAR | Status: DC | PRN

## 2018-07-14 MED ORDER — PROPOFOL 10 MG/ML IV BOLUS
INTRAVENOUS | Status: DC | PRN
  Administered 2018-07-14: 200 mg via INTRAVENOUS

## 2018-07-14 MED ORDER — LIDOCAINE 2% (20 MG/ML) 5 ML SYRINGE
INTRAMUSCULAR | Status: DC | PRN
  Administered 2018-07-14: 100 mg via INTRAVENOUS

## 2018-07-14 MED ORDER — OXYCODONE HCL 5 MG/5ML PO SOLN: 5.0000 mg | Freq: Once | ORAL | Status: AC | PRN

## 2018-07-14 MED ORDER — DEXAMETHASONE SODIUM PHOSPHATE 10 MG/ML IJ SOLN
INTRAMUSCULAR | Status: AC
  Filled 2018-07-14: qty 1

## 2018-07-14 MED ORDER — LACTATED RINGERS IV SOLN
INTRAVENOUS | Status: DC
  Administered 2018-07-14: 09:00:00 via INTRAVENOUS

## 2018-07-14 MED ORDER — OXYCODONE HCL 5 MG PO TABS
5.0000 mg | ORAL_TABLET | Freq: Once | ORAL | Status: AC | PRN
  Administered 2018-07-14: 13:00:00 5 mg via ORAL

## 2018-07-14 MED ORDER — FENTANYL CITRATE (PF) 250 MCG/5ML IJ SOLN
INTRAMUSCULAR | Status: DC | PRN
  Administered 2018-07-14: 100 ug via INTRAVENOUS

## 2018-07-14 MED ORDER — DEXAMETHASONE SODIUM PHOSPHATE 10 MG/ML IJ SOLN
INTRAMUSCULAR | Status: DC | PRN
  Administered 2018-07-14: 5 mg via INTRAVENOUS

## 2018-07-14 MED ORDER — HYDROMORPHONE HCL 1 MG/ML IJ SOLN
INTRAMUSCULAR | Status: AC
  Filled 2018-07-14: qty 1

## 2018-07-14 MED ORDER — MEPERIDINE HCL 25 MG/ML IJ SOLN: 6.2500 mg | INTRAMUSCULAR | Status: DC | PRN

## 2018-07-14 MED ORDER — FENTANYL CITRATE (PF) 100 MCG/2ML IJ SOLN
INTRAMUSCULAR | Status: AC
  Filled 2018-07-14: qty 2

## 2018-07-14 MED ORDER — SODIUM CHLORIDE 0.9 % IV SOLN
INTRAVENOUS | Status: DC | PRN
  Administered 2018-07-14: 500 mL

## 2018-07-14 SURGICAL SUPPLY — 88 items
BAG DECANTER FOR FLEXI CONT (MISCELLANEOUS) ×3 IMPLANT
BINDER ABDOMINAL 10 UNV 27-48 (MISCELLANEOUS) IMPLANT
BINDER ABDOMINAL 12 SM 30-45 (SOFTGOODS) IMPLANT
BINDER BREAST 3XL (GAUZE/BANDAGES/DRESSINGS) ×1 IMPLANT
BINDER BREAST XLRG (GAUZE/BANDAGES/DRESSINGS) IMPLANT
BINDER BREAST XXLRG (GAUZE/BANDAGES/DRESSINGS) IMPLANT
BLADE SURG 10 STRL SS (BLADE) ×3 IMPLANT
BLADE SURG 11 STRL SS (BLADE) ×2 IMPLANT
BLADE SURG 15 STRL LF DISP TIS (BLADE) IMPLANT
BLADE SURG 15 STRL SS (BLADE) ×1
BNDG GAUZE ELAST 4 BULKY (GAUZE/BANDAGES/DRESSINGS) ×6 IMPLANT
CANISTER LIPO FAT HARVEST (MISCELLANEOUS) IMPLANT
CANISTER SUCT 1200ML W/VALVE (MISCELLANEOUS) ×3 IMPLANT
CHLORAPREP W/TINT 26 (MISCELLANEOUS) ×3 IMPLANT
COVER BACK TABLE REUSABLE LG (DRAPES) ×3 IMPLANT
COVER MAYO STAND REUSABLE (DRAPES) ×6 IMPLANT
COVER WAND RF STERILE (DRAPES) IMPLANT
DECANTER SPIKE VIAL GLASS SM (MISCELLANEOUS) IMPLANT
DERMABOND ADVANCED (GAUZE/BANDAGES/DRESSINGS) ×2
DERMABOND ADVANCED .7 DNX12 (GAUZE/BANDAGES/DRESSINGS) ×4 IMPLANT
DRAIN CHANNEL 15F RND FF W/TCR (WOUND CARE) IMPLANT
DRAPE TOP ARMCOVERS (MISCELLANEOUS) ×3 IMPLANT
DRAPE U-SHAPE 76X120 STRL (DRAPES) ×3 IMPLANT
DRAPE UTILITY XL STRL (DRAPES) ×3 IMPLANT
DRSG PAD ABDOMINAL 8X10 ST (GAUZE/BANDAGES/DRESSINGS) ×6 IMPLANT
ELECT BLADE 4.0 EZ CLEAN MEGAD (MISCELLANEOUS)
ELECT COATED BLADE 2.86 ST (ELECTRODE) ×3 IMPLANT
ELECT REM PT RETURN 9FT ADLT (ELECTROSURGICAL) ×3
ELECTRODE BLDE 4.0 EZ CLN MEGD (MISCELLANEOUS) ×2 IMPLANT
ELECTRODE REM PT RTRN 9FT ADLT (ELECTROSURGICAL) ×2 IMPLANT
EVACUATOR SILICONE 100CC (DRAIN) IMPLANT
EXTRACTOR CANIST REVOLVE STRL (CANNISTER) IMPLANT
GLOVE BIO SURGEON STRL SZ 6 (GLOVE) ×7 IMPLANT
GLOVE BIOGEL PI IND STRL 7.0 (GLOVE) IMPLANT
GLOVE BIOGEL PI INDICATOR 7.0 (GLOVE) ×2
GLOVE ECLIPSE 6.5 STRL STRAW (GLOVE) ×1 IMPLANT
GOWN STRL REUS W/ TWL LRG LVL3 (GOWN DISPOSABLE) ×4 IMPLANT
GOWN STRL REUS W/TWL LRG LVL3 (GOWN DISPOSABLE) ×2
IMPL BREAST FULL RND 415 (Breast) IMPLANT
IMPL BRST FULL RND 415CC (Breast) ×2 IMPLANT
IMPLANT BREAST GEL 415CC (Breast) ×1 IMPLANT
IV NS 500ML (IV SOLUTION)
IV NS 500ML BAXH (IV SOLUTION) ×2 IMPLANT
KIT FILL SYSTEM UNIVERSAL (SET/KITS/TRAYS/PACK) IMPLANT
LINER CANISTER 1000CC FLEX (MISCELLANEOUS) ×2 IMPLANT
MARKER SKIN DUAL TIP RULER LAB (MISCELLANEOUS) IMPLANT
NDL FILTER BLUNT 18X1 1/2 (NEEDLE) IMPLANT
NDL HYPO 25X1 1.5 SAFETY (NEEDLE) IMPLANT
NDL SAFETY ECLIPSE 18X1.5 (NEEDLE) ×2 IMPLANT
NEEDLE FILTER BLUNT 18X 1/2SAF (NEEDLE) ×1
NEEDLE FILTER BLUNT 18X1 1/2 (NEEDLE) ×2 IMPLANT
NEEDLE HYPO 18GX1.5 SHARP (NEEDLE) ×1
NEEDLE HYPO 25X1 1.5 SAFETY (NEEDLE) IMPLANT
NS IRRIG 1000ML POUR BTL (IV SOLUTION) ×1 IMPLANT
PACK BASIN DAY SURGERY FS (CUSTOM PROCEDURE TRAY) ×3 IMPLANT
PAD ALCOHOL SWAB (MISCELLANEOUS) ×2 IMPLANT
PENCIL BUTTON HOLSTER BLD 10FT (ELECTRODE) ×3 IMPLANT
PIN SAFETY STERILE (MISCELLANEOUS) IMPLANT
SHEET MEDIUM DRAPE 40X70 STRL (DRAPES) ×6 IMPLANT
SIZER BREAST REUSE 415CC (SIZER) ×1
SIZER BREAST REUSE SRF 450CC (SIZER) ×3
SIZER BRST REUSE P5.1X 415CC (SIZER) IMPLANT
SIZER BRST REUSE SRF 450CC (SIZER) IMPLANT
SLEEVE SCD COMPRESS KNEE MED (MISCELLANEOUS) ×3 IMPLANT
SPONGE LAP 18X18 RF (DISPOSABLE) ×6 IMPLANT
STAPLER VISISTAT 35W (STAPLE) ×3 IMPLANT
SUT ETHILON 2 0 FS 18 (SUTURE) IMPLANT
SUT MNCRL AB 4-0 PS2 18 (SUTURE) ×3 IMPLANT
SUT PDS AB 2-0 CT2 27 (SUTURE) IMPLANT
SUT SILK 2 0 SH (SUTURE) IMPLANT
SUT VIC AB 3-0 PS1 18 (SUTURE)
SUT VIC AB 3-0 PS1 18XBRD (SUTURE) IMPLANT
SUT VIC AB 3-0 SH 27 (SUTURE) ×1
SUT VIC AB 3-0 SH 27X BRD (SUTURE) ×2 IMPLANT
SUT VICRYL 4-0 PS2 18IN ABS (SUTURE) ×3 IMPLANT
SYR 10ML LL (SYRINGE) ×6 IMPLANT
SYR 20CC LL (SYRINGE) IMPLANT
SYR 50ML LL SCALE MARK (SYRINGE) ×2 IMPLANT
SYR BULB IRRIGATION 50ML (SYRINGE) ×6 IMPLANT
SYR CONTROL 10ML LL (SYRINGE) IMPLANT
SYR TB 1ML LL NO SAFETY (SYRINGE) ×2 IMPLANT
SYRINGE TOOMEY DISP (SYRINGE) IMPLANT
TOWEL GREEN STERILE FF (TOWEL DISPOSABLE) ×6 IMPLANT
TUBE CONNECTING 20X1/4 (TUBING) ×6 IMPLANT
TUBING INFILTRATION IT-10001 (TUBING) ×2 IMPLANT
TUBING SET GRADUATE ASPIR 12FT (MISCELLANEOUS) ×2 IMPLANT
UNDERPAD 30X30 (UNDERPADS AND DIAPERS) ×6 IMPLANT
YANKAUER SUCT BULB TIP NO VENT (SUCTIONS) ×3 IMPLANT

## 2018-07-14 NOTE — Anesthesia Procedure Notes (Signed)
Procedure Name: LMA Insertion Date/Time: 07/14/2018 9:47 AM Performed by: Myna Bright, CRNA Pre-anesthesia Checklist: Patient identified, Emergency Drugs available, Suction available and Patient being monitored Patient Re-evaluated:Patient Re-evaluated prior to induction Oxygen Delivery Method: Circle system utilized Preoxygenation: Pre-oxygenation with 100% oxygen Induction Type: IV induction Ventilation: Mask ventilation without difficulty LMA: LMA inserted LMA Size: 4.0 Tube type: Oral Placement Confirmation: positive ETCO2 and breath sounds checked- equal and bilateral Tube secured with: Tape Dental Injury: Teeth and Oropharynx as per pre-operative assessment

## 2018-07-14 NOTE — Op Note (Signed)
Operative Note   DATE OF OPERATION: 6.16.20  LOCATION: [facility]-outpatient  SURGICAL DIVISION: Plastic Surgery  PREOPERATIVE DIAGNOSES:  1. History breast cancer 2. Acquired absence breast  POSTOPERATIVE DIAGNOSES:  same  PROCEDURE:  Removal left chest tissue expander and placement silicone implant  SURGEON: Irene Limbo MD MBA  ASSISTANT: none  ANESTHESIA:  General.   EBL: 25 ml  COMPLICATIONS: None immediate.   INDICATIONS FOR PROCEDURE:  The patient, Angela [name], is a 63 y.o. female born on 06-16-1955, is here for staged breast reconstruction following left nipple sparing mastectomy with prepectoral expander acellular dermis reconstruction.   FINDINGS: Complete incorporation ADM noted. Natrelle Inspira Full Projection Smooth Round 415 ml implant placed, REF [id number]  DESCRIPTION OF PROCEDURE:  The patient's operative site was marked with the patient in the preoperative area. The patient was taken to the operating room. SCDs were placed and IV antibiotics were given. The patient's operative site was prepped and draped in a sterile fashion. A time out was performed and all information was confirmed to be correct. Incision made in left chest inframammary fold scar and carried through superficial fascia and acellular dermis. Expander removed. Capsulotomies performed superiorly. Sizer placed. Patient brought to upright sitting position and assessed for symmetry. Natrelle Full Projection 415 ml implant selected. Patient returned to supine position and cavity irrigated with saline solution containing Ancef, gentamicin, and bacitracin. Hemostasis ensured. Cavity irrigated with saline Betadine solution. Implant placed in cavity and orientation implant ensured. Closure completed with 3-0 vicryl for approximation ADM and superficial fascia. 4-0 vicryl used to close dermis and skin closure completed with 4-0 monocryl subcuticular. Dermabond applied. Dry dressing and  breast binder applied.   The patient was allowed to wake from anesthesia, extubated and taken to the recovery room in satisfactory condition.   SPECIMENS: none  DRAINS: none  Irene Limbo, MD [facility] Plastic & Reconstructive Surgery [phone], pin [phone]

## 2018-07-14 NOTE — Discharge Instructions (Signed)

## 2018-07-14 NOTE — Interval H&P Note (Signed)
History and Physical Interval Note:  07/14/2018 8:57 AM  Angela [name]  has presented today for surgery, with the diagnosis of history breast ca, acquired absence breast left.  The various methods of treatment have been discussed with the patient and family. After consideration of risks, benefits and other options for treatment, the patient has consented to  Removal left chest tissue expander and placement silicone implant as a surgical intervention.  The patient's history has been reviewed, patient examined, no change in status, stable for surgery.  I have reviewed the patient's chart and labs.  Questions were answered to the patient's satisfaction.     Arnoldo Hooker Angela [name]

## 2018-07-14 NOTE — Transfer of Care (Signed)
Immediate Anesthesia Transfer of Care Note  Patient: Angela [name]  Procedure(s) Performed: REMOVAL OF  LEFT TISSUE EXPANDER AND PLACEMENT OF IMPLANT (Left Breast)  Patient Location: PACU  Anesthesia Type:General  Level of Consciousness: awake, alert , oriented and patient cooperative  Airway & Oxygen Therapy: Patient Spontanous Breathing and Patient connected to face mask oxygen  Post-op Assessment: Report given to RN, Post -op Vital signs reviewed and stable and Patient moving all extremities  Post vital signs: Reviewed and stable  Last Vitals:  Vitals Value Taken Time  BP 143/98 07/14/18 1045  Temp    Pulse 81 07/14/18 1045  Resp 19 07/14/18 1045  SpO2 100 % 07/14/18 1045  Vitals shown include unvalidated device data.  Last Pain:  Vitals:   07/14/18 0905  TempSrc: Oral  PainSc: 0-No pain         Complications: No apparent anesthesia complications

## 2018-07-15 ENCOUNTER — Encounter (HOSPITAL_BASED_OUTPATIENT_CLINIC_OR_DEPARTMENT_OTHER): Payer: Self-pay | Admitting: Plastic Surgery

## 2018-07-15 NOTE — Anesthesia Postprocedure Evaluation (Signed)
Anesthesia Post Note  Patient: Angela [name]  Procedure(s) Performed: REMOVAL OF  LEFT TISSUE EXPANDER AND PLACEMENT OF IMPLANT (Left Breast)     Patient location during evaluation: PACU Anesthesia Type: General Level of consciousness: awake and alert Pain management: pain level controlled Vital Signs Assessment: post-procedure vital signs reviewed and stable Respiratory status: spontaneous breathing, nonlabored ventilation, respiratory function stable and patient connected to nasal cannula oxygen Cardiovascular status: blood pressure returned to baseline and stable Postop Assessment: no apparent nausea or vomiting Anesthetic complications: no    Last Vitals:  Vitals:   07/14/18 1215 07/14/18 1240  BP: 127/90 (!) 132/95  Pulse: 63 63  Resp: 11 16  Temp:  36.8 C  SpO2: 97% 98%    Last Pain:  Vitals:   07/15/18 0947  TempSrc:   PainSc: 1                  Barnet Glasgow

## 2018-07-29 ENCOUNTER — Telehealth: Payer: Self-pay | Admitting: Adult Health

## 2018-07-29 NOTE — Telephone Encounter (Signed)
Called patient regarding upcoming webex appointment, patient is notified and e-mail has been sent.

## 2018-07-29 NOTE — Telephone Encounter (Signed)
Left message for patient webex for pre reg

## 2018-07-30 ENCOUNTER — Inpatient Hospital Stay: Payer: 59 | Attending: Adult Health | Admitting: Adult Health

## 2018-07-30 ENCOUNTER — Telehealth: Payer: Self-pay | Admitting: Hematology and Oncology

## 2018-07-30 ENCOUNTER — Encounter: Payer: Self-pay | Admitting: Adult Health

## 2018-07-30 DIAGNOSIS — Z9012 Acquired absence of left breast and nipple: Secondary | ICD-10-CM | POA: Diagnosis not present

## 2018-07-30 DIAGNOSIS — Z79811 Long term (current) use of aromatase inhibitors: Secondary | ICD-10-CM

## 2018-07-30 DIAGNOSIS — Z170 Estrogen receptor positive status [ER+]: Secondary | ICD-10-CM

## 2018-07-30 DIAGNOSIS — C50312 Malignant neoplasm of lower-inner quadrant of left female breast: Secondary | ICD-10-CM

## 2018-07-30 DIAGNOSIS — E2839 Other primary ovarian failure: Secondary | ICD-10-CM

## 2018-07-30 NOTE — Telephone Encounter (Signed)
I left a message regarding schedule I will mail °

## 2018-07-30 NOTE — Progress Notes (Signed)
SURVIVORSHIP VIRTUAL VISIT:  I connected with Angela [name] on 07/30/18 at 10:00 AM EDT by webex and verified that I am speaking with the correct person using two identifiers.   I discussed the limitations, risks, security and privacy concerns of performing an evaluation and management service by telephone/video and the availability of in person appointments. I also discussed with the patient that there may be a patient responsible charge related to this service. The patient expressed understanding and agreed to proceed.   BRIEF ONCOLOGIC HISTORY:  Oncology History  Malignant neoplasm of lower-inner quadrant of left breast in female, estrogen receptor positive ([place])  07/14/2017 Initial Diagnosis   Screening detected left breast asymmetry at 6 o'clock position, could not be measured accurately.  Axillary ultrasound was not performed.  Biopsy of the asymmetry revealed IDC grade 1, ER 95%, PR 60%, Ki-67 2%, HER-2 negative ratio 1.52, T1 NX stage Ia probable clinical stage   07/30/2017 Breast MRI   Biopsy-proven left breast 4.4 cm with non-mass enhancement overall extent 5.5 cm.  Additional clumped non-mass enhancement left breast 2 sites middle depth anterior depth suspicious for multifocal disease overall extent 8 cm, 5 morphologically abnormal lymph nodes in the left axilla 2 prominent lymph nodes in the right axilla   08/15/2017 Procedure   Left breast biopsy anterior anterior and lower outer quadrants DCIS high-grade; left axillary lymph node biopsy benign fatty tissue no lymph node tissue present   08/27/2017 -  Anti-estrogen oral therapy   Letrozole 2.5 mg daily   01/06/2018 Surgery   Left mastectomy with reconstruction on 01/06/2018: IDC, 3 cm, margins negative, 0/1 lymph node negative, grade 2, ER 100%, PR 0%, Ki-67 3%   01/06/2018 Oncotype testing   12/3%     INTERVAL HISTORY:  Angela [name] to review her survivorship care plan detailing her treatment course for breast cancer, as well as  monitoring long-term side effects of that treatment, education regarding health maintenance, screening, and overall wellness and health promotion.     Overall, Angela [name] reports feeling quite well.  Since her last visit she underwent screening mammogram in her right breast on 07/09/2018 that was negative and showed breast density category C.  She is taking Letrozole at night.  She denies any side effects form taking this.  REVIEW OF SYSTEMS:  Review of Systems  Constitutional: Negative for appetite change, chills, fatigue, fever and unexpected weight change.  HENT:   Negative for hearing loss, lump/mass, sore throat and trouble swallowing.   Eyes: Negative for eye problems and icterus.  Respiratory: Negative for chest tightness, cough and shortness of breath.   Cardiovascular: Negative for chest pain, leg swelling and palpitations.  Gastrointestinal: Negative for abdominal distention, abdominal pain, constipation, diarrhea, nausea and vomiting.  Endocrine: Negative for hot flashes.  Genitourinary: Negative for difficulty urinating.   Musculoskeletal: Negative for arthralgias.  Skin: Negative for itching and rash.  Neurological: Negative for dizziness, extremity weakness, headaches and numbness.  Hematological: Negative for adenopathy. Does not bruise/bleed easily.  Psychiatric/Behavioral: Negative for depression. The patient is not nervous/anxious.   Breast: Denies any new nodularity, masses, tenderness, nipple changes, or nipple discharge.      ONCOLOGY TREATMENT TEAM:  1. Surgeon:  Dr. Donne Hazel at [facility] 2. Medical Oncologist: Dr. Lindi Adie  3. Radiation Oncologist: Dr. Sondra Come    PAST MEDICAL/SURGICAL HISTORY:  Past Medical History:  Diagnosis Date   Anxiety    Asthma    Breast cancer ([place]) 07/2017   Left Breast Cancer  Breast discharge    Congestion of right ear    Depression    GERD (gastroesophageal reflux disease)    Headache(784.0)     Hyperlipidemia    Hypothyroidism    Seizures ([facility])    hx of during childhood -not since age 33   Sleep apnea    uses CPAP   Thyroid disease    Tinnitus    Urticaria    Past Surgical History:  Procedure Laterality Date   ANTERIOR AND POSTERIOR REPAIR  2007   BREAST DUCTAL SYSTEM EXCISION  01/18/2011   Procedure: EXCISION DUCTAL SYSTEM BREAST;  Surgeon: Belva Crome, MD;  Location: [place];  Service: General;  Laterality: Right;   BREAST EXCISIONAL BIOPSY Right    BREAST RECONSTRUCTION WITH PLACEMENT OF TISSUE EXPANDER AND ALLODERM Left 01/06/2018   Procedure: BREAST RECONSTRUCTION WITH PLACEMENT OF TISSUE EXPANDER AND ALLODERM;  Surgeon: Irene Limbo, MD;  Location: [place];  Service: Plastics;  Laterality: Left;   BREAST SURGERY  01/18/2011   Right BX   CYSTOSCOPY WITH URETHROLYSIS N/A 11/01/2014   Procedure: CYSTOSCOPY WITH URETHROLYSIS;  Surgeon: Bjorn Loser, MD;  Location: [facility];  Service: Urology;  Laterality: N/A;   DILATION AND CURETTAGE OF UTERUS     MASTECTOMY Left 12/2017   PUBOVAGINAL SLING N/A 11/01/2014   Procedure: REMOVAL VAGINAL SLING AND URETHROLYSIS AND CYSTO;  Surgeon: Bjorn Loser, MD;  Location: [facility];  Service: Urology;  Laterality: N/A;   REMOVAL OF TISSUE EXPANDER AND PLACEMENT OF IMPLANT Left 07/14/2018   Procedure: REMOVAL OF  LEFT TISSUE EXPANDER AND PLACEMENT OF IMPLANT;  Surgeon: Irene Limbo, MD;  Location: [place];  Service: Plastics;  Laterality: Left;   TUBAL LIGATION  1998   TYMPANOSTOMY TUBE PLACEMENT  2010   right ear    WISDOM TOOTH EXTRACTION       ALLERGIES:  Allergies  Allergen Reactions   Other Other (See Comments)    NO BLOOD PRODUCTS - PT IN AGREEMENT FOR ALBUMIN OR ALBUMIN CONTAINING PRODUCTS PER CONSENT   Aspirin Other (See Comments)    Stomach ulcers    Ibuprofen Other (See Comments)    Cautious by MD due to kidney function       CURRENT MEDICATIONS:  Outpatient Encounter Medications as of 07/30/2018  Medication Sig   albuterol (PROVENTIL) (2.5 MG/3ML) 0.083% nebulizer solution Take 2.5 mg by nebulization every 6 (six) hours as needed for wheezing or shortness of breath.   Albuterol Sulfate (PROAIR RESPICLICK) 893 (90 Base) MCG/ACT AEPB Inhale 2 puffs into the lungs every 4 (four) hours as needed. (Patient taking differently: Inhale 2 puffs into the lungs every 6 (six) hours as needed (wheezing/shortness of breath). )   diphenhydrAMINE (BENADRYL) 25 MG tablet Take 25 mg by mouth every 6 (six) hours as needed for itching or allergies.   letrozole (FEMARA) 2.5 MG tablet TAKE 1 TABLET BY MOUTH EVERY DAY   levothyroxine (SYNTHROID, LEVOTHROID) 125 MCG tablet Take 125 mcg by mouth daily before breakfast.   loratadine (CLARITIN) 10 MG tablet Take 10 mg by mouth daily.   methocarbamol (ROBAXIN) 500 MG tablet Take 1 tablet (500 mg total) by mouth every 8 (eight) hours as needed for muscle spasms.   montelukast (SINGULAIR) 10 MG tablet Take 10 mg by mouth at bedtime.   Multiple Vitamin (MULTIVITAMIN) tablet Take 1 tablet by mouth daily.   triamcinolone (NASACORT AQ) 55 MCG/ACT AERO nasal inhaler Place 1 spray into the nose  at bedtime.    No facility-administered encounter medications on file as of 07/30/2018.      ONCOLOGIC FAMILY HISTORY:  Family History  Problem Relation Age of Onset   Cancer Father        throat   Cancer Maternal Aunt        breast   Breast cancer Maternal Aunt        in 7's   Allergic rhinitis Neg Hx    Asthma Neg Hx    Eczema Neg Hx    Immunodeficiency Neg Hx    Urticaria Neg Hx      GENETIC COUNSELING/TESTING: Not at this time  SOCIAL HISTORY:  Social History   Socioeconomic History   Marital status: Single    Spouse name: Not on file   Number of children: Not on file   Years of education: Not on file   Highest education level: Not on file  Occupational  History   Not on file  Social Needs   Financial resource strain: Not on file   Food insecurity    Worry: Not on file    Inability: Not on file   Transportation needs    Medical: Not on file    Non-medical: Not on file  Tobacco Use   Smoking status: Never Smoker   Smokeless tobacco: Never Used  Substance and Sexual Activity   Alcohol use: No   Drug use: No   Sexual activity: Never    Birth control/protection: Post-menopausal    Comment: meno  Lifestyle   Physical activity    Days per week: Not on file    Minutes per session: Not on file   Stress: Not on file  Relationships   Social connections    Talks on phone: Not on file    Gets together: Not on file    Attends religious service: Not on file    Active member of club or organization: Not on file    Attends meetings of clubs or organizations: Not on file    Relationship status: Not on file   Intimate partner violence    Fear of current or ex partner: Not on file    Emotionally abused: Not on file    Physically abused: Not on file    Forced sexual activity: Not on file  Other Topics Concern   Not on file  Social History Narrative   Not on file     OBSERVATIONS/OBJECTIVE:  Patient appears well.  No apparent distress.  Skin without rash or lesion.  Neuro focally intact.  Breathing is non labored.  Mood and behavior are normal.   LABORATORY DATA:  None for this visit.  DIAGNOSTIC IMAGING:       ASSESSMENT AND PLAN:  Ms.. [name] is a pleasant 63 y.o. female with Stage IIA left breast invasive ductal carcinoma, ER+/PR+/HER2-, diagnosed in 06/2017, treated with mastectomy, and anti-estrogen therapy with Letrozole beginning in 07/2017.  She presents to the Survivorship Clinic for our initial meeting and routine follow-up post-completion of treatment for breast cancer.    1. Stage IIA left breast cancer:  Angela [name] is continuing to recover from definitive treatment for breast cancer. She will follow-up  with her medical oncologist, Dr. Lindi Adie in 12/2018 with history and physical exam per surveillance protocol.  She will continue her anti-estrogen therapy with Letrozole. Thus far, she is tolerating the Letrozole well, with minimal side effects. She was instructed to make Dr. Lindi Adie or myself aware if she begins to experience  any worsening side effects of the medication and I could see her back in clinic to help manage those side effects, as needed. Her mammogram is due 06/2019.  Her breast density is category C.   Today, a comprehensive survivorship care plan and treatment summary was reviewed with the patient today detailing her breast cancer diagnosis, treatment course, potential late/long-term effects of treatment, appropriate follow-up care with recommendations for the future, and patient education resources.  A copy of this summary, along with a letter will be sent to the patients primary care provider via mail/fax/In Basket message after todays visit.    2. Bone health:  Given Angela [name]'s age/history of breast cancer and her current treatment regimen including anti-estrogen therapy with Letrozole, she is at risk for bone demineralization.  She has not undergone bone density testing, and I placed orders for this to be done.  In the meantime, she was encouraged to increase her consumption of foods rich in calcium, as well as increase her weight-bearing activities.  She was given education on specific activities to promote bone health.  3. Cancer screening:  Due to Angela [name]'s history and her age, she should receive screening for skin cancers, colon cancer, and gynecologic cancers.  The information and recommendations are listed on the patient's comprehensive care plan/treatment summary and were reviewed in detail with the patient.    4. Health maintenance and wellness promotion: Angela [name] was encouraged to consume 5-7 servings of fruits and vegetables per day. We reviewed the "Nutrition Rainbow"  handout, as well as the handout "Take Control of Your Health and Reduce Your Cancer Risk" from the [employer].  She was also encouraged to engage in moderate to vigorous exercise for 30 minutes per day most days of the week. We discussed the [employer] fitness program, which is designed for cancer survivors to help them become more physically fit after cancer treatments.  She was instructed to limit her alcohol consumption and continue to abstain from tobacco use.     5. Support services/counseling: It is not uncommon for this period of the patient's cancer care trajectory to be one of many emotions and stressors.  We discussed how this can be increasingly difficult during the times of quarantine and social distancing due to the COVID-19 pandemic.   She was given information regarding our available services and encouraged to contact me with any questions or for help enrolling in any of our support group/programs.    Follow up instructions:    -Return to cancer center for f/u with Dr. Lindi Adie in 12/2018 -Mammogram due in 06/2019 -Bone density ordered -Follow up with Dr. Donne Hazel in 06/2019 -She is welcome to return back to the Survivorship Clinic at any time; no additional follow-up needed at this time.  -Consider referral back to survivorship as a long-term survivor for continued surveillance  The patient was provided an opportunity to ask questions and all were answered. The patient agreed with the plan and demonstrated an understanding of the instructions.   The patient was advised to call back or seek an in-person evaluation if the symptoms worsen or if the condition fails to improve as anticipated.   I provided 25 minutes of face-to-face video visit time during this encounter, and > 50% was spent counseling as documented under my assessment & plan.  Scot Dock, NP

## 2018-09-08 DIAGNOSIS — Z9622 Myringotomy tube(s) status: Secondary | ICD-10-CM | POA: Insufficient documentation

## 2018-09-08 DIAGNOSIS — H9211 Otorrhea, right ear: Secondary | ICD-10-CM | POA: Insufficient documentation

## 2018-10-29 ENCOUNTER — Other Ambulatory Visit: Payer: 59

## 2019-01-07 ENCOUNTER — Other Ambulatory Visit: Payer: 59

## 2019-02-01 ENCOUNTER — Inpatient Hospital Stay: Payer: 59 | Attending: Hematology and Oncology | Admitting: Hematology and Oncology

## 2019-02-01 NOTE — Assessment & Plan Note (Deleted)
07/14/2017:Screening detected left breast asymmetry at 6 o'clock position, could not be measured accurately. Axillary ultrasound was not performed. Biopsy of the asymmetry revealed IDC grade 1, ER 95%, PR 60%, Ki-67 2%, HER-2 negative ratio 1.52, T1 NX stage Ia probable clinical stage  07/30/2017: Breast VEH:[id number] left breast 4.4 cm with non-mass enhancement overall extent 5.5 cm. Additional clumped non-mass enhancement left breast 2 sites middle depth anterior depth suspicious for multifocal disease overall extent 8 cm, 5 morphologically abnormal lymph nodes in the left axilla 2 prominent lymph nodes in the right axilla  Additional biopsies left breast showed DCIS Left axillary lymph node biopsy came back benign although there was no lymphoid tissue. Oncotype DX score 12: 3% risk of distant recurrence of 9 years, no chemo benefit  Current treatment: Adjuvant antiestrogen therapy with letrozole x7 years (letrozole was started neoadjuvantly in July 2019) Left mastectomy with reconstruction on 01/06/2018: IDC, 3 cm, margins negative, 0/1 lymph node negative, grade 2, ER 100%, PR 0%, Ki-67 3%  Letrozole toxicities: No major adverse effects to letrozole therapy.  Breast cancer surveillance: 1.  Right breast mammogram 07/10/2018: Benign breast density category C 2.  Breast exam 02/01/2019: Benign

## 2019-02-25 ENCOUNTER — Telehealth: Payer: Self-pay | Admitting: Hematology and Oncology

## 2019-02-25 NOTE — Telephone Encounter (Signed)
Rescheduled per 1/27 sch msg, pt req. Called and left a msg. Mailing printout

## 2019-03-08 ENCOUNTER — Inpatient Hospital Stay: Payer: 59 | Attending: Hematology and Oncology | Admitting: Hematology and Oncology

## 2019-03-08 ENCOUNTER — Other Ambulatory Visit: Payer: Self-pay

## 2019-03-08 DIAGNOSIS — C50312 Malignant neoplasm of lower-inner quadrant of left female breast: Secondary | ICD-10-CM | POA: Diagnosis present

## 2019-03-08 DIAGNOSIS — Z170 Estrogen receptor positive status [ER+]: Secondary | ICD-10-CM

## 2019-03-08 DIAGNOSIS — R21 Rash and other nonspecific skin eruption: Secondary | ICD-10-CM | POA: Diagnosis not present

## 2019-03-08 DIAGNOSIS — Z79811 Long term (current) use of aromatase inhibitors: Secondary | ICD-10-CM | POA: Insufficient documentation

## 2019-03-08 DIAGNOSIS — Z9012 Acquired absence of left breast and nipple: Secondary | ICD-10-CM | POA: Diagnosis not present

## 2019-03-08 MED ORDER — LETROZOLE 2.5 MG PO TABS: 2.5000 mg | ORAL_TABLET | Freq: Every day | ORAL | 3 refills | Status: DC

## 2019-03-08 NOTE — Assessment & Plan Note (Signed)
07/14/2017:Screening detected left breast asymmetry at 6 o'clock position, could not be measured accurately. Axillary ultrasound was not performed. Biopsy of the asymmetry revealed IDC grade 1, ER 95%, PR 60%, Ki-67 2%, HER-2 negative ratio 1.52, T1 NX stage Ia probable clinical stage  Additional biopsies left breast showed DCIS Left axillary lymph node biopsy came back benign although there was no lymphoid tissue. Oncotype DX score 12: 3% risk of distant recurrence of 9 years, no chemo benefit Left mastectomy with reconstruction on 01/06/2018: IDC, 3 cm, margins negative, 0/1 lymph node negative, grade 2, ER 100%, PR 0%, Ki-67 3%  Current treatment: Adjuvant antiestrogen therapy with letrozole (letrozole was started neoadjuvantly in July 2019) Letrozole toxicities:  Breast cancer surveillance: 1.  Breast exam: Benign 2.  Mammogram right breast: 07/10/2018: Benign breast density category C  Return to clinic in 1 year for follow-up

## 2019-03-08 NOTE — Progress Notes (Signed)
Patient Care Team: Alroy Dust, L.[name], MD as PCP - General (Family Medicine) Rolm Bookbinder, MD as Consulting Physician (General Surgery) Nicholas Lose, MD as Consulting Physician (Hematology and Oncology) Gery Pray, MD as Consulting Physician (Radiation Oncology) Irene Limbo, MD as Consulting Physician (Plastic Surgery)  DIAGNOSIS:    ICD-10-CM   1. Malignant neoplasm of lower-inner quadrant of left breast in female, estrogen receptor positive (Angela [name])  C50.312    Z17.0     SUMMARY OF ONCOLOGIC HISTORY: Oncology History  Malignant neoplasm of lower-inner quadrant of left breast in female, estrogen receptor positive (Angela [name])  07/14/2017 Initial Diagnosis   Screening detected left breast asymmetry at 6 o'clock position, could not be measured accurately.  Axillary ultrasound was not performed.  Biopsy of the asymmetry revealed IDC grade 1, ER 95%, PR 60%, Ki-67 2%, HER-2 negative ratio 1.52, T1 NX stage Ia probable clinical stage   07/30/2017 Breast MRI   Biopsy-proven left breast 4.4 cm with non-mass enhancement overall extent 5.5 cm.  Additional clumped non-mass enhancement left breast 2 sites middle depth anterior depth suspicious for multifocal disease overall extent 8 cm, 5 morphologically abnormal lymph nodes in the left axilla 2 prominent lymph nodes in the right axilla   08/15/2017 Procedure   Left breast biopsy anterior anterior and lower outer quadrants DCIS high-grade; left axillary lymph node biopsy benign fatty tissue no lymph node tissue present   08/27/2017 -  Anti-estrogen oral therapy   Letrozole 2.5 mg daily   01/06/2018 Surgery   Left mastectomy with reconstruction on 01/06/2018: IDC, 3 cm, margins negative, 0/1 lymph node negative, grade 2, ER 100%, PR 0%, Ki-67 3%   01/06/2018 Oncotype testing   12/3%     CHIEF COMPLIANT: Follow-up of left breast cancer on letrozole therapy  INTERVAL HISTORY: Angela [name] is a 64 y.o. with above-mentioned history of  eft breast cancer for which she underwent neoadjuvant letrozole therapy, a mastectomy of the left breast, and is currently on antiestrogen therapy with letrozole. Mammogram on 07/09/18 showed no evidence of malignancy in the right breast. She presents to the clinic today for annual follow-up.  Patient is complaining of a rash on the left breast.  She had a biopsy done by Dr. Iran Planas.  She also complains of itching of the right nipple and a small palpable nodule underneath.   ALLERGIES:  is allergic to other; aspirin; and ibuprofen.  MEDICATIONS:  Current Outpatient Medications  Medication Sig Dispense Refill  . albuterol (PROVENTIL) (2.5 MG/3ML) 0.083% nebulizer solution Take 2.5 mg by nebulization every 6 (six) hours as needed for wheezing or shortness of breath.    . Albuterol Sulfate (PROAIR RESPICLICK) 277 (90 Base) MCG/ACT AEPB Inhale 2 puffs into the lungs every 4 (four) hours as needed. (Patient taking differently: Inhale 2 puffs into the lungs every 6 (six) hours as needed (wheezing/shortness of breath). ) 1 each 1  . diphenhydrAMINE (BENADRYL) 25 MG tablet Take 25 mg by mouth every 6 (six) hours as needed for itching or allergies.    Marland Kitchen letrozole (FEMARA) 2.5 MG tablet TAKE 1 TABLET BY MOUTH EVERY DAY 90 tablet 2  . levothyroxine (SYNTHROID, LEVOTHROID) 125 MCG tablet Take 125 mcg by mouth daily before breakfast.  3  . loratadine (CLARITIN) 10 MG tablet Take 10 mg by mouth daily.    . methocarbamol (ROBAXIN) 500 MG tablet Take 1 tablet (500 mg total) by mouth every 8 (eight) hours as needed for muscle spasms. 30 tablet 0  . montelukast (SINGULAIR)  10 MG tablet Take 10 mg by mouth at bedtime.    . Multiple Vitamin (MULTIVITAMIN) tablet Take 1 tablet by mouth daily.    Marland Kitchen triamcinolone (NASACORT AQ) 55 MCG/ACT AERO nasal inhaler Place 1 spray into the nose at bedtime.      No current facility-administered medications for this visit.    PHYSICAL EXAMINATION: ECOG PERFORMANCE STATUS: 1 -  Symptomatic but completely ambulatory  Vitals:   03/08/19 0956  BP: 118/72  Pulse: 93  Resp: 18  Temp: 97.9 F (36.6 C)   Filed Weights   03/08/19 0956  Weight: 241 lb 12.8 oz (109.7 kg)    BREAST: Rash on the left breast with appearance of dry skin.  It is not erythematous.  There is no tenderness and there is no fluctuation underneath.  She tells me that it has improved recently and that she is going to apply cortisone cream.  She had biopsies done by Dr. Iran Planas.  We are waiting on the results. (exam performed in the presence of a chaperone)  LABORATORY DATA:  I have reviewed the data as listed CMP Latest Ref Rng & Units 07/23/2017 08/11/2016 08/10/2016  Glucose 70 - 99 mg/dL 95 137(H) 115(H)  BUN 6 - 20 mg/dL '14 12 11  '$ Creatinine 0.44 - 1.00 mg/dL 0.94 0.90 0.88  Sodium 135 - 145 mmol/L 144 139 143  Potassium 3.5 - 5.1 mmol/L 4.0 4.6 3.4(L)  Chloride 98 - 111 mmol/L 109 107 109  CO2 22 - 32 mmol/L '24 23 26  '$ Calcium 8.9 - 10.3 mg/dL 9.6 9.2 9.1  Total Protein 6.5 - 8.1 g/dL 7.7 - -  Total Bilirubin 0.3 - 1.2 mg/dL 0.2(L) - -  Alkaline Phos 38 - 126 U/L 79 - -  AST 15 - 41 U/L 11(L) - -  ALT 0 - 44 U/L 12 - -    Lab Results  Component Value Date   WBC 6.9 01/02/2018   HGB 13.3 01/02/2018   HCT 44.4 01/02/2018   MCV 77.1 (L) 01/02/2018   PLT 325 01/02/2018   NEUTROABS 4.3 07/23/2017    ASSESSMENT & PLAN:  Malignant neoplasm of lower-inner quadrant of left breast in female, estrogen receptor positive (Angela [name]) 07/14/2017:Screening detected left breast asymmetry at 6 o'clock position, could not be measured accurately. Axillary ultrasound was not performed. Biopsy of the asymmetry revealed IDC grade 1, ER 95%, PR 60%, Ki-67 2%, HER-2 negative ratio 1.52, T1 NX stage Ia probable clinical stage  Additional biopsies left breast showed DCIS Left axillary lymph node biopsy came back benign although there was no lymphoid tissue. Oncotype DX score 12: 3% risk of distant  recurrence of 9 years, no chemo benefit Left mastectomy with reconstruction on 01/06/2018: IDC, 3 cm, margins negative, 0/1 lymph node negative, grade 2, ER 100%, PR 0%, Ki-67 3%  Current treatment: Adjuvant antiestrogen therapy with letrozole (letrozole was started neoadjuvantly in July 2019) Letrozole toxicities: Denies any adverse effects to letrozole.  Breast cancer surveillance: 1.  Breast exam: Benign 2.  Mammogram right breast: 07/10/2018: Benign breast density category C  Rash on the left breast: Unclear etiology she is going to apply cortisone cream.  If it does not get better then we may have to perform an MRI for further evaluation.  Return to clinic in 1 year for follow-up   No orders of the defined types were placed in this encounter.  The patient has a good understanding of the overall plan. she agrees with it. she will call  with any problems that may develop before the next visit here.  Total time spent: 20 mins including face to face time and time spent for planning, charting and coordination of care  Nicholas Lose, MD 03/08/2019  I, Cloyde Reams Dorshimer, am acting as scribe for Dr. Nicholas Lose.  I have reviewed the above documentation for accuracy and completeness, and I agree with the above.

## 2019-03-09 ENCOUNTER — Telehealth: Payer: Self-pay | Admitting: Hematology and Oncology

## 2019-03-09 NOTE — Telephone Encounter (Signed)
I talk with patient regarding schedule  

## 2019-03-23 ENCOUNTER — Other Ambulatory Visit: Payer: 59

## 2019-03-24 ENCOUNTER — Ambulatory Visit (INDEPENDENT_AMBULATORY_CARE_PROVIDER_SITE_OTHER): Payer: 59 | Admitting: Ophthalmology

## 2019-03-24 ENCOUNTER — Other Ambulatory Visit: Payer: Self-pay

## 2019-03-24 DIAGNOSIS — H3581 Retinal edema: Secondary | ICD-10-CM

## 2019-03-24 DIAGNOSIS — H25813 Combined forms of age-related cataract, bilateral: Secondary | ICD-10-CM

## 2019-03-24 DIAGNOSIS — H35341 Macular cyst, hole, or pseudohole, right eye: Secondary | ICD-10-CM

## 2019-03-24 DIAGNOSIS — H43821 Vitreomacular adhesion, right eye: Secondary | ICD-10-CM | POA: Diagnosis not present

## 2019-03-24 NOTE — Progress Notes (Signed)
[facility] Note  03/24/2019     CHIEF COMPLAINT Patient presents for Retina Evaluation   HISTORY OF PRESENT ILLNESS: Angela [name] is a 64 y.o. female who presents to the clinic today for:   HPI    Retina Evaluation    In right eye.  This started 4 days ago.  Duration of 4 days.  Associated Symptoms Distortion.  Context:  distance vision.  I, the attending physician,  performed the HPI with the patient and updated documentation appropriately.          Comments    Pt states she noticed over the weekend, that her vision in her right eye was distorted in the center.  Patient states she tried changing her glasses to see if this would help but did not improve.  Patient denies eye pain or discomfort and denies any new or worsening floaters or fol OU.  Pt reports Family Hx of Glaucoma (Mother) Pt reports Hx of breast cancer (L Mastectomy)       Last edited by Bernarda Caffey, MD on 03/24/2019  2:05 PM. (History)    pt states she noticed her vision decreasing over the past couple of weeks, and over the weekend she noticed her right eye was distorted, she states she called [facility] office on Monday to get an appt, she saw him this morning and he sent her here for a macular hole, pt denies diabetes or high blood pressure, pt does have allergies and asthma and she takes a medication for cancer  Referring physician: Calton Dach, MD [place],  [place] [phone]  HISTORICAL INFORMATION:   Selected notes from the [facility]: No current outpatient medications on file. (Ophthalmic Drugs)   No current facility-administered medications for this visit. (Ophthalmic Drugs)   Current Outpatient Medications (Other)  Medication Sig  . albuterol (PROVENTIL) (2.5 MG/3ML) 0.083% nebulizer solution Take 2.5 mg by nebulization every 6 (six) hours as needed for wheezing or shortness of breath.  . Albuterol Sulfate  (PROAIR RESPICLICK) [id number] (90 Base) MCG/ACT AEPB Inhale 2 puffs into the lungs every 4 (four) hours as needed. (Patient taking differently: Inhale 2 puffs into the lungs every 6 (six) hours as needed (wheezing/shortness of breath). )  . diphenhydrAMINE (BENADRYL) 25 MG tablet Take 25 mg by mouth every 6 (six) hours as needed for itching or allergies.  Marland Kitchen letrozole (FEMARA) 2.5 MG tablet Take 1 tablet (2.5 mg total) by mouth daily.  Marland Kitchen levothyroxine (SYNTHROID, LEVOTHROID) 125 MCG tablet Take 125 mcg by mouth daily before breakfast.  . loratadine (CLARITIN) 10 MG tablet Take 10 mg by mouth daily.  . methocarbamol (ROBAXIN) 500 MG tablet Take 1 tablet (500 mg total) by mouth every 8 (eight) hours as needed for muscle spasms.  . montelukast (SINGULAIR) 10 MG tablet Take 10 mg by mouth at bedtime.  . Multiple Vitamin (MULTIVITAMIN) tablet Take 1 tablet by mouth daily.  Marland Kitchen triamcinolone (NASACORT AQ) 55 MCG/ACT AERO nasal inhaler Place 1 spray into the nose at bedtime.    No current facility-administered medications for this visit. (Other)      REVIEW OF SYSTEMS: ROS    Negative for: Constitutional, Gastrointestinal, Neurological, Skin, Genitourinary, Musculoskeletal, HENT, Endocrine, Cardiovascular, Eyes, Respiratory, Psychiatric, Allergic/Imm, Heme/Lymph   Last edited by Doneen Poisson on 03/24/2019  1:20 PM. (History)       ALLERGIES Allergies  Allergen Reactions  . Other Other (See Comments)  NO BLOOD PRODUCTS - PT IN AGREEMENT FOR ALBUMIN OR ALBUMIN CONTAINING PRODUCTS PER CONSENT  . Aspirin Other (See Comments)    Stomach ulcers   . Ibuprofen Other (See Comments)    Cautious by MD due to kidney function     PAST MEDICAL HISTORY Past Medical History:  Diagnosis Date  . Anxiety   . Asthma   . Breast cancer ([facility]) 07/2017   Left Breast Cancer  . Breast discharge   . Congestion of right ear   . Depression   . GERD (gastroesophageal reflux disease)   . Headache(784.0)   .  Hyperlipidemia   . Hypothyroidism   . Seizures ([facility])    hx of during childhood -not since age 69  . Sleep apnea    uses CPAP  . Thyroid disease   . Tinnitus   . Urticaria    Past Surgical History:  Procedure Laterality Date  . ANTERIOR AND POSTERIOR REPAIR  2007  . BREAST DUCTAL SYSTEM EXCISION  01/18/2011   Procedure: EXCISION DUCTAL SYSTEM BREAST;  Surgeon: Belva Crome, MD;  Location: [facility];  Service: General;  Laterality: Right;  . BREAST EXCISIONAL BIOPSY Right   . BREAST RECONSTRUCTION WITH PLACEMENT OF TISSUE EXPANDER AND ALLODERM Left 01/06/2018   Procedure: BREAST RECONSTRUCTION WITH PLACEMENT OF TISSUE EXPANDER AND ALLODERM;  Surgeon: Irene Limbo, MD;  Location: [facility];  Service: Plastics;  Laterality: Left;  . BREAST SURGERY  01/18/2011   Right BX  . CYSTOSCOPY WITH URETHROLYSIS N/A 11/01/2014   Procedure: CYSTOSCOPY WITH URETHROLYSIS;  Surgeon: Bjorn Loser, MD;  Location: [facility];  Service: Urology;  Laterality: N/A;  . DILATION AND CURETTAGE OF UTERUS    . MASTECTOMY Left 12/2017  . PUBOVAGINAL SLING N/A 11/01/2014   Procedure: REMOVAL VAGINAL SLING AND URETHROLYSIS AND CYSTO;  Surgeon: Bjorn Loser, MD;  Location: [facility];  Service: Urology;  Laterality: N/A;  . REMOVAL OF TISSUE EXPANDER AND PLACEMENT OF IMPLANT Left 07/14/2018   Procedure: REMOVAL OF  LEFT TISSUE EXPANDER AND PLACEMENT OF IMPLANT;  Surgeon: Irene Limbo, MD;  Location: [facility];  Service: Plastics;  Laterality: Left;  . TUBAL LIGATION  1998  . TYMPANOSTOMY TUBE PLACEMENT  2010   right ear   . WISDOM TOOTH EXTRACTION      FAMILY HISTORY Family History  Problem Relation Age of Onset  . Cancer Father        throat  . Cancer Maternal Aunt        breast  . Breast cancer Maternal Aunt        in 59's  . Allergic rhinitis Neg Hx   . Asthma Neg Hx   . Eczema Neg Hx   . Immunodeficiency Neg Hx   . Urticaria Neg Hx      SOCIAL HISTORY Social History   Tobacco Use  . Smoking status: Never Smoker  . Smokeless tobacco: Never Used  Substance Use Topics  . Alcohol use: No  . Drug use: No         OPHTHALMIC EXAM:  Base Eye Exam    Visual Acuity (Snellen - Linear)      Right Left   Dist cc 20/80 +1 20/30   Dist ph cc 20/70 +1 20/25 -2   Correction: Glasses       Tonometry (Tonopen, 1:32 PM)      Right Left   Pressure 17 16       Pupils  Dark Light Shape React APD   Right 8 8 Round Dilated 0   Left 8 8 Round Dilated 0       Extraocular Movement      Right Left    Full Full       Neuro/Psych    Oriented x3: Yes   Mood/Affect: Normal       Dilation    Both eyes: 1.0% Mydriacyl, 2.5% Phenylephrine @ 1:32 PM        Slit Lamp and Fundus Exam    Slit Lamp Exam      Right Left   Lids/Lashes Dermatochalasis - upper lid, mild Meibomian gland dysfunction Dermatochalasis - upper lid, mild Meibomian gland dysfunction   Conjunctiva/Sclera White and quiet White and quiet   Cornea Arcus, trace Punctate epithelial erosions Arcus, trace Punctate epithelial erosions   Anterior Chamber Deep and quiet Deep and quiet   Iris Round and dilated Round and dilated   Lens 2+ Nuclear sclerosis, 2+ Cortical cataract 2+ Nuclear sclerosis, 2+ Cortical cataract   Vitreous Vitreous syneresis, Posterior vitreous detachment, vitreous condensations Vitreous syneresis, vitreous condensations       Fundus Exam      Right Left   Disc Pink and Sharp Pink and Sharp   C/D Ratio 0.5 0.5   Macula Macular hole with surrounding CME, no heme Flat, Blunted foveal reflex, Retinal pigment epithelial mottling, No heme or edema   Vessels mild Vascular attenuation mild Vascular attenuation   Periphery Attached, No heme, no RT/RD Attached           Refraction    Wearing Rx      Sphere Cylinder Axis   Right -6.50 +1.00 107   Left -6.75 +1.25 060   Age: 2017   Type: progressive       Manifest Refraction       Sphere Cylinder Axis Dist VA   Right -6.50 +1.00 105 20/70+1   Left -6.25 +0.75 060 20/30          IMAGING AND PROCEDURES  Imaging and Procedures for @TODAY @  OCT, Retina - OU - Both Eyes       Right Eye Quality was good. Central Foveal Thickness: 334. Progression has no prior data. Findings include abnormal foveal contour, intraretinal fluid, subretinal fluid, vitreomacular adhesion .   Left Eye Quality was good. Central Foveal Thickness: 256. Progression has no prior data. Findings include normal foveal contour, no IRF, no SRF (Partial PVD).   Notes *Images captured and stored on drive  Diagnosis / Impression:  OD: macular hole with surrounding CME and residual vitreous traction OS: NFP, no IRF/SRF; partial PVD  Clinical management:  See below  Abbreviations: NFP - Normal foveal profile. CME - cystoid macular edema. PED - pigment epithelial detachment. IRF - intraretinal fluid. SRF - subretinal fluid. EZ - ellipsoid zone. ERM - epiretinal membrane. ORA - outer retinal atrophy. ORT - outer retinal tubulation. SRHM - subretinal hyper-reflective material                 ASSESSMENT/PLAN:    ICD-10-CM   1. Macular hole of right eye  H35.341   2. Vitreomacular adhesion of right eye  H43.821   3. Retinal edema  H35.81 OCT, Retina - OU - Both Eyes  4. Combined forms of age-related cataract of both eyes  H25.813     1-3. Macular hole with CME and residual vitreous traction OD  - BCVA 20/70  - +distortion  - The natural history,  anatomy, potential for loss of vision, and treatment options including vitrectomy techniques and the complications of endophthalmitis, retinal detachment, vitreous hemorrhage, cataract progression and permanent vision loss discussed with the patient.  - discussed low possibility of spontaneous closure following release of VMT  - recommend close monitoring for now  - f/u 2 weeks, DFE, OCT  4. Mixed form age-related cataracts OU  - The  symptoms of cataract, surgical options, and treatments and risks were discussed with patient.  - discussed diagnosis and progression  - not yet visually significant  - monitor for now   Ophthalmic Meds Ordered this visit:  No orders of the defined types were placed in this encounter.      Return in about 2 weeks (around 04/07/2019) for f/u macular hole OS, DFE, OCT.  There are no Patient Instructions on file for this visit.   Explained the diagnoses, plan, and follow up with the patient and they expressed understanding.  Patient expressed understanding of the importance of proper follow up care.   This document serves as a record of services personally performed by Gardiner Sleeper, MD, PhD. It was created on their behalf by Ernest Mallick, OA, an ophthalmic assistant. The creation of this record is the provider's dictation and/or activities during the visit.    Electronically signed by: Ernest Mallick, OA 02.24.2021 1:09 PM   Gardiner Sleeper, M.D., Ph.D. Diseases & Surgery of the Retina and Vitreous [facility]  I have reviewed the above documentation for accuracy and completeness, and I agree with the above. Gardiner Sleeper, M.D., Ph.D. 03/26/19 1:09 PM   Abbreviations: M myopia (nearsighted); A astigmatism; H hyperopia (farsighted); P presbyopia; Mrx spectacle prescription;  CTL contact lenses; OD right eye; OS left eye; OU both eyes  XT exotropia; ET esotropia; PEK punctate epithelial keratitis; PEE punctate epithelial erosions; DES dry eye syndrome; MGD meibomian gland dysfunction; ATs artificial tears; PFAT's preservative free artificial tears; [place] nuclear sclerotic cataract; PSC posterior subcapsular cataract; ERM epi-retinal membrane; PVD posterior vitreous detachment; RD retinal detachment; DM diabetes mellitus; DR diabetic retinopathy; NPDR non-proliferative diabetic retinopathy; PDR proliferative diabetic retinopathy; CSME clinically significant macular  edema; DME diabetic macular edema; dbh dot blot hemorrhages; CWS cotton wool spot; POAG primary open angle glaucoma; C/D cup-to-disc ratio; HVF humphrey visual field; GVF goldmann visual field; OCT optical coherence tomography; IOP intraocular pressure; BRVO Branch retinal vein occlusion; CRVO central retinal vein occlusion; CRAO central retinal artery occlusion; BRAO branch retinal artery occlusion; RT retinal tear; SB scleral buckle; PPV pars plana vitrectomy; VH Vitreous hemorrhage; PRP panretinal laser photocoagulation; IVK intravitreal kenalog; VMT vitreomacular traction; MH Macular hole;  NVD neovascularization of the disc; NVE neovascularization elsewhere; AREDS age related eye disease study; ARMD age related macular degeneration; POAG primary open angle glaucoma; EBMD epithelial/anterior basement membrane dystrophy; ACIOL anterior chamber intraocular lens; IOL intraocular lens; PCIOL posterior chamber intraocular lens; Phaco/IOL phacoemulsification with intraocular lens placement; [place] photorefractive keratectomy; LASIK laser assisted in situ keratomileusis; HTN hypertension; DM diabetes mellitus; COPD chronic obstructive pulmonary disease

## 2019-03-26 ENCOUNTER — Encounter (INDEPENDENT_AMBULATORY_CARE_PROVIDER_SITE_OTHER): Payer: Self-pay | Admitting: Ophthalmology

## 2019-04-05 NOTE — Progress Notes (Signed)
[facility] Note  04/06/2019     CHIEF COMPLAINT Patient presents for Retina Follow Up   HISTORY OF PRESENT ILLNESS: Angela [name] is a 64 y.o. female who presents to the clinic today for:   HPI    Retina Follow Up    Patient presents with  Other.  In right eye.  Since onset it is gradually worsening.  I, the attending physician,  performed the HPI with the patient and updated documentation appropriately.          Comments    Pt using  Alphagan P BID OU Artificial tears BID OU Pt states her vision is about the same.  She has occasional pain OD--patient states she was told it was from dry eye.  Patient denies new or worsening floaters or fol OU.       Last edited by Bernarda Caffey, MD on 04/06/2019  7:56 PM. (History)    pt states   Referring physician: Alroy Dust, L.[name], [place] [address] [place],  [place] [address]  HISTORICAL INFORMATION:   Selected notes from the MEDICAL RECORD NUMBER Referred by Dr. Shirley Muscat for mac hole OD   CURRENT MEDICATIONS: No current outpatient medications on file. (Ophthalmic Drugs)   No current facility-administered medications for this visit. (Ophthalmic Drugs)   Current Outpatient Medications (Other)  Medication Sig  . albuterol (PROVENTIL) (2.5 MG/3ML) 0.083% nebulizer solution Take 2.5 mg by nebulization every 6 (six) hours as needed for wheezing or shortness of breath.  . Albuterol Sulfate (PROAIR RESPICLICK) 270 (90 Base) MCG/ACT AEPB Inhale 2 puffs into the lungs every 4 (four) hours as needed. (Patient taking differently: Inhale 2 puffs into the lungs every 6 (six) hours as needed (wheezing/shortness of breath). )  . diphenhydrAMINE (BENADRYL) 25 MG tablet Take 25 mg by mouth every 6 (six) hours as needed for itching or allergies.  Marland Kitchen letrozole (FEMARA) 2.5 MG tablet Take 1 tablet (2.5 mg total) by mouth daily.  Marland Kitchen levothyroxine (SYNTHROID, LEVOTHROID) 125 MCG tablet Take 125 mcg by mouth daily  before breakfast.  . loratadine (CLARITIN) 10 MG tablet Take 10 mg by mouth daily.  . methocarbamol (ROBAXIN) 500 MG tablet Take 1 tablet (500 mg total) by mouth every 8 (eight) hours as needed for muscle spasms.  . montelukast (SINGULAIR) 10 MG tablet Take 10 mg by mouth at bedtime.  . Multiple Vitamin (MULTIVITAMIN) tablet Take 1 tablet by mouth daily.  Marland Kitchen triamcinolone (NASACORT AQ) 55 MCG/ACT AERO nasal inhaler Place 1 spray into the nose at bedtime.    No current facility-administered medications for this visit. (Other)      REVIEW OF SYSTEMS: ROS    Positive for: Eyes   Negative for: Constitutional, Gastrointestinal, Neurological, Skin, Genitourinary, Musculoskeletal, HENT, Endocrine, Cardiovascular, Respiratory, Psychiatric, Allergic/Imm, Heme/Lymph   Last edited by Doneen Poisson on 04/06/2019  1:55 PM. (History)       ALLERGIES Allergies  Allergen Reactions  . Other Other (See Comments)    NO BLOOD PRODUCTS - PT IN AGREEMENT FOR ALBUMIN OR ALBUMIN CONTAINING PRODUCTS PER CONSENT  . Aspirin Other (See Comments)    Stomach ulcers   . Ibuprofen Other (See Comments)    Cautious by MD due to kidney function     PAST MEDICAL HISTORY Past Medical History:  Diagnosis Date  . Anxiety   . Asthma   . Breast cancer ([place]) 07/2017   Left Breast Cancer  . Breast discharge   .  Congestion of right ear   . Depression   . GERD (gastroesophageal reflux disease)   . Headache(784.0)   . Hyperlipidemia   . Hypothyroidism   . Seizures ([place])    hx of during childhood -not since age 5  . Sleep apnea    uses CPAP  . Thyroid disease   . Tinnitus   . Urticaria    Past Surgical History:  Procedure Laterality Date  . ANTERIOR AND POSTERIOR REPAIR  2007  . BREAST DUCTAL SYSTEM EXCISION  01/18/2011   Procedure: EXCISION DUCTAL SYSTEM BREAST;  Surgeon: Belva Crome, MD;  Location: [facility];  Service: General;  Laterality: Right;  . BREAST EXCISIONAL BIOPSY  Right   . BREAST RECONSTRUCTION WITH PLACEMENT OF TISSUE EXPANDER AND ALLODERM Left 01/06/2018   Procedure: BREAST RECONSTRUCTION WITH PLACEMENT OF TISSUE EXPANDER AND ALLODERM;  Surgeon: Irene Limbo, MD;  Location: [facility];  Service: Plastics;  Laterality: Left;  . BREAST SURGERY  01/18/2011   Right BX  . CYSTOSCOPY WITH URETHROLYSIS N/A 11/01/2014   Procedure: CYSTOSCOPY WITH URETHROLYSIS;  Surgeon: Bjorn Loser, MD;  Location: [facility];  Service: Urology;  Laterality: N/A;  . DILATION AND CURETTAGE OF UTERUS    . MASTECTOMY Left 12/2017  . PUBOVAGINAL SLING N/A 11/01/2014   Procedure: REMOVAL VAGINAL SLING AND URETHROLYSIS AND CYSTO;  Surgeon: Bjorn Loser, MD;  Location[facility];  Service: Urology;  Laterality: N/A;  . REMOVAL OF TISSUE EXPANDER AND PLACEMENT OF IMPLANT Left 07/14/2018   Procedure: REMOVAL OF  LEFT TISSUE EXPANDER AND PLACEMENT OF IMPLANT;  Surgeon: Irene Limbo, MD;  Location: [facility];  Service: Plastics;  Laterality: Left;  . TUBAL LIGATION  1998  . TYMPANOSTOMY TUBE PLACEMENT  2010   right ear   . WISDOM TOOTH EXTRACTION      FAMILY HISTORY Family History  Problem Relation Age of Onset  . Cancer Father        throat  . Cancer Maternal Aunt        breast  . Breast cancer Maternal Aunt        in 31's  . Allergic rhinitis Neg Hx   . Asthma Neg Hx   . Eczema Neg Hx   . Immunodeficiency Neg Hx   . Urticaria Neg Hx     SOCIAL HISTORY Social History   Tobacco Use  . Smoking status: Never Smoker  . Smokeless tobacco: Never Used  Substance Use Topics  . Alcohol use: No  . Drug use: No         OPHTHALMIC EXAM:  Base Eye Exam    Visual Acuity (Snellen - Linear)      Right Left   Dist cc 20/80 -2 20/25 -2   Dist ph cc 20/70 -2 20/20 -2   Correction: Glasses       Tonometry (Tonopen, 1:59 PM)      Right Left   Pressure 20 13       Pupils      Dark Light Shape React APD   Right 2 1 Round  Minimal 0   Left 2 1 Round Minimal 0       Extraocular Movement      Right Left    Full Full       Neuro/Psych    Oriented x3: Yes   Mood/Affect: Normal       Dilation    Both eyes: 1.0% Mydriacyl, 2.5% Phenylephrine @ 1:59 PM  Slit Lamp and Fundus Exam    Slit Lamp Exam      Right Left   Lids/Lashes Dermatochalasis - upper lid, mild Meibomian gland dysfunction Dermatochalasis - upper lid, mild Meibomian gland dysfunction   Conjunctiva/Sclera White and quiet White and quiet   Cornea Arcus, trace Punctate epithelial erosions Arcus, trace Punctate epithelial erosions   Anterior Chamber Deep and quiet Deep and quiet   Iris Round and dilated Round and dilated   Lens 2+ Nuclear sclerosis, 2+ Cortical cataract 2+ Nuclear sclerosis, 2+ Cortical cataract   Vitreous Vitreous syneresis, Posterior vitreous detachment, vitreous condensations Vitreous syneresis, vitreous condensations       Fundus Exam      Right Left   Disc Pink and Sharp Pink and Sharp   C/D Ratio 0.5 0.5   Macula Macular hole with surrounding CME, no heme Flat, Blunted foveal reflex, Retinal pigment epithelial mottling, No heme or edema   Vessels mild Vascular attenuation mild Vascular attenuation   Periphery Attached, No heme, no RT/RD Attached           Refraction    Wearing Rx      Sphere Cylinder Axis   Right -6.50 +1.00 107   Left -6.75 +1.25 060   Type: progressive          IMAGING AND PROCEDURES  Imaging and Procedures for _0 @  OCT, Retina - OU - Both Eyes       Right Eye Quality was good. Central Foveal Thickness: 405. Progression has worsened. Findings include abnormal foveal contour, intraretinal fluid, subretinal fluid, vitreomacular adhesion  (Interval progression of macular hole).   Left Eye Quality was good. Central Foveal Thickness: 258. Progression has no prior data. Findings include normal foveal contour, no IRF, no SRF (Partial PVD).   Notes *Images captured and  stored on drive  Diagnosis / Impression:  OD: interval progression of macular hole with surrounding CME and residual vitreous traction OS: NFP, no IRF/SRF; partial PVD  Clinical management:  See below  Abbreviations: NFP - Normal foveal profile. CME - cystoid macular edema. PED - pigment epithelial detachment. IRF - intraretinal fluid. SRF - subretinal fluid. EZ - ellipsoid zone. ERM - epiretinal membrane. ORA - outer retinal atrophy. ORT - outer retinal tubulation. SRHM - subretinal hyper-reflective material                 ASSESSMENT/PLAN:    ICD-10-CM   1. Macular hole of right eye  H35.341   2. Vitreomacular adhesion of right eye  H43.821   3. Retinal edema  H35.81 OCT, Retina - OU - Both Eyes  4. Combined forms of age-related cataract of both eyes  H25.813     1-3. Macular hole with CME and residual vitreous traction OD  - +distortion and decreased vision OD  - BCVA stable at 20/70  - OCT shows mild interval progression of macular hole w/ persistent CME and VMT  - discussed low likelihood of spontaneous closure  - recommend 25g PPV w/ MP and gas OD under general anesthesia  - pt wishes to proceed with surgery on April 1st  - RBA of procedure discussed, questions answered  - informed consent obtained and signed  - will need clearance from PCP and pre-op COVID testing  - will schedule case for April 1st -- [facility] OR 08  - f/u April 2 for POV  4. Mixed form age-related cataracts OU  - The symptoms of cataract, surgical options, and treatments and risks were discussed  with patient.  - discussed diagnosis and progression  - not yet visually significant  - discussed likelihood of cataract progression OD post PPV  - monitor for now   Ophthalmic Meds Ordered this visit:  No orders of the defined types were placed in this encounter.      Return for f/u April 2, POV macular hole, DFE.  There are no Patient Instructions on file for this visit.   Explained the  diagnoses, plan, and follow up with the patient and they expressed understanding.  Patient expressed understanding of the importance of proper follow up care.   This document serves as a record of services personally performed by Gardiner Sleeper, MD, PhD. It was created on their behalf by Estill Bakes, COT an ophthalmic technician. The creation of this record is the provider's dictation and/or activities during the visit.    Electronically signed by: Estill Bakes, COT 04/05/19 @ 8:13 PM  Gardiner Sleeper, M.D., Ph.D. Diseases & Surgery of the Retina and [place] 04/06/2019   I have reviewed the above documentation for accuracy and completeness, and I agree with the above. Gardiner Sleeper, M.D., Ph.D. 04/06/19 8:13 PM   Abbreviations: M myopia (nearsighted); A astigmatism; H hyperopia (farsighted); P presbyopia; Mrx spectacle prescription;  CTL contact lenses; OD right eye; OS left eye; OU both eyes  XT exotropia; ET esotropia; PEK punctate epithelial keratitis; PEE punctate epithelial erosions; DES dry eye syndrome; MGD meibomian gland dysfunction; ATs artificial tears; PFAT's preservative free artificial tears; [place] nuclear sclerotic cataract; PSC posterior subcapsular cataract; ERM epi-retinal membrane; PVD posterior vitreous detachment; RD retinal detachment; DM diabetes mellitus; DR diabetic retinopathy; NPDR non-proliferative diabetic retinopathy; PDR proliferative diabetic retinopathy; CSME clinically significant macular edema; DME diabetic macular edema; dbh dot blot hemorrhages; CWS cotton wool spot; POAG primary open angle glaucoma; C/D cup-to-disc ratio; HVF humphrey visual field; GVF goldmann visual field; OCT optical coherence tomography; IOP intraocular pressure; BRVO Branch retinal vein occlusion; CRVO central retinal vein occlusion; CRAO central retinal artery occlusion; BRAO branch retinal artery occlusion; RT retinal tear; SB scleral buckle; PPV pars  plana vitrectomy; VH Vitreous hemorrhage; PRP panretinal laser photocoagulation; IVK intravitreal kenalog; VMT vitreomacular traction; MH Macular hole;  NVD neovascularization of the disc; NVE neovascularization elsewhere; AREDS age related eye disease study; ARMD age related macular degeneration; POAG primary open angle glaucoma; EBMD epithelial/anterior basement membrane dystrophy; ACIOL anterior chamber intraocular lens; IOL intraocular lens; PCIOL posterior chamber intraocular lens; Phaco/IOL phacoemulsification with intraocular lens placement; Crisp photorefractive keratectomy; LASIK laser assisted in situ keratomileusis; HTN hypertension; DM diabetes mellitus; COPD chronic obstructive pulmonary disease

## 2019-04-06 ENCOUNTER — Encounter (INDEPENDENT_AMBULATORY_CARE_PROVIDER_SITE_OTHER): Payer: Self-pay | Admitting: Ophthalmology

## 2019-04-06 ENCOUNTER — Ambulatory Visit (INDEPENDENT_AMBULATORY_CARE_PROVIDER_SITE_OTHER): Payer: 59 | Admitting: Ophthalmology

## 2019-04-06 DIAGNOSIS — H3581 Retinal edema: Secondary | ICD-10-CM | POA: Diagnosis not present

## 2019-04-06 DIAGNOSIS — H43821 Vitreomacular adhesion, right eye: Secondary | ICD-10-CM | POA: Diagnosis not present

## 2019-04-06 DIAGNOSIS — H25813 Combined forms of age-related cataract, bilateral: Secondary | ICD-10-CM

## 2019-04-06 DIAGNOSIS — H35341 Macular cyst, hole, or pseudohole, right eye: Secondary | ICD-10-CM

## 2019-04-26 ENCOUNTER — Other Ambulatory Visit (HOSPITAL_COMMUNITY)
Admission: RE | Admit: 2019-04-26 | Discharge: 2019-04-26 | Disposition: A | Discharge status: Home / Self Care | Payer: 59 | Source: Ambulatory Visit | Attending: Ophthalmology | Admitting: Ophthalmology

## 2019-04-26 DIAGNOSIS — Z20822 Contact with and (suspected) exposure to covid-19: Secondary | ICD-10-CM | POA: Insufficient documentation

## 2019-04-26 DIAGNOSIS — Z01812 Encounter for preprocedural laboratory examination: Secondary | ICD-10-CM | POA: Insufficient documentation

## 2019-04-26 LAB — SARS CORONAVIRUS 2 (TAT 6-24 HRS): SARS Coronavirus 2: NEGATIVE

## 2019-04-27 ENCOUNTER — Other Ambulatory Visit: Payer: Self-pay

## 2019-04-27 ENCOUNTER — Encounter (HOSPITAL_COMMUNITY): Payer: Self-pay | Admitting: Ophthalmology

## 2019-04-27 NOTE — H&P (Signed)
Angela [name] is an 64 y.o. female.    Chief Complaint: macular hole, RIGHT EYE  HPI: Pt presented with decreased vision in right eye and on dilated exam was found to have a macular hole. After a discussion of risks, benefits and alternatives to surgery, the patient elected to proceed with surgical repair of the macular hole via 25g PPV w/ membrane peel and gas OD, under general anesthesia.   Past Medical History:  Diagnosis Date  . Anxiety   . Asthma   . Breast cancer ([facility]) 07/2017   Left Breast Cancer  . Breast discharge   . Congestion of right ear   . Depression   . GERD (gastroesophageal reflux disease)   . Headache(784.0)   . Hyperlipidemia   . Hypothyroidism   . Seizures ([facility])    hx of during childhood -not since age 14  . Sleep apnea    uses CPAP  . Thyroid disease   . Tinnitus   . Urticaria     Past Surgical History:  Procedure Laterality Date  . ANTERIOR AND POSTERIOR REPAIR  2007  . BREAST DUCTAL SYSTEM EXCISION  01/18/2011   Procedure: EXCISION DUCTAL SYSTEM BREAST;  Surgeon: Belva Crome, MD;  Location: [facility];  Service: General;  Laterality: Right;  . BREAST EXCISIONAL BIOPSY Right   . BREAST RECONSTRUCTION WITH PLACEMENT OF TISSUE EXPANDER AND ALLODERM Left 01/06/2018   Procedure: BREAST RECONSTRUCTION WITH PLACEMENT OF TISSUE EXPANDER AND ALLODERM;  Surgeon: Irene Limbo, MD;  Location: [facility];  Service: Plastics;  Laterality: Left;  . BREAST SURGERY  01/18/2011   Right BX  . CYSTOSCOPY WITH URETHROLYSIS N/A 11/01/2014   Procedure: CYSTOSCOPY WITH URETHROLYSIS;  Surgeon: Bjorn Loser, MD;  [facility];  Service: Urology;  Laterality: N/A;  . DILATION AND CURETTAGE OF UTERUS    . MASTECTOMY Left 12/2017  . PUBOVAGINAL SLING N/A 11/01/2014   Procedure: REMOVAL VAGINAL SLING AND URETHROLYSIS AND CYSTO;  Surgeon: Bjorn Loser, MD;  [facility];  Service: Urology;  Laterality: N/A;  . REMOVAL OF  TISSUE EXPANDER AND PLACEMENT OF IMPLANT Left 07/14/2018   Procedure: REMOVAL OF  LEFT TISSUE EXPANDER AND PLACEMENT OF IMPLANT;  Surgeon: Irene Limbo, MD;  Location: [facility];  Service: Plastics;  Laterality: Left;  . TUBAL LIGATION  1998  . TYMPANOSTOMY TUBE PLACEMENT  2010   right ear   . WISDOM TOOTH EXTRACTION      Family History  Problem Relation Age of Onset  . Cancer Father        throat  . Cancer Maternal Aunt        breast  . Breast cancer Maternal Aunt        in 80's  . Allergic rhinitis Neg Hx   . Asthma Neg Hx   . Eczema Neg Hx   . Immunodeficiency Neg Hx   . Urticaria Neg Hx    Social History:  reports that she has never smoked. She has never used smokeless tobacco. She reports that she does not drink alcohol or use drugs.  Allergies:  Allergies  Allergen Reactions  . Other Other (See Comments)    NO BLOOD PRODUCTS - PT IN AGREEMENT FOR ALBUMIN OR ALBUMIN CONTAINING PRODUCTS PER CONSENT  . Aspirin Other (See Comments)    Stomach ulcers   . Ibuprofen Other (See Comments)    Cautious by MD due to kidney function     No medications prior to  admission.    Review of systems otherwise negative  There were no vitals taken for this visit.  Physical exam: Mental status: oriented x3. Eyes: See eye exam associated with this date of surgery Ears, Nose, Throat: within normal limits Neck: Within Normal limits General: within normal limits Chest: Within normal limits Breast: deferred Heart: Within normal limits Abdomen: Within normal limits GU: deferred Extremities: within normal limits Skin: within normal limits  Assessment/Plan 1. Macular hole, RIGHT EYE  Plan: To [facility] for 25g PPV w/ membrane peel and gas OD, under general anesthesia - Case scheduled for 1130 am, Thursday, 4.1.21 -- [facility] OR 08  Gardiner Sleeper, M.D., Ph.D. Vitreoretinal Surgeon [facility]

## 2019-04-27 NOTE — Progress Notes (Signed)
[name] denies chest pain or shortness of breath. [name] was negative for Covid on 04/26/2019, patient has been in quarantine.

## 2019-04-29 ENCOUNTER — Ambulatory Visit (HOSPITAL_COMMUNITY): Payer: 59 | Admitting: Anesthesiology

## 2019-04-29 ENCOUNTER — Encounter (HOSPITAL_COMMUNITY): Payer: Self-pay | Admitting: Ophthalmology

## 2019-04-29 ENCOUNTER — Ambulatory Visit (HOSPITAL_COMMUNITY)
Admission: RE | Admit: 2019-04-29 | Discharge: 2019-04-29 | Disposition: A | Discharge status: Home / Self Care | Payer: 59 | Attending: Ophthalmology | Admitting: Ophthalmology

## 2019-04-29 ENCOUNTER — Other Ambulatory Visit: Payer: Self-pay

## 2019-04-29 ENCOUNTER — Encounter (HOSPITAL_COMMUNITY)
Admission: RE | Disposition: A | Discharge status: Home / Self Care | Payer: Self-pay | Source: Home / Self Care | Attending: Ophthalmology

## 2019-04-29 DIAGNOSIS — E785 Hyperlipidemia, unspecified: Secondary | ICD-10-CM | POA: Insufficient documentation

## 2019-04-29 DIAGNOSIS — Z6839 Body mass index (BMI) 39.0-39.9, adult: Secondary | ICD-10-CM | POA: Insufficient documentation

## 2019-04-29 DIAGNOSIS — H35341 Macular cyst, hole, or pseudohole, right eye: Secondary | ICD-10-CM | POA: Insufficient documentation

## 2019-04-29 DIAGNOSIS — Z853 Personal history of malignant neoplasm of breast: Secondary | ICD-10-CM | POA: Diagnosis not present

## 2019-04-29 DIAGNOSIS — G4730 Sleep apnea, unspecified: Secondary | ICD-10-CM | POA: Insufficient documentation

## 2019-04-29 DIAGNOSIS — E039 Hypothyroidism, unspecified: Secondary | ICD-10-CM | POA: Diagnosis not present

## 2019-04-29 DIAGNOSIS — K219 Gastro-esophageal reflux disease without esophagitis: Secondary | ICD-10-CM | POA: Diagnosis not present

## 2019-04-29 DIAGNOSIS — J45909 Unspecified asthma, uncomplicated: Secondary | ICD-10-CM | POA: Diagnosis not present

## 2019-04-29 DIAGNOSIS — Z79899 Other long term (current) drug therapy: Secondary | ICD-10-CM | POA: Insufficient documentation

## 2019-04-29 DIAGNOSIS — Z79890 Hormone replacement therapy (postmenopausal): Secondary | ICD-10-CM | POA: Diagnosis not present

## 2019-04-29 HISTORY — PX: 25 GAUGE PARS PLANA VITRECTOMY WITH 20 GAUGE MVR PORT FOR MACULAR HOLE: SHX6096

## 2019-04-29 HISTORY — DX: Unspecified asthma, uncomplicated: J45.909

## 2019-04-29 SURGERY — 25 GAUGE PARS PLANA VITRECTOMY WITH 20 GAUGE MVR PORT FOR MACULAR HOLE
Anesthesia: General | Site: Eye | Laterality: Right

## 2019-04-29 MED ORDER — POLYMYXIN B SULFATE 500000 UNITS IJ SOLR
INTRAMUSCULAR | Status: AC
  Filled 2019-04-29: qty 500000

## 2019-04-29 MED ORDER — GATIFLOXACIN 0.5 % OP SOLN OPTIME - NO CHARGE
OPHTHALMIC | Status: DC | PRN
  Administered 2019-04-29: 1 [drp] via OPHTHALMIC

## 2019-04-29 MED ORDER — DIPHENHYDRAMINE HCL 50 MG/ML IJ SOLN
INTRAMUSCULAR | Status: AC
  Filled 2019-04-29: qty 1

## 2019-04-29 MED ORDER — STERILE WATER FOR INJECTION IJ SOLN
INTRAMUSCULAR | Status: DC | PRN
  Administered 2019-04-29: 20 mL

## 2019-04-29 MED ORDER — MIDAZOLAM HCL 2 MG/2ML IJ SOLN: 0.5000 mg | Freq: Once | INTRAMUSCULAR | Status: DC | PRN

## 2019-04-29 MED ORDER — SODIUM CHLORIDE 0.9 % IV SOLN: INTRAVENOUS | Status: DC | PRN

## 2019-04-29 MED ORDER — PROMETHAZINE HCL 25 MG/ML IJ SOLN
INTRAMUSCULAR | Status: AC
  Filled 2019-04-29: qty 1

## 2019-04-29 MED ORDER — ONDANSETRON HCL 4 MG/2ML IJ SOLN
INTRAMUSCULAR | Status: AC
  Filled 2019-04-29: qty 4

## 2019-04-29 MED ORDER — PROMETHAZINE HCL 25 MG/ML IJ SOLN
6.2500 mg | INTRAMUSCULAR | Status: DC | PRN
  Administered 2019-04-29: 6.25 mg via INTRAVENOUS

## 2019-04-29 MED ORDER — CEFTAZIDIME 1 G IJ SOLR
INTRAMUSCULAR | Status: AC
  Filled 2019-04-29: qty 1

## 2019-04-29 MED ORDER — CEFAZOLIN SODIUM 1 G IJ SOLR
INTRAMUSCULAR | Status: AC
  Filled 2019-04-29: qty 20

## 2019-04-29 MED ORDER — ATROPINE SULFATE 1 % OP SOLN
1.0000 [drp] | OPHTHALMIC | Status: AC | PRN
  Administered 2019-04-29 (×3): 1 [drp] via OPHTHALMIC
  Filled 2019-04-29: qty 5

## 2019-04-29 MED ORDER — MEPERIDINE HCL 25 MG/ML IJ SOLN: 6.2500 mg | INTRAMUSCULAR | Status: DC | PRN

## 2019-04-29 MED ORDER — FENTANYL CITRATE (PF) 250 MCG/5ML IJ SOLN
INTRAMUSCULAR | Status: AC
  Filled 2019-04-29: qty 5

## 2019-04-29 MED ORDER — BSS PLUS IO SOLN
INTRAOCULAR | Status: AC
  Filled 2019-04-29: qty 500

## 2019-04-29 MED ORDER — MIDAZOLAM HCL 5 MG/5ML IJ SOLN
INTRAMUSCULAR | Status: DC | PRN
  Administered 2019-04-29: 2 mg via INTRAVENOUS

## 2019-04-29 MED ORDER — FENTANYL CITRATE (PF) 250 MCG/5ML IJ SOLN
INTRAMUSCULAR | Status: DC | PRN
  Administered 2019-04-29: 100 ug via INTRAVENOUS

## 2019-04-29 MED ORDER — DEXAMETHASONE SODIUM PHOSPHATE 10 MG/ML IJ SOLN
INTRAMUSCULAR | Status: AC
  Filled 2019-04-29: qty 1

## 2019-04-29 MED ORDER — SUGAMMADEX SODIUM 200 MG/2ML IV SOLN
INTRAVENOUS | Status: DC | PRN
  Administered 2019-04-29 (×2): 100 mg via INTRAVENOUS

## 2019-04-29 MED ORDER — ATROPINE SULFATE 1 % OP SOLN
OPHTHALMIC | Status: AC
  Filled 2019-04-29: qty 5

## 2019-04-29 MED ORDER — ATROPINE SULFATE 1 % OP SOLN
OPHTHALMIC | Status: DC | PRN
  Administered 2019-04-29: 1 [drp] via OPHTHALMIC

## 2019-04-29 MED ORDER — EPINEPHRINE PF 1 MG/ML IJ SOLN
INTRAMUSCULAR | Status: AC
  Filled 2019-04-29: qty 1

## 2019-04-29 MED ORDER — PHENYLEPHRINE 40 MCG/ML (10ML) SYRINGE FOR IV PUSH (FOR BLOOD PRESSURE SUPPORT)
PREFILLED_SYRINGE | INTRAVENOUS | Status: DC | PRN
  Administered 2019-04-29 (×2): 120 ug via INTRAVENOUS

## 2019-04-29 MED ORDER — ROCURONIUM BROMIDE 10 MG/ML (PF) SYRINGE
PREFILLED_SYRINGE | INTRAVENOUS | Status: AC
  Filled 2019-04-29: qty 30

## 2019-04-29 MED ORDER — EPINEPHRINE PF 1 MG/ML IJ SOLN
INTRAOCULAR | Status: DC | PRN
  Administered 2019-04-29: 500 mL

## 2019-04-29 MED ORDER — STERILE WATER FOR INJECTION IJ SOLN
INTRAMUSCULAR | Status: AC
  Filled 2019-04-29: qty 10

## 2019-04-29 MED ORDER — BRIMONIDINE TARTRATE 0.2 % OP SOLN
OPHTHALMIC | Status: AC
  Filled 2019-04-29: qty 5

## 2019-04-29 MED ORDER — ROCURONIUM BROMIDE 50 MG/5ML IV SOSY
PREFILLED_SYRINGE | INTRAVENOUS | Status: DC | PRN
  Administered 2019-04-29: 60 mg via INTRAVENOUS

## 2019-04-29 MED ORDER — DEXAMETHASONE SODIUM PHOSPHATE 10 MG/ML IJ SOLN
INTRAMUSCULAR | Status: DC | PRN
  Administered 2019-04-29: 10 mg via INTRAVENOUS

## 2019-04-29 MED ORDER — ONDANSETRON HCL 4 MG/2ML IJ SOLN
INTRAMUSCULAR | Status: DC | PRN
  Administered 2019-04-29: 4 mg via INTRAVENOUS

## 2019-04-29 MED ORDER — FENTANYL CITRATE (PF) 100 MCG/2ML IJ SOLN
25.0000 ug | INTRAMUSCULAR | Status: DC | PRN
  Administered 2019-04-29: 25 ug via INTRAVENOUS

## 2019-04-29 MED ORDER — BRIMONIDINE TARTRATE 0.2 % OP SOLN
OPHTHALMIC | Status: DC | PRN
  Administered 2019-04-29: 1 [drp] via OPHTHALMIC

## 2019-04-29 MED ORDER — TRIAMCINOLONE ACETONIDE 40 MG/ML IJ SUSP
INTRAMUSCULAR | Status: AC
  Filled 2019-04-29: qty 5

## 2019-04-29 MED ORDER — LIDOCAINE 2% (20 MG/ML) 5 ML SYRINGE
INTRAMUSCULAR | Status: DC | PRN
  Administered 2019-04-29: 100 mg via INTRAVENOUS

## 2019-04-29 MED ORDER — PHENYLEPHRINE HCL 10 % OP SOLN
1.0000 [drp] | OPHTHALMIC | Status: AC | PRN
  Administered 2019-04-29 (×3): 1 [drp] via OPHTHALMIC
  Filled 2019-04-29: qty 5

## 2019-04-29 MED ORDER — MIDAZOLAM HCL 2 MG/2ML IJ SOLN
INTRAMUSCULAR | Status: AC
  Filled 2019-04-29: qty 2

## 2019-04-29 MED ORDER — PHENYLEPHRINE HCL-NACL 10-0.9 MG/250ML-% IV SOLN
INTRAVENOUS | Status: DC | PRN
  Administered 2019-04-29: 40 ug/min via INTRAVENOUS

## 2019-04-29 MED ORDER — PREDNISOLONE ACETATE 1 % OP SUSP
OPHTHALMIC | Status: DC | PRN
  Administered 2019-04-29: 1 [drp] via OPHTHALMIC

## 2019-04-29 MED ORDER — EPHEDRINE 5 MG/ML INJ
INTRAVENOUS | Status: AC
  Filled 2019-04-29: qty 20

## 2019-04-29 MED ORDER — PREDNISOLONE ACETATE 1 % OP SUSP
OPHTHALMIC | Status: AC
  Filled 2019-04-29: qty 5

## 2019-04-29 MED ORDER — BACITRACIN-POLYMYXIN B 500-10000 UNIT/GM OP OINT
TOPICAL_OINTMENT | OPHTHALMIC | Status: DC | PRN
  Administered 2019-04-29: 1 via OPHTHALMIC

## 2019-04-29 MED ORDER — BSS IO SOLN
INTRAOCULAR | Status: AC
  Filled 2019-04-29: qty 15

## 2019-04-29 MED ORDER — TROPICAMIDE 1 % OP SOLN
1.0000 [drp] | OPHTHALMIC | Status: AC | PRN
  Administered 2019-04-29 (×3): 1 [drp] via OPHTHALMIC
  Filled 2019-04-29: qty 15

## 2019-04-29 MED ORDER — FENTANYL CITRATE (PF) 100 MCG/2ML IJ SOLN
INTRAMUSCULAR | Status: AC
  Filled 2019-04-29: qty 2

## 2019-04-29 MED ORDER — BACITRACIN-POLYMYXIN B 500-10000 UNIT/GM OP OINT
TOPICAL_OINTMENT | OPHTHALMIC | Status: AC
  Filled 2019-04-29: qty 3.5

## 2019-04-29 MED ORDER — CARBACHOL 0.01 % IO SOLN
INTRAOCULAR | Status: AC
  Filled 2019-04-29: qty 1.5

## 2019-04-29 MED ORDER — 0.9 % SODIUM CHLORIDE (POUR BTL) OPTIME
TOPICAL | Status: DC | PRN
  Administered 2019-04-29: 1000 mL

## 2019-04-29 MED ORDER — NA CHONDROIT SULF-NA HYALURON 40-30 MG/ML IO SOLN
INTRAOCULAR | Status: AC
  Filled 2019-04-29: qty 1

## 2019-04-29 MED ORDER — DORZOLAMIDE HCL-TIMOLOL MAL 2-0.5 % OP SOLN
OPHTHALMIC | Status: DC | PRN
  Administered 2019-04-29: 1 [drp] via OPHTHALMIC

## 2019-04-29 MED ORDER — FENTANYL CITRATE (PF) 100 MCG/2ML IJ SOLN: 25.0000 ug | INTRAMUSCULAR | Status: DC | PRN

## 2019-04-29 MED ORDER — LIDOCAINE 2% (20 MG/ML) 5 ML SYRINGE
INTRAMUSCULAR | Status: AC
  Filled 2019-04-29: qty 10

## 2019-04-29 MED ORDER — DORZOLAMIDE HCL-TIMOLOL MAL 2-0.5 % OP SOLN
OPHTHALMIC | Status: AC
  Filled 2019-04-29: qty 10

## 2019-04-29 MED ORDER — TRIAMCINOLONE ACETONIDE 40 MG/ML IJ SUSP
INTRAMUSCULAR | Status: DC | PRN
  Administered 2019-04-29: 5 mL via INTRAMUSCULAR

## 2019-04-29 MED ORDER — BSS PLUS IO SOLN: INTRAOCULAR | Status: DC | PRN

## 2019-04-29 MED ORDER — PROPARACAINE HCL 0.5 % OP SOLN
1.0000 [drp] | OPHTHALMIC | Status: AC | PRN
  Administered 2019-04-29 (×3): 1 [drp] via OPHTHALMIC
  Filled 2019-04-29: qty 15

## 2019-04-29 MED ORDER — SODIUM CHLORIDE (PF) 0.9 % IJ SOLN
INTRAMUSCULAR | Status: AC
  Filled 2019-04-29: qty 10

## 2019-04-29 MED ORDER — NA CHONDROIT SULF-NA HYALURON 40-30 MG/ML IO SOLN
INTRAOCULAR | Status: DC | PRN
  Administered 2019-04-29 (×2): 0.5 mL via INTRAOCULAR

## 2019-04-29 MED ORDER — PROPOFOL 10 MG/ML IV BOLUS
INTRAVENOUS | Status: DC | PRN
  Administered 2019-04-29: 170 mg via INTRAVENOUS

## 2019-04-29 MED ORDER — GATIFLOXACIN 0.5 % OP SOLN
OPHTHALMIC | Status: AC
  Filled 2019-04-29: qty 2.5

## 2019-04-29 MED ORDER — DEXAMETHASONE SODIUM PHOSPHATE 10 MG/ML IJ SOLN
INTRAMUSCULAR | Status: AC
  Filled 2019-04-29: qty 2

## 2019-04-29 MED ORDER — PHENYLEPHRINE 40 MCG/ML (10ML) SYRINGE FOR IV PUSH (FOR BLOOD PRESSURE SUPPORT)
PREFILLED_SYRINGE | INTRAVENOUS | Status: AC
  Filled 2019-04-29: qty 40

## 2019-04-29 MED ORDER — SUCCINYLCHOLINE CHLORIDE 200 MG/10ML IV SOSY
PREFILLED_SYRINGE | INTRAVENOUS | Status: AC
  Filled 2019-04-29: qty 20

## 2019-04-29 SURGICAL SUPPLY — 55 items
APPLICATOR COTTON TIP 6 STRL (MISCELLANEOUS) ×4 IMPLANT
APPLICATOR COTTON TIP 6IN STRL (MISCELLANEOUS) ×8
BAND WRIST GAS GREEN (MISCELLANEOUS) IMPLANT
BLADE EYE CATARACT 19 1.4 BEAV (BLADE) IMPLANT
BNDG EYE OVAL (GAUZE/BANDAGES/DRESSINGS) ×2 IMPLANT
CABLE BIPOLOR RESECTION CORD (MISCELLANEOUS) ×2 IMPLANT
CANNULA ANT CHAM MAIN (OPHTHALMIC RELATED) IMPLANT
CANNULA FLEX TIP 25G (CANNULA) ×2 IMPLANT
CLSR STERI-STRIP ANTIMIC 1/2X4 (GAUZE/BANDAGES/DRESSINGS) ×2 IMPLANT
DRAPE MICROSCOPE LEICA 46X105 (MISCELLANEOUS) ×2 IMPLANT
DRAPE OPHTHALMIC 77X100 STRL (CUSTOM PROCEDURE TRAY) ×2 IMPLANT
ERASER HMR WETFIELD 23G BP (MISCELLANEOUS) IMPLANT
FILTER BLUE MILLIPORE (MISCELLANEOUS) IMPLANT
FILTER STRAW FLUID ASPIR (MISCELLANEOUS) IMPLANT
FORCEPS GRIESHABER ILM 25G A (INSTRUMENTS) IMPLANT
GAS AUTO FILL CONSTEL (OPHTHALMIC)
GAS AUTO FILL CONSTELLATION (OPHTHALMIC) IMPLANT
GAS WRIST BAND GREEN (MISCELLANEOUS)
GLOVE BIO SURGEON STRL SZ7.5 (GLOVE) ×4 IMPLANT
GLOVE BIOGEL M 7.0 STRL (GLOVE) ×2 IMPLANT
GOWN STRL REUS W/ TWL LRG LVL3 (GOWN DISPOSABLE) ×2 IMPLANT
GOWN STRL REUS W/ TWL XL LVL3 (GOWN DISPOSABLE) ×1 IMPLANT
GOWN STRL REUS W/TWL LRG LVL3 (GOWN DISPOSABLE) ×4
GOWN STRL REUS W/TWL XL LVL3 (GOWN DISPOSABLE) ×2
KIT BASIN OR (CUSTOM PROCEDURE TRAY) ×2 IMPLANT
KIT PERFLUORON PROCEDURE 5ML (MISCELLANEOUS) IMPLANT
LENS VITRECTOMY FLAT OCLR DISP (MISCELLANEOUS) IMPLANT
LOOP FINESSE 25 GA (MISCELLANEOUS) ×1 IMPLANT
MICROPICK 25G (MISCELLANEOUS)
NDL 18GX1X1/2 (RX/OR ONLY) (NEEDLE) ×1 IMPLANT
NDL 25GX 5/8IN NON SAFETY (NEEDLE) ×3 IMPLANT
NDL HYPO 30X.5 LL (NEEDLE) IMPLANT
NEEDLE 18GX1X1/2 (RX/OR ONLY) (NEEDLE) ×2 IMPLANT
NEEDLE 25GX 5/8IN NON SAFETY (NEEDLE) ×6 IMPLANT
NEEDLE HYPO 30X.5 LL (NEEDLE) IMPLANT
PACK VITRECTOMY CUSTOM (CUSTOM PROCEDURE TRAY) ×2 IMPLANT
PAD ARMBOARD 7.5X6 YLW CONV (MISCELLANEOUS) ×4 IMPLANT
PAK PIK VITRECTOMY CVS 25GA (OPHTHALMIC) ×2 IMPLANT
PENCIL BIPOLAR 25GA STR DISP (OPHTHALMIC RELATED) IMPLANT
PICK MICROPICK 25G (MISCELLANEOUS) IMPLANT
PROBE ENDO DIATHERMY 25G (MISCELLANEOUS) ×2 IMPLANT
PROBE LASER ILLUM FLEX CVD 25G (OPHTHALMIC) ×1 IMPLANT
REPL STRA BRUSH NDL (NEEDLE) IMPLANT
REPL STRA BRUSH NEEDLE (NEEDLE) IMPLANT
RESERVOIR BACK FLUSH (MISCELLANEOUS) IMPLANT
RETRACTOR IRIS FLEX 25G GRIESH (INSTRUMENTS) ×1 IMPLANT
STOPCOCK 4 WAY LG BORE MALE ST (IV SETS) IMPLANT
SUT VICRYL 7 0 TG140 8 (SUTURE) ×2 IMPLANT
SYR 10ML LL (SYRINGE) ×2 IMPLANT
SYR 20ML LL LF (SYRINGE) IMPLANT
SYR 5ML LL (SYRINGE) IMPLANT
SYR TB 1ML LUER SLIP (SYRINGE) ×6 IMPLANT
TOWEL GREEN STERILE FF (TOWEL DISPOSABLE) ×2 IMPLANT
TUBING HIGH PRESS EXTEN 6IN (TUBING) ×2 IMPLANT
WATER STERILE IRR 1000ML POUR (IV SOLUTION) ×2 IMPLANT

## 2019-04-29 NOTE — Transfer of Care (Signed)
Immediate Anesthesia Transfer of Care Note  Patient: Angela [name]  Procedure(s) Performed: 25 GAUGE PARS PLANA VITRECTOMY WITH 20 GAUGE MVR PORT FOR MACULAR HOLE (Right Eye)  Patient Location: PACU  Anesthesia Type:General  Level of Consciousness: awake  Airway & Oxygen Therapy: Patient Spontanous Breathing and Patient connected to nasal cannula oxygen  Post-op Assessment: Report given to RN and Post -op Vital signs reviewed and stable  Post vital signs: Reviewed and stable  Last Vitals:  Vitals Value Taken Time  BP 135/83 04/29/19 1510  Temp    Pulse 87 04/29/19 1510  Resp 13 04/29/19 1510  SpO2 93 % 04/29/19 1510  Vitals shown include unvalidated device data.  Last Pain:  Vitals:   04/29/19 0952  TempSrc:   PainSc: 4       Patients Stated Pain Goal: 4 ([id number])  Complications: No apparent anesthesia complications

## 2019-04-29 NOTE — Discharge Instructions (Addendum)
POSTOPERATIVE INSTRUCTIONS  Your doctor has performed vitreoretinal surgery on you at . [facility].  - Keep eye patched and shielded until seen by Dr. Zamora 8 AM tomorrow in clinic - Do not use drops until return - FACE DOWN POSITIONING WHILE AWAKE - Sleep with belly down or on left side, avoid laying flat on back.    - No strenuous bending, stooping or lifting.  - You may not drive until further notice.  - If your doctor used a gas bubble in your eye during the procedure he will advise you on postoperative positioning. If you have a gas bubble you will be wearing a green bracelet that was applied in the operating room. The green bracelet should stay on as long as the gas bubble is in your eye. While the gas bubble is present you should not fly in an airplane. If you require general anesthesia while the gas bubble is present you must notify your anesthesiologist that an intraocular gas bubble is present so he can take the appropriate precautions.  - Tylenol or any other over-the-counter pain reliever can be used according to your doctor. If more pain medicine is required, your doctor will have a prescription for you.  - You may read, go up and down stairs, and watch television.     Brian Zamora, M.D., Ph.D.  

## 2019-04-29 NOTE — Anesthesia Postprocedure Evaluation (Signed)
Anesthesia Post Note  Patient: Angela [name]  Procedure(s) Performed: 25 GAUGE PARS PLANA VITRECTOMY WITH 20 GAUGE MVR PORT FOR MACULAR HOLE (Right Eye)     Patient location during evaluation: PACU Anesthesia Type: General Level of consciousness: awake and alert, patient cooperative and oriented Pain management: pain level controlled Vital Signs Assessment: post-procedure vital signs reviewed and stable Respiratory status: spontaneous breathing, nonlabored ventilation and respiratory function stable Cardiovascular status: blood pressure returned to baseline and stable Postop Assessment: no apparent nausea or vomiting Anesthetic complications: no Comments: Pain and nausea improved, pt states she is feeling much better    Last Vitals:  Vitals:   04/29/19 1539 04/29/19 1550  BP: (!) 127/91 (!) 136/96  Pulse: 80 78  Resp: 16 19  Temp:    SpO2: 95% 93%    Last Pain:  Vitals:   04/29/19 1539  TempSrc:   PainSc: 7                  Shuronda Santino,E. Kristina Bertone

## 2019-04-29 NOTE — Anesthesia Preprocedure Evaluation (Addendum)
Anesthesia Evaluation  Patient identified by MRN, date of birth, ID band Patient awake    Reviewed: Allergy & Precautions, NPO status , Patient's Chart, lab work & pertinent test results  History of Anesthesia Complications Negative for: history of anesthetic complications  Airway Mallampati: II  TM Distance: >3 FB Neck ROM: Full    Dental   Pulmonary asthma , sleep apnea ,    Pulmonary exam normal        Cardiovascular negative cardio ROS Normal cardiovascular exam     Neuro/Psych negative neurological ROS  negative psych ROS   GI/Hepatic Neg liver ROS, GERD  ,  Endo/Other  Hypothyroidism Morbid obesity  Renal/GU negative Renal ROS  negative genitourinary   Musculoskeletal negative musculoskeletal ROS (+)   Abdominal   Peds  Hematology negative hematology ROS (+)   Anesthesia Other Findings   Reproductive/Obstetrics                            Anesthesia Physical Anesthesia Plan  ASA: III  Anesthesia Plan: General   Post-op Pain Management:    Induction: Intravenous  PONV Risk Score and Plan: 3 and Ondansetron, Dexamethasone, Treatment may vary due to age or medical condition and Midazolam  Airway Management Planned: Oral ETT  Additional Equipment: None  Intra-op Plan:   Post-operative Plan: Extubation in OR  Informed Consent: I have reviewed the patients History and Physical, chart, labs and discussed the procedure including the risks, benefits and alternatives for the proposed anesthesia with the patient or authorized representative who has indicated his/her understanding and acceptance.     Dental advisory given  Plan Discussed with:   Anesthesia Plan Comments:         Anesthesia Quick Evaluation

## 2019-04-29 NOTE — Progress Notes (Addendum)
[facility] Note  04/30/2019     CHIEF COMPLAINT Patient presents for Post-op Follow-up   HISTORY OF PRESENT ILLNESS: Angela [name] is a 64 y.o. female who presents to the clinic today for:   HPI    Post-op Follow-up    In right eye.  Discomfort includes pain.  Vision is stable.  I, the attending physician,  performed the HPI with the patient and updated documentation appropriately.          Comments    Pt states she feels like she has a large grain of sand in her right eye and some mild pain relieved with tylenol.       Last edited by Bernarda Caffey, MD on 04/30/2019  8:09 AM. (History)    pt states she had a hard time figuring out how to sleep last night, but overall, she is doing well  Referring physician: Alroy Dust, L.[name], [address] [place],  [place] [address]  HISTORICAL INFORMATION:   Selected notes from the MEDICAL RECORD NUMBER Referred by Dr. Shirley Muscat for mac hole OD   CURRENT MEDICATIONS: Current Outpatient Medications (Ophthalmic Drugs)  Medication Sig  . brimonidine (ALPHAGAN P) 0.1 % SOLN Place 1 drop into both eyes 2 (two) times daily.  . carboxymethylcellulose (REFRESH TEARS) 0.5 % SOLN Place 1 drop into both eyes 3 (three) times daily as needed (dry eyes).   No current facility-administered medications for this visit. (Ophthalmic Drugs)   Current Outpatient Medications (Other)  Medication Sig  . acetaminophen (TYLENOL) 500 MG tablet Take 1,000 mg by mouth every 6 (six) hours as needed for moderate pain or headache.  . albuterol (PROVENTIL) (2.5 MG/3ML) 0.083% nebulizer solution Take 2.5 mg by nebulization every 6 (six) hours as needed for wheezing or shortness of breath.  . calcium carbonate (TUMS - DOSED IN MG ELEMENTAL CALCIUM) 500 MG chewable tablet Chew 2 tablets by mouth daily as needed for indigestion or heartburn.  . Dextromethorphan-guaiFENesin (TUSSIN DM MAX ADULT) 10-200 MG/5ML LIQD Take 10 mLs by  mouth daily as needed (cough).  . diphenhydrAMINE (BENADRYL) 25 MG tablet Take 25 mg by mouth every 6 (six) hours as needed for itching or allergies.  . hydrocortisone cream 1 % Apply 1 application topically 2 (two) times daily as needed (rash).  Marland Kitchen letrozole (FEMARA) 2.5 MG tablet Take 1 tablet (2.5 mg total) by mouth daily. (Patient taking differently: Take 2.5 mg by mouth every evening. )  . levothyroxine (SYNTHROID, LEVOTHROID) 125 MCG tablet Take 125 mcg by mouth daily before breakfast.  . loratadine (CLARITIN) 10 MG tablet Take 10 mg by mouth daily.  . montelukast (SINGULAIR) 10 MG tablet Take 10 mg by mouth at bedtime.  . triamcinolone (NASACORT AQ) 55 MCG/ACT AERO nasal inhaler Place 1 spray into the nose daily as needed (allergies).    No current facility-administered medications for this visit. (Other)      REVIEW OF SYSTEMS: ROS    Positive for: Eyes   Negative for: Constitutional, Gastrointestinal, Neurological, Skin, Genitourinary, Musculoskeletal, HENT, Endocrine, Cardiovascular, Respiratory, Psychiatric, Allergic/Imm, Heme/Lymph   Last edited by Doneen Poisson on 04/30/2019  7:55 AM. (History)       ALLERGIES Allergies  Allergen Reactions  . Other Other (See Comments)    NO BLOOD PRODUCTS - PT IN AGREEMENT FOR ALBUMIN OR ALBUMIN CONTAINING PRODUCTS PER CONSENT  . Aspirin Other (See Comments)    Stomach ulcers   . Ibuprofen Other (See  Comments)    Cautious by MD due to kidney function     PAST MEDICAL HISTORY Past Medical History:  Diagnosis Date  . Anxiety   . Asthma   . Asthma due to seasonal allergies   . Breast cancer ([facility]) 07/2017   Left Breast Cancer  . Breast discharge   . Congestion of right ear   . Depression   . GERD (gastroesophageal reflux disease)   . Headache(784.0)   . Hyperlipidemia   . Hypothyroidism   . Seizures ([facility])    hx of during childhood -not since age 36  . Sleep apnea    uses CPAP  . Thyroid disease   . Tinnitus   .  Urticaria    Past Surgical History:  Procedure Laterality Date  . [place] VITRECTOMY WITH 20 GAUGE MVR PORT FOR MACULAR HOLE Right 04/29/2019   Procedure: 25 GAUGE PARS PLANA VITRECTOMY WITH 20 GAUGE MVR PORT FOR MACULAR HOLE;  Surgeon: Bernarda Caffey, MD;  Location: [facility];  Service: Ophthalmology;  Laterality: Right;  . ANTERIOR AND POSTERIOR REPAIR  2007  . BREAST DUCTAL SYSTEM EXCISION  01/18/2011   Procedure: EXCISION DUCTAL SYSTEM BREAST;  Surgeon: Belva Crome, MD;  Location: [facility];  Service: General;  Laterality: Right;  . BREAST EXCISIONAL BIOPSY Right   . BREAST RECONSTRUCTION WITH PLACEMENT OF TISSUE EXPANDER AND ALLODERM Left 01/06/2018   Procedure: BREAST RECONSTRUCTION WITH PLACEMENT OF TISSUE EXPANDER AND ALLODERM;  Surgeon: Irene Limbo, MD;  Location: [facility];  Service: Plastics;  Laterality: Left;  . BREAST SURGERY  01/18/2011   Right BX  . CYSTOSCOPY WITH URETHROLYSIS N/A 11/01/2014   Procedure: CYSTOSCOPY WITH URETHROLYSIS;  Surgeon: Bjorn Loser, MD;  Location: [facility];  Service: Urology;  Laterality: N/A;  . DILATION AND CURETTAGE OF UTERUS    . MASTECTOMY Left 12/2017  . PUBOVAGINAL SLING N/A 11/01/2014   Procedure: REMOVAL VAGINAL SLING AND URETHROLYSIS AND CYSTO;  Surgeon: Bjorn Loser, MD;  Location: [facility];  Service: Urology;  Laterality: N/A;  . REMOVAL OF TISSUE EXPANDER AND PLACEMENT OF IMPLANT Left 07/14/2018   Procedure: REMOVAL OF  LEFT TISSUE EXPANDER AND PLACEMENT OF IMPLANT;  Surgeon: Irene Limbo, MD;  Location: [facility];  Service: Plastics;  Laterality: Left;  . TUBAL LIGATION  1998  . TYMPANOSTOMY TUBE PLACEMENT  2010   right ear   . WISDOM TOOTH EXTRACTION      FAMILY HISTORY Family History  Problem Relation Age of Onset  . Cancer Father        throat  . Cancer Maternal Aunt        breast  . Breast cancer Maternal Aunt        in 36's  . Allergic rhinitis Neg  Hx   . Asthma Neg Hx   . Eczema Neg Hx   . Immunodeficiency Neg Hx   . Urticaria Neg Hx     SOCIAL HISTORY Social History   Tobacco Use  . Smoking status: Never Smoker  . Smokeless tobacco: Never Used  Substance Use Topics  . Alcohol use: No  . Drug use: No         OPHTHALMIC EXAM:  Base Eye Exam    Visual Acuity (Snellen - Linear)      Right Left   Dist [place] HM Def   Dist ph [place] NI        Tonometry (Tonopen, 8:02 AM)  Right Left   Pressure 26 def       Pupils      Dark Light Shape React APD   Right 7 7 Round dilated 0   Left 3 2 Round Brisk 0       Visual Fields      Left Right    Full    Restrictions  Total superior temporal, inferior temporal, superior nasal, inferior nasal deficiencies       Neuro/Psych    Oriented x3: Yes   Mood/Affect: Normal       Dilation    Right eye: 1.0% Mydriacyl, 2.5% Phenylephrine @ 8:02 AM        Slit Lamp and Fundus Exam    Slit Lamp Exam      Right Left   Lids/Lashes Dermatochalasis - upper lid, mild Meibomian gland dysfunction Dermatochalasis - upper lid, mild Meibomian gland dysfunction   Conjunctiva/Sclera Subconjunctival hemorrhage, sutures intact, mild pneumatic Chemosis White and quiet   Cornea Arcus, trace Punctate epithelial erosions, focal endo heme at 0530 Arcus, trace Punctate epithelial erosions   Anterior Chamber Deep, 2+RBC/pigment Deep and quiet   Iris Round and dilated Round and dilated   Lens 2+ Nuclear sclerosis, 2+ Cortical cataract, 1+PC feathering 2+ Nuclear sclerosis, 2+ Cortical cataract   Vitreous post vitrectomy, good gas fill Vitreous syneresis, vitreous condensations       Fundus Exam      Right Left   Disc Pink and Sharp Pink and Sharp   C/D Ratio 0.5 0.5   Macula hazy view, mac hole closing under gas, focal IRH temporal macula Flat, Blunted foveal reflex, Retinal pigment epithelial mottling, No heme or edema   Vessels mild Vascular attenuation mild Vascular attenuation    Periphery Hazy view, attached, good 360 laser in place, No heme  Attached           Refraction    Wearing Rx      Sphere Cylinder Axis   Right -6.50 +1.00 107   Left -6.75 +1.25 060   Type: progressive          IMAGING AND PROCEDURES  Imaging and Procedures for _0 @           ASSESSMENT/PLAN:    ICD-10-CM   1. Macular hole of right eye  H35.341   2. Vitreomacular adhesion of right eye  H43.821   3. Retinal edema  H35.81   4. Combined forms of age-related cataract of both eyes  H25.813     1-3. Macular hole with CME and residual vitreous traction OD  - +distortion and decreased vision OD  - pre op BCVA 20/70  - OCT showed macular hole w/ persistent CME and VMT  - now POD1 s/p PPV/TissueBlue Stain/MP/14% C3F8 OS, 04.01.21             - doing well this morning             - retina attached and good gas bubble in place w/ mac hole closing             - IOP mildly elevated at 26             - start   PF 4x/day OD                          zymaxid QID OD  Atropine BID OD   Brimonidine BID OD                         Cosopt BID OD                         PSO ung QID OD              - cont face down positioning x3 days then can decrease positioning to 50% of time; avoid laying flat on back              - eye shield when sleeping              - post op drop and positioning instructions reviewed              - tylenol/ibuprofen for pain  - f/u 1 week  4. Mixed form age-related cataracts OU  - The symptoms of cataract, surgical options, and treatments and risks were discussed with patient.  - discussed diagnosis and progression  - not yet visually significant  - discussed likelihood of cataract progression OD post PPV  - monitor for now   Ophthalmic Meds Ordered this visit:  No orders of the defined types were placed in this encounter.      Return in about 1 week (around 05/07/2019) for f/u [place] OD, DFE OD.  There are no Patient  Instructions on file for this visit.   Explained the diagnoses, plan, and follow up with the patient and they expressed understanding.  Patient expressed understanding of the importance of proper follow up care.   This document serves as a record of services personally performed by Gardiner Sleeper, MD, PhD. It was created on their behalf by Ernest Mallick, OA, an ophthalmic assistant. The creation of this record is the provider's dictation and/or activities during the visit.    Electronically signed by: Ernest Mallick, OA 04.01.2021 12:47 PM  Gardiner Sleeper, M.D., Ph.D. Diseases & Surgery of the Retina and Vitreous [facility]  I have reviewed the above documentation for accuracy and completeness, and I agree with the above. Gardiner Sleeper, M.D., Ph.D. 04/30/19 12:47 PM    Abbreviations: M myopia (nearsighted); A astigmatism; H hyperopia (farsighted); P presbyopia; Mrx spectacle prescription;  CTL contact lenses; OD right eye; OS left eye; OU both eyes  XT exotropia; ET esotropia; PEK punctate epithelial keratitis; PEE punctate epithelial erosions; DES dry eye syndrome; MGD meibomian gland dysfunction; ATs artificial tears; PFAT's preservative free artificial tears; [place] nuclear sclerotic cataract; PSC posterior subcapsular cataract; ERM epi-retinal membrane; PVD posterior vitreous detachment; RD retinal detachment; DM diabetes mellitus; DR diabetic retinopathy; NPDR non-proliferative diabetic retinopathy; PDR proliferative diabetic retinopathy; CSME clinically significant macular edema; DME diabetic macular edema; dbh dot blot hemorrhages; CWS cotton wool spot; POAG primary open angle glaucoma; C/D cup-to-disc ratio; HVF humphrey visual field; GVF goldmann visual field; OCT optical coherence tomography; IOP intraocular pressure; BRVO Branch retinal vein occlusion; CRVO central retinal vein occlusion; CRAO central retinal artery occlusion; BRAO branch retinal artery occlusion;  RT retinal tear; SB scleral buckle; PPV pars plana vitrectomy; VH Vitreous hemorrhage; PRP panretinal laser photocoagulation; IVK intravitreal kenalog; VMT vitreomacular traction; MH Macular hole;  NVD neovascularization of the disc; NVE neovascularization elsewhere; AREDS age related eye disease study; ARMD age related macular degeneration; POAG primary open angle glaucoma; EBMD epithelial/anterior basement membrane dystrophy; ACIOL anterior chamber intraocular lens; IOL  intraocular lens; PCIOL posterior chamber intraocular lens; Phaco/IOL phacoemulsification with intraocular lens placement; [place] photorefractive keratectomy; LASIK laser assisted in situ keratomileusis; HTN hypertension; DM diabetes mellitus; COPD chronic obstructive pulmonary disease

## 2019-04-29 NOTE — Brief Op Note (Signed)
04/29/2019  3:10 PM  PATIENT:  Angela [name]  64 y.o. female  PRE-OPERATIVE DIAGNOSIS:  right eye macular hole  POST-OPERATIVE DIAGNOSIS:  right eye macular hole  PROCEDURE:  Procedure(s): 25 GAUGE PARS PLANA VITRECTOMY WITH 20 GAUGE MVR PORT FOR MACULAR HOLE (Right)  SURGEON:  Surgeon(s) and Role:    Bernarda Caffey, MD - Primary  ANESTHESIA:   general  EBL:  5 mL   BLOOD ADMINISTERED:none  DRAINS: none   LOCAL MEDICATIONS USED:  NONE  SPECIMEN:  No Specimen  DISPOSITION OF SPECIMEN:  N/A  COUNTS:  YES  TOURNIQUET:  * No tourniquets in log *  DICTATION: .Note written in EPIC  PLAN OF CARE: Discharge to home after PACU  PATIENT DISPOSITION:  PACU - hemodynamically stable.   Delay start of Pharmacological VTE agent (>24hrs) due to surgical blood loss or risk of bleeding: not applicable

## 2019-04-29 NOTE — Anesthesia Procedure Notes (Signed)
Procedure Name: Intubation Date/Time: 04/29/2019 12:40 PM Performed by: Shirlyn Goltz, CRNA Pre-anesthesia Checklist: Patient identified, Emergency Drugs available, Suction available and Patient being monitored Patient Re-evaluated:Patient Re-evaluated prior to induction Oxygen Delivery Method: Circle system utilized Preoxygenation: Pre-oxygenation with 100% oxygen Induction Type: IV induction Ventilation: Mask ventilation with difficulty Laryngoscope Size: Glidescope and 4 Grade View: Grade II Tube type: Oral Tube size: 7.0 mm Number of attempts: 2 Airway Equipment and Method: Video-laryngoscopy,  Bougie stylet and Rigid stylet Placement Confirmation: ETT inserted through vocal cords under direct vision,  positive ETCO2 and breath sounds checked- equal and bilateral Secured at: 22 cm Tube secured with: Tape Dental Injury: Teeth and Oropharynx as per pre-operative assessment  Difficulty Due To: Difficulty was anticipated, Difficult Airway- due to large tongue and Difficult Airway- due to reduced neck mobility

## 2019-04-29 NOTE — Op Note (Signed)
Date of procedure:  04/29/2018   Surgeon: Bernarda Caffey, MD, PhD   Pre-operative Diagnoses: Full thickness macular hole, Right Eye   Post-operative diagnosis: Full thickness macular hole, Right Eye   Anesthesia: General   Procedure:   1. 25 gauge pars plana vitrectomy, Right Eye 2. TissueBlue stain, Right Eye 3. Internal Limiting Membrane peel, Right Eye  4. Endolaser, Right Eye 5. Injection 13% C3F8, Right Eye     Indications for procedure: The patient presented with a full thickness macular hole, which was caught on routine eye exam. After discussing the risks, benefits, and alternatives to surgery, the patient elected to undergo surgical repair and informed consent was obtained.  The surgery was an attempt to close the macular hole and potentially improve the vision within the reasonable expectations of the surgeon.    Procedure in Detail:    The patient was met in the pre-operative holding area where their identification data was verified.  It was noted that there was a signed, informed consent in the chart and the Right Eye eye was verbally verified by the patient as the operative eye and was marked with a marking pen. The patient was then taken to the operating room and placed in the supine position. General endotracheal anesthesia was induced.   The eye was then prepped with 5% betadine and draped in the normal fashion for ophthalmic surgery. The microscope was draped and swung into position, and a secondary time-out was performed to identify the correct patient, eye, procedures, and any allergies.   A 25 gauge trocar was inserted in a 30-45 degrees fashion into the inferotemporal quadrant 4 mm posterior to the limbus in this phakic patient.  Correct positioning within the vitreous was verified externally with the light pipe.  The infusion was then connected to the cannula and BSS infusion was commenced.  Additional ports were placed in the superonasal and superotemporal quadrants.  Viscoat was placed on the cornea. The BIOM was used to visualize the posterior segment while the core vitrectomy was completed.  The patient had a visible full thickness macular hole and a posterior vitreous detachment was induced using suction over the optic nerve head and lifting anteriorly. The remaining vitreous was removed. Kenalog was used to aid in this process. A thorough peripheral vitrectomy was performed, removing the vitreous to the vitreous base.      TissueBlue was then used to stain the internal limiting membrane. A macular contact lens was placed on the eye. End-grasping ILM forceps were used to create an opening in the ILM and the ILM was peeled fully from the macula taking care to avoid traction on the macular hole.    The wide angle viewing system was brought back into position. Scleral depression was performed and used to inspect the peripheral retina. On 360 inspection, there were no retinal tears or defects. Peripheral 360 prophylactic endolaser was placed just posterior to ora under scleral depression. An air fluid exchange was performed.    The superotemporal port was then removed and sutured with 7-0 vicryl, there was no leakage. 13% C3F8 gas was connected to the infusion line and gas was injected into the posterior segment while venting air through the superonasal trocar using the extrusion cannula. Once a full, 40cc of gas was vented through the eye, the infusion port and venting ports were removed and they were sutured with 7-0 vicryl. There was no leakage from the sclerotomy sites.   Subconjunctival injections of kefzol + bacitracin + polymixin b  and kenalog were then administered, and antibiotic and steroid drops as well as antibiotic ointment were placed in the eye.  The drapes were removed and the eye was patched and shielded.  A green gas bracelet was placed on the patient's wrist. The patient was then taken to the post-operative area for recovery having tolerated the  procedure well.  Shee was instructed to perform face down positioning postoperatively and to follow up in clinic the following morning as scheduled.   Estimate blood lost: minimal Complications: None  

## 2019-04-30 ENCOUNTER — Ambulatory Visit (INDEPENDENT_AMBULATORY_CARE_PROVIDER_SITE_OTHER): Payer: 59 | Admitting: Ophthalmology

## 2019-04-30 ENCOUNTER — Encounter (INDEPENDENT_AMBULATORY_CARE_PROVIDER_SITE_OTHER): Payer: Self-pay | Admitting: Ophthalmology

## 2019-04-30 DIAGNOSIS — H3581 Retinal edema: Secondary | ICD-10-CM

## 2019-04-30 DIAGNOSIS — H25813 Combined forms of age-related cataract, bilateral: Secondary | ICD-10-CM

## 2019-04-30 DIAGNOSIS — H35341 Macular cyst, hole, or pseudohole, right eye: Secondary | ICD-10-CM

## 2019-04-30 DIAGNOSIS — H43821 Vitreomacular adhesion, right eye: Secondary | ICD-10-CM

## 2019-05-04 NOTE — Progress Notes (Signed)
[facility] Note  05/07/2019     CHIEF COMPLAINT Patient presents for Post-op Follow-up   HISTORY OF PRESENT ILLNESS: Angela [name] is a 64 y.o. female who presents to the clinic today for:   HPI    Post-op Follow-up    In right eye (Soreness and dull, stabbing pain (intermittent)).  Discomfort includes pain.  Negative for itching, foreign body sensation, tearing, discharge, floaters and none.  Vision is blurred at distance and is blurred at near.          Comments    Patient states vision still blurred OD. Has occasional dull, stabbing pains OD. Lost tube of polysporin ung after using last night. Still taking zymaxid, cosopt, birmonidine, atropine, and cosopt.        Last edited by Roselee Nova D, COT on 05/07/2019  2:01 PM. (History)    pt states she has been sleeping with her face down and using the drops as directed  Referring physician: Alroy Dust, L.[name], [place] [address] [place],  [place] [address]  HISTORICAL INFORMATION:   Selected notes from the MEDICAL RECORD NUMBER Referred by Dr. Shirley Muscat for mac hole OD   CURRENT MEDICATIONS: Current Outpatient Medications (Ophthalmic Drugs)  Medication Sig  . atropine 1 % ophthalmic solution Place 1 drop into the right eye in the morning and at bedtime.  . bacitracin-polymyxin b (POLYSPORIN) ophthalmic ointment Place 1 application into the right eye at bedtime. apply to right eye as needed and at bedtime  . brimonidine (ALPHAGAN P) 0.1 % SOLN Place 1 drop into both eyes 3 (three) times daily.  . carboxymethylcellulose (REFRESH TEARS) 0.5 % SOLN Place 1 drop into both eyes 3 (three) times daily as needed (dry eyes).  . dorzolamide-timolol (COSOPT) 22.3-6.8 MG/ML ophthalmic solution Place 1 drop into the right eye 3 (three) times daily.  Marland Kitchen gatifloxacin (ZYMAXID) 0.5 % SOLN Place 1 drop into the right eye 4 (four) times daily.  . prednisoLONE acetate (PRED FORTE) 1 % ophthalmic suspension  Place 1 drop into the right eye 3 (three) times daily.   No current facility-administered medications for this visit. (Ophthalmic Drugs)   Current Outpatient Medications (Other)  Medication Sig  . acetaminophen (TYLENOL) 500 MG tablet Take 1,000 mg by mouth every 6 (six) hours as needed for moderate pain or headache.  . albuterol (PROVENTIL) (2.5 MG/3ML) 0.083% nebulizer solution Take 2.5 mg by nebulization every 6 (six) hours as needed for wheezing or shortness of breath.  . calcium carbonate (TUMS - DOSED IN MG ELEMENTAL CALCIUM) 500 MG chewable tablet Chew 2 tablets by mouth daily as needed for indigestion or heartburn.  . Dextromethorphan-guaiFENesin (TUSSIN DM MAX ADULT) 10-200 MG/5ML LIQD Take 10 mLs by mouth daily as needed (cough).  . diphenhydrAMINE (BENADRYL) 25 MG tablet Take 25 mg by mouth every 6 (six) hours as needed for itching or allergies.  . hydrocortisone cream 1 % Apply 1 application topically 2 (two) times daily as needed (rash).  Marland Kitchen letrozole (FEMARA) 2.5 MG tablet Take 1 tablet (2.5 mg total) by mouth daily.  Marland Kitchen levothyroxine (SYNTHROID, LEVOTHROID) 125 MCG tablet Take 125 mcg by mouth daily before breakfast.  . loratadine (CLARITIN) 10 MG tablet Take 10 mg by mouth daily.  . montelukast (SINGULAIR) 10 MG tablet Take 10 mg by mouth at bedtime.  . triamcinolone (NASACORT AQ) 55 MCG/ACT AERO nasal inhaler Place 1 spray into the nose daily as needed (allergies).    No  current facility-administered medications for this visit. (Other)      REVIEW OF SYSTEMS: ROS    Positive for: Eyes   Negative for: Constitutional, Gastrointestinal, Neurological, Skin, Genitourinary, Musculoskeletal, HENT, Endocrine, Cardiovascular, Respiratory, Psychiatric, Allergic/Imm, Heme/Lymph   Last edited by Roselee Nova D, COT on 05/07/2019  1:54 PM. (History)       ALLERGIES Allergies  Allergen Reactions  . Other Other (See Comments)    NO BLOOD PRODUCTS - PT IN AGREEMENT FOR ALBUMIN OR  ALBUMIN CONTAINING PRODUCTS PER CONSENT  . Aspirin Other (See Comments)    Stomach ulcers   . Ibuprofen Other (See Comments)    Cautious by MD due to kidney function     PAST MEDICAL HISTORY Past Medical History:  Diagnosis Date  . Anxiety   . Asthma   . Asthma due to seasonal allergies   . Breast cancer ([facility]) 07/2017   Left Breast Cancer  . Breast discharge   . Congestion of right ear   . Depression   . GERD (gastroesophageal reflux disease)   . Headache(784.0)   . Hyperlipidemia   . Hypothyroidism   . Seizures ([place])    hx of during childhood -not since age 64  . Sleep apnea    uses CPAP  . Thyroid disease   . Tinnitus   . Urticaria    Past Surgical History:  Procedure Laterality Date  . [place] VITRECTOMY WITH 20 GAUGE MVR PORT FOR MACULAR HOLE Right 04/29/2019   Procedure: 25 GAUGE PARS PLANA VITRECTOMY WITH 20 GAUGE MVR PORT FOR MACULAR HOLE;  Surgeon: Bernarda Caffey, MD;  Location: [facility];  Service: Ophthalmology;  Laterality: Right;  . ANTERIOR AND POSTERIOR REPAIR  2007  . BREAST DUCTAL SYSTEM EXCISION  01/18/2011   Procedure: EXCISION DUCTAL SYSTEM BREAST;  Surgeon: Belva Crome, MD;  Location: [facility];  Service: General;  Laterality: Right;  . BREAST EXCISIONAL BIOPSY Right   . BREAST RECONSTRUCTION WITH PLACEMENT OF TISSUE EXPANDER AND ALLODERM Left 01/06/2018   Procedure: BREAST RECONSTRUCTION WITH PLACEMENT OF TISSUE EXPANDER AND ALLODERM;  Surgeon: Irene Limbo, MD;  Location: [facility];  Service: Plastics;  Laterality: Left;  . BREAST SURGERY  01/18/2011   Right BX  . CYSTOSCOPY WITH URETHROLYSIS N/A 11/01/2014   Procedure: CYSTOSCOPY WITH URETHROLYSIS;  Surgeon: Bjorn Loser, MD;  Location: [facility];  Service: Urology;  Laterality: N/A;  . DILATION AND CURETTAGE OF UTERUS    . MASTECTOMY Left 12/2017  . PUBOVAGINAL SLING N/A 11/01/2014   Procedure: REMOVAL VAGINAL SLING AND URETHROLYSIS AND CYSTO;   Surgeon: Bjorn Loser, MD;  Location: [facility];  Service: Urology;  Laterality: N/A;  . REMOVAL OF TISSUE EXPANDER AND PLACEMENT OF IMPLANT Left 07/14/2018   Procedure: REMOVAL OF  LEFT TISSUE EXPANDER AND PLACEMENT OF IMPLANT;  Surgeon: Irene Limbo, MD;  Location: [facility];  Service: Plastics;  Laterality: Left;  . TUBAL LIGATION  1998  . TYMPANOSTOMY TUBE PLACEMENT  2010   right ear   . WISDOM TOOTH EXTRACTION      FAMILY HISTORY Family History  Problem Relation Age of Onset  . Cancer Father        throat  . Cancer Maternal Aunt        breast  . Breast cancer Maternal Aunt        in 41's  . Allergic rhinitis Neg Hx   . Asthma Neg Hx   . Eczema Neg Hx   .  Immunodeficiency Neg Hx   . Urticaria Neg Hx     SOCIAL HISTORY Social History   Tobacco Use  . Smoking status: Never Smoker  . Smokeless tobacco: Never Used  Substance Use Topics  . Alcohol use: No  . Drug use: No         OPHTHALMIC EXAM:  Base Eye Exam    Visual Acuity (Snellen - Linear)      Right Left   Dist cc HM 20/30   Dist ph cc NI NI   Correction: Glasses       Tonometry (Tonopen, 2:05 PM)      Right Left   Pressure 33 19       Tonometry #2 (Tonopen, 2:07 PM)      Right Left   Pressure 31        Tonometry #3 (Tonopen, 3:38 PM)      Right Left   Pressure 15        Tonometry Comments   T3 post AC tap       Pupils      Dark Light Shape React   Right 7 7 dilated none   Left           Extraocular Movement      Right Left    Full Full       Neuro/Psych    Oriented x3: Yes   Mood/Affect: Normal       Dilation    Right eye: 1.0% Mydriacyl, 2.5% Phenylephrine @ 2:07 PM        Slit Lamp and Fundus Exam    Slit Lamp Exam      Right Left   Lids/Lashes Dermatochalasis - upper lid, mild Meibomian gland dysfunction Dermatochalasis - upper lid, mild Meibomian gland dysfunction   Conjunctiva/Sclera sutures intact -- dissolving, 1+Injection White and quiet    Cornea Arcus, trace Punctate epithelial erosions, focal endo pigment, 1-2+ microcystic edema with bullae, +corneal haze Arcus, trace Punctate epithelial erosions   Anterior Chamber Deep and quiet Deep and quiet   Iris Round and dilated Round and dilated   Lens 2+ Nuclear sclerosis, 2+ Cortical cataract, 1+PC feathering 2+ Nuclear sclerosis, 2+ Cortical cataract   Vitreous post vitrectomy, 90% gas fill Vitreous syneresis, vitreous condensations       Fundus Exam      Right Left   Disc Pink and Sharp    C/D Ratio 0.5 0.5   Macula hazy view, mac hole closing under gas    Vessels mild Vascular attenuation    Periphery Hazy view, attached, good 360 laser in place, No heme           Refraction    Wearing Rx      Sphere Cylinder Axis   Right -6.50 +1.00 107   Left -6.75 +1.25 060   Type: progressive          IMAGING AND PROCEDURES  Imaging and Procedures for _0 @           ASSESSMENT/PLAN:    ICD-10-CM   1. Macular hole of right eye  H35.341   2. Vitreomacular adhesion of right eye  H43.821   3. Retinal edema  H35.81   4. Combined forms of age-related cataract of both eyes  H25.813     1-3. Macular hole with CME and residual vitreous traction OD  - +distortion and decreased vision OD  - pre op BCVA 20/70  - OCT showed macular hole w/ persistent CME and VMT  -  now POW1 s/p PPV/TissueBlue Stain/MP/14% C3F8 OS, 04.01.21             - doing well              - retina attached and good gas bubble in place w/ mac hole closing             - IOP elevated to 33 OD w/ +microcystic edema and bullae  - AC tap performed today, 04.09.21 -- IOP post procedure 15 mmHg OD  - gas bubble at ~90%              PF dec to TID OD                          zymaxid QID OD -- stop when bottle runs out                         Atropine BID OD -- stop now   Brimonidine -- increase to TID OD                         Cosopt -- increase to TID                         PSO ung QID OD               - cont face down positioning 50% of time; avoid laying flat on back              - eye shield when sleeping x1 more week             - post op drop and positioning instructions reviewed   - f/u 3 days, IOP check OD  4. Mixed form age-related cataracts OU  - The symptoms of cataract, surgical options, and treatments and risks were discussed with patient.  - discussed diagnosis and progression  - not yet visually significant  - discussed likelihood of cataract progression OD post PPV  - monitor for now   Ophthalmic Meds Ordered this visit:  Meds ordered this encounter  Medications  . bacitracin-polymyxin b (POLYSPORIN) ophthalmic ointment    Sig: Place 1 application into the right eye at bedtime. apply to right eye as needed and at bedtime    Dispense:  3.5 g    Refill:  4  . dorzolamide-timolol (COSOPT) 22.3-6.8 MG/ML ophthalmic solution    Sig: Place 1 drop into the right eye 3 (three) times daily.    Dispense:  10 mL    Refill:  4  . brimonidine (ALPHAGAN P) 0.1 % SOLN    Sig: Place 1 drop into both eyes 3 (three) times daily.    Dispense:  15 mL    Refill:  3  . prednisoLONE acetate (PRED FORTE) 1 % ophthalmic suspension    Sig: Place 1 drop into the right eye 3 (three) times daily.    Dispense:  15 mL    Refill:  0       Return in about 3 days (around 05/10/2019) for f/u IOP check OD -- overbook okay.  There are no Patient Instructions on file for this visit.   Explained the diagnoses, plan, and follow up with the patient and they expressed understanding.  Patient expressed understanding of the importance of proper follow up care.   This document serves as a record of  services personally performed by Gardiner Sleeper, MD, PhD. It was created on their behalf by Ernest Mallick, OA, an ophthalmic assistant. The creation of this record is the provider's dictation and/or activities during the visit.    Electronically signed by: Ernest Mallick, OA 04.06.2021 3:40 PM  Gardiner Sleeper, M.D., Ph.D. Diseases & Surgery of the Retina and Vitreous [facility]  I have reviewed the above documentation for accuracy and completeness, and I agree with the above. Gardiner Sleeper, M.D., Ph.D. 05/07/19 3:40 PM     Abbreviations: M myopia (nearsighted); A astigmatism; H hyperopia (farsighted); P presbyopia; Mrx spectacle prescription;  CTL contact lenses; OD right eye; OS left eye; OU both eyes  XT exotropia; ET esotropia; PEK punctate epithelial keratitis; PEE punctate epithelial erosions; DES dry eye syndrome; MGD meibomian gland dysfunction; ATs artificial tears; PFAT's preservative free artificial tears; [place] nuclear sclerotic cataract; PSC posterior subcapsular cataract; ERM epi-retinal membrane; PVD posterior vitreous detachment; RD retinal detachment; DM diabetes mellitus; DR diabetic retinopathy; NPDR non-proliferative diabetic retinopathy; PDR proliferative diabetic retinopathy; CSME clinically significant macular edema; DME diabetic macular edema; dbh dot blot hemorrhages; CWS cotton wool spot; POAG primary open angle glaucoma; C/D cup-to-disc ratio; HVF humphrey visual field; GVF goldmann visual field; OCT optical coherence tomography; IOP intraocular pressure; BRVO Branch retinal vein occlusion; CRVO central retinal vein occlusion; CRAO central retinal artery occlusion; BRAO branch retinal artery occlusion; RT retinal tear; SB scleral buckle; PPV pars plana vitrectomy; VH Vitreous hemorrhage; PRP panretinal laser photocoagulation; IVK intravitreal kenalog; VMT vitreomacular traction; MH Macular hole;  NVD neovascularization of the disc; NVE neovascularization elsewhere; AREDS age related eye disease study; ARMD age related macular degeneration; POAG primary open angle glaucoma; EBMD epithelial/anterior basement membrane dystrophy; ACIOL anterior chamber intraocular lens; IOL intraocular lens; PCIOL posterior chamber intraocular lens; Phaco/IOL  phacoemulsification with intraocular lens placement; [place] photorefractive keratectomy; LASIK laser assisted in situ keratomileusis; HTN hypertension; DM diabetes mellitus; COPD chronic obstructive pulmonary disease

## 2019-05-07 ENCOUNTER — Other Ambulatory Visit: Payer: Self-pay

## 2019-05-07 ENCOUNTER — Ambulatory Visit (INDEPENDENT_AMBULATORY_CARE_PROVIDER_SITE_OTHER): Payer: 59 | Admitting: Ophthalmology

## 2019-05-07 ENCOUNTER — Encounter (INDEPENDENT_AMBULATORY_CARE_PROVIDER_SITE_OTHER): Payer: Self-pay | Admitting: Ophthalmology

## 2019-05-07 DIAGNOSIS — H35341 Macular cyst, hole, or pseudohole, right eye: Secondary | ICD-10-CM

## 2019-05-07 DIAGNOSIS — H3581 Retinal edema: Secondary | ICD-10-CM

## 2019-05-07 DIAGNOSIS — H25813 Combined forms of age-related cataract, bilateral: Secondary | ICD-10-CM

## 2019-05-07 DIAGNOSIS — H43821 Vitreomacular adhesion, right eye: Secondary | ICD-10-CM

## 2019-05-07 MED ORDER — BRIMONIDINE TARTRATE 0.1 % OP SOLN: 1.0000 [drp] | Freq: Three times a day (TID) | OPHTHALMIC | 3 refills | Status: DC

## 2019-05-07 MED ORDER — BACITRACIN-POLYMYXIN B 500-10000 UNIT/GM OP OINT: 1.0000 "application " | TOPICAL_OINTMENT | Freq: Every day | OPHTHALMIC | 4 refills | Status: DC

## 2019-05-07 MED ORDER — DORZOLAMIDE HCL-TIMOLOL MAL 2-0.5 % OP SOLN: 1.0000 [drp] | Freq: Three times a day (TID) | OPHTHALMIC | 4 refills | Status: DC

## 2019-05-07 MED ORDER — PREDNISOLONE ACETATE 1 % OP SUSP: 1.0000 [drp] | Freq: Three times a day (TID) | OPHTHALMIC | 0 refills | Status: DC

## 2019-05-10 ENCOUNTER — Encounter (INDEPENDENT_AMBULATORY_CARE_PROVIDER_SITE_OTHER): Payer: Self-pay | Admitting: Ophthalmology

## 2019-05-10 ENCOUNTER — Ambulatory Visit (INDEPENDENT_AMBULATORY_CARE_PROVIDER_SITE_OTHER): Payer: 59 | Admitting: Ophthalmology

## 2019-05-10 DIAGNOSIS — H25813 Combined forms of age-related cataract, bilateral: Secondary | ICD-10-CM

## 2019-05-10 DIAGNOSIS — H43821 Vitreomacular adhesion, right eye: Secondary | ICD-10-CM

## 2019-05-10 DIAGNOSIS — H3581 Retinal edema: Secondary | ICD-10-CM

## 2019-05-10 DIAGNOSIS — H35341 Macular cyst, hole, or pseudohole, right eye: Secondary | ICD-10-CM

## 2019-05-10 NOTE — Progress Notes (Signed)
[facility] Note  05/10/2019     CHIEF COMPLAINT Patient presents for Post-op Follow-up   HISTORY OF PRESENT ILLNESS: Angela [name] is a 64 y.o. female who presents to the clinic today for:   HPI    Post-op Follow-up    In right eye.  Discomfort includes pain and itching.  Vision is stable, is blurred at near and is blurred at distance.  I, the attending physician,  performed the HPI with the patient and updated documentation appropriately.          Comments    64 y/o female pt here for 3 day POV and IOP ck OD.  No change in [place] OU.  Has ocassional dull pain and itching OD.  No pain OS.  Denies FOL, floaters OU.  PF TID OD Zymaxid QID (still has a little bit left) Brimonidine TID OD Cosopt TID OD PSO ung QID OD       Last edited by Bernarda Caffey, MD on 05/10/2019  9:26 PM. (History)    pt states she has been sleeping with her face down and using the drops as directed  Referring physician: Alroy Dust, L.[name], [place] [address] [place],  [place] [id number]  HISTORICAL INFORMATION:   Selected notes from the MEDICAL RECORD NUMBER Referred by Dr. Shirley Muscat for mac hole OD   CURRENT MEDICATIONS: Current Outpatient Medications (Ophthalmic Drugs)  Medication Sig  . atropine 1 % ophthalmic solution Place 1 drop into the right eye in the morning and at bedtime.  . bacitracin-polymyxin b (POLYSPORIN) ophthalmic ointment Place 1 application into the right eye at bedtime. apply to right eye as needed and at bedtime  . brimonidine (ALPHAGAN P) 0.1 % SOLN Place 1 drop into both eyes 3 (three) times daily.  . carboxymethylcellulose (REFRESH TEARS) 0.5 % SOLN Place 1 drop into both eyes 3 (three) times daily as needed (dry eyes).  . dorzolamide-timolol (COSOPT) 22.3-6.8 MG/ML ophthalmic solution Place 1 drop into the right eye 3 (three) times daily.  Marland Kitchen gatifloxacin (ZYMAXID) 0.5 % SOLN Place 1 drop into the right eye 4 (four) times daily.  .  prednisoLONE acetate (PRED FORTE) 1 % ophthalmic suspension Place 1 drop into the right eye 3 (three) times daily.   No current facility-administered medications for this visit. (Ophthalmic Drugs)   Current Outpatient Medications (Other)  Medication Sig  . acetaminophen (TYLENOL) 500 MG tablet Take 1,000 mg by mouth every 6 (six) hours as needed for moderate pain or headache.  . albuterol (PROVENTIL) (2.5 MG/3ML) 0.083% nebulizer solution Take 2.5 mg by nebulization every 6 (six) hours as needed for wheezing or shortness of breath.  . calcium carbonate (TUMS - DOSED IN MG ELEMENTAL CALCIUM) 500 MG chewable tablet Chew 2 tablets by mouth daily as needed for indigestion or heartburn.  . Dextromethorphan-guaiFENesin (TUSSIN DM MAX ADULT) 10-200 MG/5ML LIQD Take 10 mLs by mouth daily as needed (cough).  . diphenhydrAMINE (BENADRYL) 25 MG tablet Take 25 mg by mouth every 6 (six) hours as needed for itching or allergies.  . hydrocortisone cream 1 % Apply 1 application topically 2 (two) times daily as needed (rash).  Marland Kitchen letrozole (FEMARA) 2.5 MG tablet Take 1 tablet (2.5 mg total) by mouth daily.  Marland Kitchen levothyroxine (SYNTHROID, LEVOTHROID) 125 MCG tablet Take 125 mcg by mouth daily before breakfast.  . loratadine (CLARITIN) 10 MG tablet Take 10 mg by mouth daily.  . montelukast (SINGULAIR) 10 MG tablet Take 10  mg by mouth at bedtime.  . triamcinolone (NASACORT AQ) 55 MCG/ACT AERO nasal inhaler Place 1 spray into the nose daily as needed (allergies).    No current facility-administered medications for this visit. (Other)      REVIEW OF SYSTEMS: ROS    Positive for: Gastrointestinal, Eyes, Respiratory   Negative for: Constitutional, Neurological, Skin, Genitourinary, Musculoskeletal, HENT, Endocrine, Cardiovascular, Psychiatric, Allergic/Imm, Heme/Lymph   Last edited by Matthew Folks, COA on 05/10/2019  9:15 AM. (History)       ALLERGIES Allergies  Allergen Reactions  . Other Other (See  Comments)    NO BLOOD PRODUCTS - PT IN AGREEMENT FOR ALBUMIN OR ALBUMIN CONTAINING PRODUCTS PER CONSENT  . Aspirin Other (See Comments)    Stomach ulcers   . Ibuprofen Other (See Comments)    Cautious by MD due to kidney function     PAST MEDICAL HISTORY Past Medical History:  Diagnosis Date  . Anxiety   . Asthma   . Asthma due to seasonal allergies   . Breast cancer ([place]) 07/2017   Left Breast Cancer  . Breast discharge   . Congestion of right ear   . Depression   . GERD (gastroesophageal reflux disease)   . Headache(784.0)   . Hyperlipidemia   . Hypothyroidism   . Seizures ([place])    hx of during childhood -not since age 10  . Sleep apnea    uses CPAP  . Thyroid disease   . Tinnitus   . Urticaria    Past Surgical History:  Procedure Laterality Date  . Cucumber VITRECTOMY WITH 20 GAUGE MVR PORT FOR MACULAR HOLE Right 04/29/2019   Procedure: 25 GAUGE PARS PLANA VITRECTOMY WITH 20 GAUGE MVR PORT FOR MACULAR HOLE;  Surgeon: Bernarda Caffey, MD;  Location: [facility];  Service: Ophthalmology;  Laterality: Right;  . ANTERIOR AND POSTERIOR REPAIR  2007  . BREAST DUCTAL SYSTEM EXCISION  01/18/2011   Procedure: EXCISION DUCTAL SYSTEM BREAST;  Surgeon: Belva Crome, MD;  Location: [facility];  Service: General;  Laterality: Right;  . BREAST EXCISIONAL BIOPSY Right   . BREAST RECONSTRUCTION WITH PLACEMENT OF TISSUE EXPANDER AND ALLODERM Left 01/06/2018   Procedure: BREAST RECONSTRUCTION WITH PLACEMENT OF TISSUE EXPANDER AND ALLODERM;  Surgeon: Irene Limbo, MD;  Location: [facility];  Service: Plastics;  Laterality: Left;  . BREAST SURGERY  01/18/2011   Right BX  . CYSTOSCOPY WITH URETHROLYSIS N/A 11/01/2014   Procedure: CYSTOSCOPY WITH URETHROLYSIS;  Surgeon: Bjorn Loser, MD;  Location: [facility];  Service: Urology;  Laterality: N/A;  . DILATION AND CURETTAGE OF UTERUS    . MASTECTOMY Left 12/2017  . PUBOVAGINAL SLING N/A 11/01/2014    Procedure: REMOVAL VAGINAL SLING AND URETHROLYSIS AND CYSTO;  Surgeon: Bjorn Loser, MD;  Location: [facility];  Service: Urology;  Laterality: N/A;  . REMOVAL OF TISSUE EXPANDER AND PLACEMENT OF IMPLANT Left 07/14/2018   Procedure: REMOVAL OF  LEFT TISSUE EXPANDER AND PLACEMENT OF IMPLANT;  Surgeon: Irene Limbo, MD;  Location: [facility];  Service: Plastics;  Laterality: Left;  . TUBAL LIGATION  1998  . TYMPANOSTOMY TUBE PLACEMENT  2010   right ear   . WISDOM TOOTH EXTRACTION      FAMILY HISTORY Family History  Problem Relation Age of Onset  . Cancer Father        throat  . Cancer Maternal Aunt        breast  . Breast cancer  Maternal Aunt        in 11's  . Allergic rhinitis Neg Hx   . Asthma Neg Hx   . Eczema Neg Hx   . Immunodeficiency Neg Hx   . Urticaria Neg Hx     SOCIAL HISTORY Social History   Tobacco Use  . Smoking status: Never Smoker  . Smokeless tobacco: Never Used  Substance Use Topics  . Alcohol use: No  . Drug use: No         OPHTHALMIC EXAM:  Base Eye Exam    Visual Acuity (Snellen - Linear)      Right Left   Dist cc HM 20/25   Dist ph cc NI NI   Correction: Glasses       Tonometry (Tonopen, 9:18 AM)      Right Left   Pressure 18 Def       Pupils      Dark Light Shape React APD   Right 5 5 Round Minimal None   Left 3 2 Round Brisk None       Visual Fields (Counting fingers)      Left Right    Full    Restrictions  Total superior temporal, inferior temporal, superior nasal, inferior nasal deficiencies       Extraocular Movement      Right Left    Full, Ortho Full, Ortho       Neuro/Psych    Oriented x3: Yes   Mood/Affect: Normal       Dilation    Right eye: 1.0% Mydriacyl, 2.5% Phenylephrine @ 9:18 AM        Slit Lamp and Fundus Exam    Slit Lamp Exam      Right Left   Lids/Lashes Dermatochalasis - upper lid, mild Meibomian gland dysfunction Dermatochalasis - upper lid, mild Meibomian gland  dysfunction   Conjunctiva/Sclera sutures intact -- dissolving, 1+Injection White and quiet   Cornea Arcus, trace Punctate epithelial erosions, focal endo pigment, 1+ microcystic edema, +corneal haze Arcus, trace Punctate epithelial erosions   Anterior Chamber Deep and quiet Deep and quiet   Iris Round and dilated Round and dilated   Lens 2+ Nuclear sclerosis, 2+ Cortical cataract, 1+PC feathering 2+ Nuclear sclerosis, 2+ Cortical cataract   Vitreous post vitrectomy, 85% gas fill Vitreous syneresis, vitreous condensations       Fundus Exam      Right Left   Disc perfused    C/D Ratio 0.5 0.5   Macula Flat, mac hole closing under gas    Vessels mild Vascular attenuation    Periphery Hazy view, attached, good 360 laser in place, No heme             IMAGING AND PROCEDURES  Imaging and Procedures for _0 @           ASSESSMENT/PLAN:    ICD-10-CM   1. Macular hole of right eye  H35.341   2. Vitreomacular adhesion of right eye  H43.821   3. Retinal edema  H35.81   4. Combined forms of age-related cataract of both eyes  H25.813     1-3. Macular hole with CME and residual vitreous traction OD  - +distortion and decreased vision OD  - pre op BCVA 20/70  - OCT showed macular hole w/ persistent CME and VMT  - s/p PPV/TissueBlue Stain/MP/14% C3F8 OS, 04.01.21             - doing well              -  retina attached and good gas bubble in place w/ mac hole closing             - IOP down to 18 from 33 OD w/ +microcystic edema and bullae improving  - gas bubble at ~85%             - cont PF TID OD                     zymaxid QID OD -- stop when bottle runs out   Brimonidine TID OD                         Cosopt TID OD                         PSO ung QID OD              - cont face down positioning 50% of time; avoid laying flat on back              - eye shield when sleeping x1 more week             - post op drop and positioning instructions reviewed   - f/u 1 week for POV  and IOP check  4. Mixed form age-related cataracts OU  - The symptoms of cataract, surgical options, and treatments and risks were discussed with patient.  - discussed diagnosis and progression  - not yet visually significant  - discussed likelihood of cataract progression OD post PPV  - monitor for now   Ophthalmic Meds Ordered this visit:  No orders of the defined types were placed in this encounter.      Return in about 1 week (around 05/17/2019) for f/u [place] OD, DFE, OCT.  There are no Patient Instructions on file for this visit.   Explained the diagnoses, plan, and follow up with the patient and they expressed understanding.  Patient expressed understanding of the importance of proper follow up care.   This document serves as a record of services personally performed by Gardiner Sleeper, MD, PhD. It was created on their behalf by Ernest Mallick, OA, an ophthalmic assistant. The creation of this record is the provider's dictation and/or activities during the visit.    Electronically signed by: Ernest Mallick, OA 04.12.2021 9:26 PM  Gardiner Sleeper, M.D., Ph.D. Diseases & Surgery of the Retina and Vitreous [facility]  I have reviewed the above documentation for accuracy and completeness, and I agree with the above. Gardiner Sleeper, M.D., Ph.D. 05/10/19 9:26 PM   Abbreviations: M myopia (nearsighted); A astigmatism; H hyperopia (farsighted); P presbyopia; Mrx spectacle prescription;  CTL contact lenses; OD right eye; OS left eye; OU both eyes  XT exotropia; ET esotropia; PEK punctate epithelial keratitis; PEE punctate epithelial erosions; DES dry eye syndrome; MGD meibomian gland dysfunction; ATs artificial tears; PFAT's preservative free artificial tears; [place] nuclear sclerotic cataract; PSC posterior subcapsular cataract; ERM epi-retinal membrane; PVD posterior vitreous detachment; RD retinal detachment; DM diabetes mellitus; DR diabetic retinopathy; NPDR  non-proliferative diabetic retinopathy; PDR proliferative diabetic retinopathy; CSME clinically significant macular edema; DME diabetic macular edema; dbh dot blot hemorrhages; CWS cotton wool spot; POAG primary open angle glaucoma; C/D cup-to-disc ratio; HVF humphrey visual field; GVF goldmann visual field; OCT optical coherence tomography; IOP intraocular pressure; BRVO Branch retinal vein occlusion; CRVO central retinal vein occlusion; CRAO central  retinal artery occlusion; BRAO branch retinal artery occlusion; RT retinal tear; SB scleral buckle; PPV pars plana vitrectomy; VH Vitreous hemorrhage; PRP panretinal laser photocoagulation; IVK intravitreal kenalog; VMT vitreomacular traction; MH Macular hole;  NVD neovascularization of the disc; NVE neovascularization elsewhere; AREDS age related eye disease study; ARMD age related macular degeneration; POAG primary open angle glaucoma; EBMD epithelial/anterior basement membrane dystrophy; ACIOL anterior chamber intraocular lens; IOL intraocular lens; PCIOL posterior chamber intraocular lens; Phaco/IOL phacoemulsification with intraocular lens placement; [place] photorefractive keratectomy; LASIK laser assisted in situ keratomileusis; HTN hypertension; DM diabetes mellitus; COPD chronic obstructive pulmonary disease

## 2019-05-17 NOTE — Progress Notes (Addendum)
[facility] Note  05/19/2019     CHIEF COMPLAINT Patient presents for Post-op Follow-up   HISTORY OF PRESENT ILLNESS: Angela [name] is a 64 y.o. female who presents to the clinic today for:   HPI    Post-op Follow-up    In right eye.  Discomfort includes pain.  Vision is improved, is blurred at distance and is blurred at near.  I, the attending physician,  performed the HPI with the patient and updated documentation appropriately.          Comments    64 y/o female pt here for 9 day POV.  S/p PPV for mac hole repair OD 4.1.21.  Feels she can see more peripherally OD.  No change in [place] OS.  Denies eye pain, FOL, floaters, but has occasional minor pain around her RUL, that eases with use of tylenol.  PF TID OD Zymaxid QID OD Brimondine TID OD Cosopt TID OD PSO ung QID OD       Last edited by Bernarda Caffey, MD on 05/19/2019 11:16 AM. (History)    pt states her vision is much improved today  Referring physician: Alroy Dust, L.[name], [place] [address] [place],  [place] [address]  HISTORICAL INFORMATION:   Selected notes from the MEDICAL RECORD NUMBER Referred by Dr. Shirley Muscat for mac hole OD   CURRENT MEDICATIONS: Current Outpatient Medications (Ophthalmic Drugs)  Medication Sig  . atropine 1 % ophthalmic solution Place 1 drop into the right eye in the morning and at bedtime.  . bacitracin-polymyxin b (POLYSPORIN) ophthalmic ointment Place 1 application into the right eye at bedtime. apply to right eye as needed and at bedtime  . brimonidine (ALPHAGAN P) 0.1 % SOLN Place 1 drop into both eyes 3 (three) times daily.  . carboxymethylcellulose (REFRESH TEARS) 0.5 % SOLN Place 1 drop into both eyes 3 (three) times daily as needed (dry eyes).  . dorzolamide-timolol (COSOPT) 22.3-6.8 MG/ML ophthalmic solution Place 1 drop into the right eye 3 (three) times daily.  Marland Kitchen gatifloxacin (ZYMAXID) 0.5 % SOLN Place 1 drop into the right eye 4 (four)  times daily.  . prednisoLONE acetate (PRED FORTE) 1 % ophthalmic suspension Place 1 drop into the right eye 3 (three) times daily.   No current facility-administered medications for this visit. (Ophthalmic Drugs)   Current Outpatient Medications (Other)  Medication Sig  . acetaminophen (TYLENOL) 500 MG tablet Take 1,000 mg by mouth every 6 (six) hours as needed for moderate pain or headache.  . albuterol (PROVENTIL) (2.5 MG/3ML) 0.083% nebulizer solution Take 2.5 mg by nebulization every 6 (six) hours as needed for wheezing or shortness of breath.  . calcium carbonate (TUMS - DOSED IN MG ELEMENTAL CALCIUM) 500 MG chewable tablet Chew 2 tablets by mouth daily as needed for indigestion or heartburn.  . Dextromethorphan-guaiFENesin (TUSSIN DM MAX ADULT) 10-200 MG/5ML LIQD Take 10 mLs by mouth daily as needed (cough).  . diphenhydrAMINE (BENADRYL) 25 MG tablet Take 25 mg by mouth every 6 (six) hours as needed for itching or allergies.  . hydrocortisone cream 1 % Apply 1 application topically 2 (two) times daily as needed (rash).  Marland Kitchen letrozole (FEMARA) 2.5 MG tablet Take 1 tablet (2.5 mg total) by mouth daily.  Marland Kitchen levothyroxine (SYNTHROID, LEVOTHROID) 125 MCG tablet Take 125 mcg by mouth daily before breakfast.  . loratadine (CLARITIN) 10 MG tablet Take 10 mg by mouth daily.  . montelukast (SINGULAIR) 10 MG tablet Take 10  mg by mouth at bedtime.  . triamcinolone (NASACORT AQ) 55 MCG/ACT AERO nasal inhaler Place 1 spray into the nose daily as needed (allergies).    No current facility-administered medications for this visit. (Other)      REVIEW OF SYSTEMS: ROS    Positive for: Gastrointestinal, Eyes, Respiratory   Negative for: Constitutional, Neurological, Skin, Genitourinary, Musculoskeletal, HENT, Endocrine, Cardiovascular, Psychiatric, Allergic/Imm, Heme/Lymph   Last edited by Matthew Folks, COA on 05/19/2019 10:15 AM. (History)       ALLERGIES Allergies  Allergen Reactions  . Other  Other (See Comments)    NO BLOOD PRODUCTS - PT IN AGREEMENT FOR ALBUMIN OR ALBUMIN CONTAINING PRODUCTS PER CONSENT  . Aspirin Other (See Comments)    Stomach ulcers   . Ibuprofen Other (See Comments)    Cautious by MD due to kidney function     PAST MEDICAL HISTORY Past Medical History:  Diagnosis Date  . Anxiety   . Asthma   . Asthma due to seasonal allergies   . Breast cancer ([facility]) 07/2017   Left Breast Cancer  . Breast discharge   . Cataract    Mixed form OU  . Congestion of right ear   . Depression   . GERD (gastroesophageal reflux disease)   . Headache(784.0)   . Hyperlipidemia   . Hypothyroidism   . Seizures ([facility])    hx of during childhood -not since age 59  . Sleep apnea    uses CPAP  . Thyroid disease   . Tinnitus   . Urticaria    Past Surgical History:  Procedure Laterality Date  . [place] VITRECTOMY WITH 20 GAUGE MVR PORT FOR MACULAR HOLE Right 04/29/2019   Procedure: 25 GAUGE PARS PLANA VITRECTOMY WITH 20 GAUGE MVR PORT FOR MACULAR HOLE;  Surgeon: Bernarda Caffey, MD;  Location: [facility];  Service: Ophthalmology;  Laterality: Right;  . ANTERIOR AND POSTERIOR REPAIR  2007  . BREAST DUCTAL SYSTEM EXCISION  01/18/2011   Procedure: EXCISION DUCTAL SYSTEM BREAST;  Surgeon: Belva Crome, MD;  Location: [facility];  Service: General;  Laterality: Right;  . BREAST EXCISIONAL BIOPSY Right   . BREAST RECONSTRUCTION WITH PLACEMENT OF TISSUE EXPANDER AND ALLODERM Left 01/06/2018   Procedure: BREAST RECONSTRUCTION WITH PLACEMENT OF TISSUE EXPANDER AND ALLODERM;  Surgeon: Irene Limbo, MD;  Location: [facility];  Service: Plastics;  Laterality: Left;  . BREAST SURGERY  01/18/2011   Right BX  . CYSTOSCOPY WITH URETHROLYSIS N/A 11/01/2014   Procedure: CYSTOSCOPY WITH URETHROLYSIS;  Surgeon: Bjorn Loser, MD;  Location: [facility];  Service: Urology;  Laterality: N/A;  . DILATION AND CURETTAGE OF UTERUS    . MASTECTOMY Left 12/2017   . PUBOVAGINAL SLING N/A 11/01/2014   Procedure: REMOVAL VAGINAL SLING AND URETHROLYSIS AND CYSTO;  Surgeon: Bjorn Loser, MD;  Location: [facility];  Service: Urology;  Laterality: N/A;  . REMOVAL OF TISSUE EXPANDER AND PLACEMENT OF IMPLANT Left 07/14/2018   Procedure: REMOVAL OF  LEFT TISSUE EXPANDER AND PLACEMENT OF IMPLANT;  Surgeon: Irene Limbo, MD;  Location: [facility];  Service: Plastics;  Laterality: Left;  . TUBAL LIGATION  1998  . TYMPANOSTOMY TUBE PLACEMENT  2010   right ear   . WISDOM TOOTH EXTRACTION      FAMILY HISTORY Family History  Problem Relation Age of Onset  . Cancer Father        throat  . Cancer Maternal Aunt  breast  . Breast cancer Maternal Aunt        in 71's  . Allergic rhinitis Neg Hx   . Asthma Neg Hx   . Eczema Neg Hx   . Immunodeficiency Neg Hx   . Urticaria Neg Hx     SOCIAL HISTORY Social History   Tobacco Use  . Smoking status: Never Smoker  . Smokeless tobacco: Never Used  Substance Use Topics  . Alcohol use: No  . Drug use: No         OPHTHALMIC EXAM:  Base Eye Exam    Visual Acuity (Snellen - Linear)      Right Left   Dist cc 20/100 + 20/30 +   Dist ph cc NI NI   Correction: Glasses       Tonometry (Tonopen, 10:19 AM)      Right Left   Pressure 19 Def       Pupils      Dark Light Shape React APD   Right 4 3 Round Minimal None   Left 3 2 Round Brisk None       Visual Fields (Counting fingers)      Left Right    Full Full       Extraocular Movement      Right Left    Full, Ortho Full, Ortho       Neuro/Psych    Oriented x3: Yes   Mood/Affect: Normal       Dilation    Right eye: 1.0% Mydriacyl, 2.5% Phenylephrine @ 10:19 AM  Def OS        Slit Lamp and Fundus Exam    Slit Lamp Exam      Right Left   Lids/Lashes Dermatochalasis - upper lid, mild Meibomian gland dysfunction Dermatochalasis - upper lid, mild Meibomian gland dysfunction   Conjunctiva/Sclera Focal Injection  around dissolving sutures White and quiet   Cornea Arcus, trace Punctate epithelial erosions Arcus, trace Punctate epithelial erosions   Anterior Chamber Deep, 1-2+ cell/pigment Deep and quiet   Iris Round and dilated Round and dilated   Lens 2-3+ Nuclear sclerosis, 2+ Cortical cataract, mild pigment on anterior capsule 2+ Nuclear sclerosis, 2+ Cortical cataract   Vitreous post vitrectomy, 50% gas fill Vitreous syneresis, vitreous condensations       Fundus Exam      Right Left   Disc mild Pallor, Sharp rim    C/D Ratio 0.5 0.5   Macula Flat, mac hole closed, Blunted foveal reflex, No heme or edema    Vessels mild Vascular attenuation, mild Tortuous    Periphery Hazy view improved, attached, good 360 peripheral laser changes in place, No heme             IMAGING AND PROCEDURES  Imaging and Procedures for _0 @  OCT, Retina - OU - Both Eyes       Right Eye Quality was good. Central Foveal Thickness: 322. Progression has improved. Findings include abnormal foveal contour, vitreomacular adhesion , no IRF, no SRF (Mac hole closed, superior macula obscured by gas bubble).   Left Eye Quality was good. Central Foveal Thickness: 256. Progression has been stable. Findings include normal foveal contour, no IRF, no SRF (Partial PVD).   Notes *Images captured and stored on drive  Diagnosis / Impression:  OD: Mac hole closed, superior macula obscured by gas bubble OS: NFP, no IRF/SRF; partial PVD  Clinical management:  See below  Abbreviations: NFP - Normal foveal profile. CME - cystoid macular  edema. PED - pigment epithelial detachment. IRF - intraretinal fluid. SRF - subretinal fluid. EZ - ellipsoid zone. ERM - epiretinal membrane. ORA - outer retinal atrophy. ORT - outer retinal tubulation. SRHM - subretinal hyper-reflective material                 ASSESSMENT/PLAN:    ICD-10-CM   1. Macular hole of right eye  H35.341   2. Vitreomacular adhesion of right eye  H43.821    3. Retinal edema  H35.81 OCT, Retina - OU - Both Eyes  4. Combined forms of age-related cataract of both eyes  H25.813     1-3. Macular hole with CME and residual vitreous traction OD  - +distortion and decreased vision OD  - pre op BCVA 20/70  - OCT showed macular hole w/ persistent CME and VMT  - s/p PPV/TissueBlue Stain/MP/14% C3F8 OS, 04.01.21             - doing well              - mac hole closed and retina attached w/ good peripheral laser             - IOP 18 microcystic edema and bullae stably resolved  - gas bubble at ~50%             - cont PF TID OD    Brimonidine TID OD                         Cosopt TID OD                         PSO ung QID OD -- okay to stop             - cont face down positioning 50% of time; avoid laying flat on back              - post op drop and positioning instructions reviewed   - f/u 5 weeks for POV   4. Mixed form age-related cataracts OU  - The symptoms of cataract, surgical options, and treatments and risks were discussed with patient.  - discussed diagnosis and progression  - not yet visually significant  - discussed likelihood of cataract progression OD post PPV  - monitor for now   Ophthalmic Meds Ordered this visit:  No orders of the defined types were placed in this encounter.     Return in about 5 weeks (around 06/23/2019) for POV mac hole OD, DFE, OCT.  There are no Patient Instructions on file for this visit.    This document serves as a record of services personally performed by Gardiner Sleeper, MD, PhD. It was created on their behalf by Ernest Mallick, OA, an ophthalmic assistant. The creation of this record is the provider's dictation and/or activities during the visit.    Electronically signed by: Ernest Mallick, OA 04.21.2021 11:10 AM  Gardiner Sleeper, M.D., Ph.D. Diseases & Surgery of the Retina and [place] 05/19/2019    I have reviewed the above documentation for accuracy and  completeness, and I agree with the above. Gardiner Sleeper, M.D., Ph.D. 05/19/19 11:30 AM     Abbreviations: M myopia (nearsighted); A astigmatism; H hyperopia (farsighted); P presbyopia; Mrx spectacle prescription;  CTL contact lenses; OD right eye; OS left eye; OU both eyes  XT exotropia; ET esotropia; PEK punctate epithelial keratitis; PEE  punctate epithelial erosions; DES dry eye syndrome; MGD meibomian gland dysfunction; ATs artificial tears; PFAT's preservative free artificial tears; [place] nuclear sclerotic cataract; PSC posterior subcapsular cataract; ERM epi-retinal membrane; PVD posterior vitreous detachment; RD retinal detachment; DM diabetes mellitus; DR diabetic retinopathy; NPDR non-proliferative diabetic retinopathy; PDR proliferative diabetic retinopathy; CSME clinically significant macular edema; DME diabetic macular edema; dbh dot blot hemorrhages; CWS cotton wool spot; POAG primary open angle glaucoma; C/D cup-to-disc ratio; HVF humphrey visual field; GVF goldmann visual field; OCT optical coherence tomography; IOP intraocular pressure; BRVO Branch retinal vein occlusion; CRVO central retinal vein occlusion; CRAO central retinal artery occlusion; BRAO branch retinal artery occlusion; RT retinal tear; SB scleral buckle; PPV pars plana vitrectomy; VH Vitreous hemorrhage; PRP panretinal laser photocoagulation; IVK intravitreal kenalog; VMT vitreomacular traction; MH Macular hole;  NVD neovascularization of the disc; NVE neovascularization elsewhere; AREDS age related eye disease study; ARMD age related macular degeneration; POAG primary open angle glaucoma; EBMD epithelial/anterior basement membrane dystrophy; ACIOL anterior chamber intraocular lens; IOL intraocular lens; PCIOL posterior chamber intraocular lens; Phaco/IOL phacoemulsification with intraocular lens placement; [place] photorefractive keratectomy; LASIK laser assisted in situ keratomileusis; HTN hypertension; DM diabetes mellitus; COPD  chronic obstructive pulmonary disease

## 2019-05-18 ENCOUNTER — Other Ambulatory Visit: Payer: 59

## 2019-05-19 ENCOUNTER — Encounter (INDEPENDENT_AMBULATORY_CARE_PROVIDER_SITE_OTHER): Payer: Self-pay | Admitting: Ophthalmology

## 2019-05-19 ENCOUNTER — Ambulatory Visit (INDEPENDENT_AMBULATORY_CARE_PROVIDER_SITE_OTHER): Payer: 59 | Admitting: Ophthalmology

## 2019-05-19 DIAGNOSIS — H35341 Macular cyst, hole, or pseudohole, right eye: Secondary | ICD-10-CM

## 2019-05-19 DIAGNOSIS — H3581 Retinal edema: Secondary | ICD-10-CM | POA: Diagnosis not present

## 2019-05-19 DIAGNOSIS — H25813 Combined forms of age-related cataract, bilateral: Secondary | ICD-10-CM

## 2019-05-19 DIAGNOSIS — H43821 Vitreomacular adhesion, right eye: Secondary | ICD-10-CM

## 2019-05-21 ENCOUNTER — Emergency Department (HOSPITAL_COMMUNITY): Payer: 59

## 2019-05-21 ENCOUNTER — Other Ambulatory Visit: Payer: Self-pay

## 2019-05-21 ENCOUNTER — Emergency Department (HOSPITAL_COMMUNITY)
Admission: EM | Admit: 2019-05-21 | Discharge: 2019-05-21 | Disposition: A | Discharge status: Home / Self Care | Payer: 59 | Attending: Emergency Medicine | Admitting: Emergency Medicine

## 2019-05-21 DIAGNOSIS — Z79899 Other long term (current) drug therapy: Secondary | ICD-10-CM | POA: Insufficient documentation

## 2019-05-21 DIAGNOSIS — J45901 Unspecified asthma with (acute) exacerbation: Secondary | ICD-10-CM | POA: Diagnosis not present

## 2019-05-21 DIAGNOSIS — R062 Wheezing: Secondary | ICD-10-CM | POA: Diagnosis present

## 2019-05-21 DIAGNOSIS — Z20822 Contact with and (suspected) exposure to covid-19: Secondary | ICD-10-CM | POA: Insufficient documentation

## 2019-05-21 DIAGNOSIS — E039 Hypothyroidism, unspecified: Secondary | ICD-10-CM | POA: Insufficient documentation

## 2019-05-21 LAB — POCT I-STAT EG7
Acid-base deficit: 4 mmol/L — ABNORMAL HIGH (ref 0.0–2.0)
Bicarbonate: 21.9 mmol/L (ref 20.0–28.0)
Calcium, Ion: 0.98 mmol/L — ABNORMAL LOW (ref 1.15–1.40)
HCT: 34 % — ABNORMAL LOW (ref 36.0–46.0)
Hemoglobin: 11.6 g/dL — ABNORMAL LOW (ref 12.0–15.0)
O2 Saturation: 94 %
Potassium: 2.9 mmol/L — ABNORMAL LOW (ref 3.5–5.1)
Sodium: 147 mmol/L — ABNORMAL HIGH (ref 135–145)
TCO2: 23 mmol/L (ref 22–32)
pCO2, Ven: 43.1 mmHg — ABNORMAL LOW (ref 44.0–60.0)
pH, Ven: 7.315 (ref 7.250–7.430)
pO2, Ven: 79 mmHg — ABNORMAL HIGH (ref 32.0–45.0)

## 2019-05-21 LAB — BASIC METABOLIC PANEL
Anion gap: 6 (ref 5–15)
BUN: 13 mg/dL (ref 8–23)
CO2: 23 mmol/L (ref 22–32)
Calcium: 7.4 mg/dL — ABNORMAL LOW (ref 8.9–10.3)
Chloride: 113 mmol/L — ABNORMAL HIGH (ref 98–111)
Creatinine, Ser: 0.78 mg/dL (ref 0.44–1.00)
GFR calc Af Amer: >60 mL/min (ref >60)
GFR calc non Af Amer: >60 mL/min (ref >60)
Glucose, Bld: 89 mg/dL (ref 70–99)
Potassium: 3 mmol/L — ABNORMAL LOW (ref 3.5–5.1)
Sodium: 142 mmol/L (ref 135–145)

## 2019-05-21 LAB — CBC
HCT: 37.4 % (ref 36.0–46.0)
Hemoglobin: 11.6 g/dL — ABNORMAL LOW (ref 12.0–15.0)
MCH: 23.7 pg — ABNORMAL LOW (ref 26.0–34.0)
MCHC: 31 g/dL (ref 30.0–36.0)
MCV: 76.3 fL — ABNORMAL LOW (ref 80.0–100.0)
Platelets: 263 10*3/uL (ref 150–400)
RBC: 4.9 MIL/uL (ref 3.87–5.11)
RDW: 16.5 % — ABNORMAL HIGH (ref 11.5–15.5)
WBC: 6.4 10*3/uL (ref 4.0–10.5)
nRBC: 0 % (ref 0.0–0.2)

## 2019-05-21 LAB — POC SARS CORONAVIRUS 2 AG -  ED: SARS Coronavirus 2 Ag: NEGATIVE

## 2019-05-21 MED ORDER — ALBUTEROL SULFATE HFA 108 (90 BASE) MCG/ACT IN AERS: 2.0000 | INHALATION_SPRAY | RESPIRATORY_TRACT | 1 refills | Status: DC | PRN

## 2019-05-21 MED ORDER — METHYLPREDNISOLONE SODIUM SUCC 125 MG IJ SOLR
125.0000 mg | Freq: Once | INTRAMUSCULAR | Status: AC
  Administered 2019-05-21: 10:00:00 125 mg via INTRAVENOUS
  Filled 2019-05-21: qty 2

## 2019-05-21 MED ORDER — ALBUTEROL (5 MG/ML) CONTINUOUS INHALATION SOLN: 15.0000 mg | INHALATION_SOLUTION | RESPIRATORY_TRACT | Status: DC

## 2019-05-21 MED ORDER — PREDNISONE 20 MG PO TABS
40.0000 mg | ORAL_TABLET | Freq: Every day | ORAL | 0 refills | Status: DC
Start: 2019-05-21 — End: 2019-06-23

## 2019-05-21 MED ORDER — ALBUTEROL SULFATE (2.5 MG/3ML) 0.083% IN NEBU
2.5000 mg | INHALATION_SOLUTION | Freq: Four times a day (QID) | RESPIRATORY_TRACT | 0 refills | Status: DC | PRN
Start: 2019-05-21 — End: 2021-04-03

## 2019-05-21 MED ORDER — IPRATROPIUM-ALBUTEROL 0.5-2.5 (3) MG/3ML IN SOLN
3.0000 mL | Freq: Once | RESPIRATORY_TRACT | Status: AC
  Administered 2019-05-21: 10:00:00 3 mL via RESPIRATORY_TRACT
  Filled 2019-05-21: qty 3

## 2019-05-21 MED ORDER — ALBUTEROL SULFATE (2.5 MG/3ML) 0.083% IN NEBU
5.0000 mg | INHALATION_SOLUTION | Freq: Once | RESPIRATORY_TRACT | Status: AC
  Administered 2019-05-21: 13:00:00 5 mg via RESPIRATORY_TRACT
  Filled 2019-05-21: qty 6

## 2019-05-21 MED ORDER — MAGNESIUM SULFATE 2 GM/50ML IV SOLN
2.0000 g | Freq: Once | INTRAVENOUS | Status: AC
  Administered 2019-05-21: 2 g via INTRAVENOUS
  Filled 2019-05-21: qty 50

## 2019-05-21 MED ORDER — IPRATROPIUM-ALBUTEROL 0.5-2.5 (3) MG/3ML IN SOLN
3.0000 mL | Freq: Once | RESPIRATORY_TRACT | Status: AC
  Administered 2019-05-21: 12:00:00 3 mL via RESPIRATORY_TRACT
  Filled 2019-05-21: qty 3

## 2019-05-21 NOTE — ED Provider Notes (Signed)
[facility] EMERGENCY DEPARTMENT Provider Note   CSN: [id number] Arrival date & time: 05/21/19  1950     History Chief Complaint  Patient presents with  . Asthma    Angela [name] is a 64 y.o. female.  64 year old female with past medical history including asthma, HLD, hypothyroidism, OSA who p/w wheezing.  Patient had outpatient eye surgery this week; she states she was intubated for procedure.  After she got home, she noticed some runny nose.  Over the past several days, she has had progressively worsening wheezing with some coughing.  She denies any fever, vomiting, diarrhea, loss of taste/smell, or sick contacts.  She reports associated chest tightness like a band around her chest.  This feels like previous asthma exacerbations but she has not had any problems with her asthma in at least 2 years.  She takes daily Singulair and loratadine.  No leg swelling or pain.  No history of blood clots.  The history is provided by the patient.  Asthma       Past Medical History:  Diagnosis Date  . Anxiety   . Asthma   . Asthma due to seasonal allergies   . Breast cancer ([place]) 07/2017   Left Breast Cancer  . Breast discharge   . Cataract    Mixed form OU  . Congestion of right ear   . Depression   . GERD (gastroesophageal reflux disease)   . Headache(784.0)   . Hyperlipidemia   . Hypothyroidism   . Seizures ([place])    hx of during childhood -not since age 54  . Sleep apnea    uses CPAP  . Thyroid disease   . Tinnitus   . Urticaria     Patient Active Problem List   Diagnosis Date Noted  . Breast cancer, left ([place]) 01/06/2018  . Malignant neoplasm of lower-inner quadrant of left breast in female, estrogen receptor positive ([place]) 07/22/2017  . Elevated BP without diagnosis of hypertension   . Gastroesophageal reflux disease   . Asthma exacerbation 05/13/2016  . Anxiety 05/13/2016  . Depression 05/13/2016  . Erosion of suburethral sling ([place]) 11/01/2014    . Goiter, nodular 09/17/2012  . Hypothyroidism 09/04/2012  . Postop check 02/08/2011  . Nipple discharge 12/18/2010  . Right breast nipple inversion and discharge 11/06/2010    Past Surgical History:  Procedure Laterality Date  . [place] VITRECTOMY WITH 20 GAUGE MVR PORT FOR MACULAR HOLE Right 04/29/2019   Procedure: 25 GAUGE PARS PLANA VITRECTOMY WITH 20 GAUGE MVR PORT FOR MACULAR HOLE;  Surgeon: Bernarda Caffey, MD;  Location: [facility];  Service: Ophthalmology;  Laterality: Right;  . ANTERIOR AND POSTERIOR REPAIR  2007  . BREAST DUCTAL SYSTEM EXCISION  01/18/2011   Procedure: EXCISION DUCTAL SYSTEM BREAST;  Surgeon: Belva Crome, MD;  Location: [facility];  Service: General;  Laterality: Right;  . BREAST EXCISIONAL BIOPSY Right   . BREAST RECONSTRUCTION WITH PLACEMENT OF TISSUE EXPANDER AND ALLODERM Left 01/06/2018   Procedure: BREAST RECONSTRUCTION WITH PLACEMENT OF TISSUE EXPANDER AND ALLODERM;  Surgeon: Irene Limbo, MD;  Location: [place];  Service: Plastics;  Laterality: Left;  . BREAST SURGERY  01/18/2011   Right BX  . CYSTOSCOPY WITH URETHROLYSIS N/A 11/01/2014   Procedure: CYSTOSCOPY WITH URETHROLYSIS;  Surgeon: Bjorn Loser, MD;  Location: [facility];  Service: Urology;  Laterality: N/A;  . DILATION AND CURETTAGE OF UTERUS    . MASTECTOMY Left 12/2017  .  PUBOVAGINAL SLING N/A 11/01/2014   Procedure: REMOVAL VAGINAL SLING AND URETHROLYSIS AND CYSTO;  Surgeon: Bjorn Loser, MD;  Location: [facility];  Service: Urology;  Laterality: N/A;  . REMOVAL OF TISSUE EXPANDER AND PLACEMENT OF IMPLANT Left 07/14/2018   Procedure: REMOVAL OF  LEFT TISSUE EXPANDER AND PLACEMENT OF IMPLANT;  Surgeon: Irene Limbo, MD;  Location: [place];  Service: Plastics;  Laterality: Left;  . TUBAL LIGATION  1998  . TYMPANOSTOMY TUBE PLACEMENT  2010   right ear   . WISDOM TOOTH EXTRACTION       OB History    Gravida  6   Para  4    Term  4   Preterm      AB  2   Living  4     SAB  2   TAB      Ectopic      Multiple      Live Births              Family History  Problem Relation Age of Onset  . Cancer Father        throat  . Cancer Maternal Aunt        breast  . Breast cancer Maternal Aunt        in 71's  . Allergic rhinitis Neg Hx   . Asthma Neg Hx   . Eczema Neg Hx   . Immunodeficiency Neg Hx   . Urticaria Neg Hx     Social History   Tobacco Use  . Smoking status: Never Smoker  . Smokeless tobacco: Never Used  Substance Use Topics  . Alcohol use: No  . Drug use: No    Home Medications Prior to Admission medications   Medication Sig Start Date End Date Taking? Authorizing Provider  acetaminophen (TYLENOL) 500 MG tablet Take 1,000 mg by mouth every 6 (six) hours as needed for moderate pain or headache.    [provider]  albuterol (PROVENTIL) (2.5 MG/3ML) 0.083% nebulizer solution Take 2.5 mg by nebulization every 6 (six) hours as needed for wheezing or shortness of breath.    [provider]  atropine 1 % ophthalmic solution Place 1 drop into the right eye in the morning and at bedtime.    [provider]  bacitracin-polymyxin b (POLYSPORIN) ophthalmic ointment Place 1 application into the right eye at bedtime. apply to right eye as needed and at bedtime 05/07/19   Bernarda Caffey, MD  brimonidine (ALPHAGAN P) 0.1 % SOLN Place 1 drop into both eyes 3 (three) times daily. 05/07/19   Bernarda Caffey, MD  calcium carbonate (TUMS - DOSED IN MG ELEMENTAL CALCIUM) 500 MG chewable tablet Chew 2 tablets by mouth daily as needed for indigestion or heartburn.    [provider]  carboxymethylcellulose (REFRESH TEARS) 0.5 % SOLN Place 1 drop into both eyes 3 (three) times daily as needed (dry eyes).    [provider]  Dextromethorphan-guaiFENesin (TUSSIN DM MAX ADULT) 10-200 MG/5ML LIQD Take 10 mLs by mouth daily as needed (cough).    [provider]  diphenhydrAMINE (BENADRYL) 25 MG tablet Take 25 mg by mouth every 6 (six) hours as needed for itching or allergies.    [provider]  dorzolamide-timolol (COSOPT) 22.3-6.8 MG/ML ophthalmic solution Place 1 drop into the right eye 3 (three) times daily. 05/07/19   Bernarda Caffey, MD  gatifloxacin (ZYMAXID) 0.5 % SOLN Place 1 drop into the right eye 4 (four) times daily.  [provider]  hydrocortisone cream 1 % Apply 1 application topically 2 (two) times daily as needed (rash).    [provider]  letrozole (FEMARA) 2.5 MG tablet Take 1 tablet (2.5 mg total) by mouth daily. 03/08/19   Nicholas Lose, MD  levothyroxine (SYNTHROID, LEVOTHROID) 125 MCG tablet Take 125 mcg by mouth daily before breakfast. 11/21/17   [provider]  loratadine (CLARITIN) 10 MG tablet Take 10 mg by mouth daily.    [provider]  montelukast (SINGULAIR) 10 MG tablet Take 10 mg by mouth at bedtime.    [provider]  prednisoLONE acetate (PRED FORTE) 1 % ophthalmic suspension Place 1 drop into the right eye 3 (three) times daily. 05/07/19   Bernarda Caffey, MD  triamcinolone (NASACORT AQ) 55 MCG/ACT AERO nasal inhaler Place 1 spray into the nose daily as needed (allergies).     [provider]    Allergies    Other, Aspirin, and Ibuprofen  Review of Systems   Review of Systems All other systems reviewed and are negative except that which was mentioned in HPI  Physical Exam Updated Vital Signs BP (!) 161/129 (BP Location: Right Arm)   Pulse 97   Temp 98.4 F (36.9 C) (Oral)   Resp 20   Ht _0  (1.651 m)   Wt 107.5 kg   SpO2 93%   BMI 39.44 kg/m   Physical Exam Vitals and nursing note reviewed.  Constitutional:      Appearance: She is well-developed.     Comments: Mild respiratory distress but calm and interactive  HENT:     Head: Normocephalic and atraumatic.  Eyes:     Comments: Patch over right eye  Cardiovascular:     Rate and  Rhythm: Normal rate and regular rhythm.     Heart sounds: Normal heart sounds. No murmur.  Pulmonary:     Effort: Respiratory distress present.     Breath sounds: Wheezing present.     Comments: Audible wheezing from doorway, diffuse inspiratory and expiratory wheezes bilaterally, some accessory muscle use; able to speak in full sentences Abdominal:     General: Bowel sounds are normal. There is no distension.     Palpations: Abdomen is soft.     Tenderness: There is no abdominal tenderness.  Musculoskeletal:     Cervical back: Neck supple.     Right lower leg: No edema.     Left lower leg: No edema.  Skin:    General: Skin is warm and dry.  Neurological:     Mental Status: She is alert and oriented to person, place, and time.     Comments: Fluent speech  Psychiatric:        Judgment: Judgment normal.     ED Results / Procedures / Treatments   Labs (all labs ordered are listed, but only abnormal results are displayed) Labs Reviewed - No data to display  EKG None  Radiology OCT, Retina - OU - Both Eyes  Result Date: 05/19/2019 Right Eye Quality was good. Central Foveal Thickness: 322. Progression has improved. Findings include abnormal foveal contour, vitreomacular adhesion , no IRF, no SRF (Mac hole closed, superior macula obscured by gas bubble). Left Eye Quality was good. Central Foveal Thickness: 256. Progression has been stable. Findings include normal foveal contour, no IRF, no SRF (Partial PVD). Notes *Images captured and stored on drive Diagnosis / Impression: OD: Mac hole closed, superior macula obscured by gas bubble OS: NFP, no IRF/SRF; partial PVD  Clinical management: See below Abbreviations: NFP - Normal foveal profile. CME - cystoid macular edema. PED - pigment epithelial detachment. IRF - intraretinal fluid. SRF - subretinal fluid. EZ - ellipsoid zone. ERM - epiretinal membrane. ORA - outer retinal atrophy. ORT - outer retinal tubulation. SRHM - subretinal  hyper-reflective material    Procedures Procedures (including critical care time) CRITICAL CARE Performed by: Wenda Overland Josiyah Tozzi   Total critical care time: 30 minutes  Critical care time was exclusive of separately billable procedures and treating other patients.  Critical care was necessary to treat or prevent imminent or life-threatening deterioration.  Critical care was time spent personally by me on the following activities: development of treatment plan with patient and/or surrogate as well as nursing, discussions with consultants, evaluation of patient's response to treatment, examination of patient, obtaining history from patient or surrogate, ordering and performing treatments and interventions, ordering and review of laboratory studies, ordering and review of radiographic studies, pulse oximetry and re-evaluation of patient's condition.  Medications Ordered in ED Medications  ipratropium-albuterol (DUONEB) 0.5-2.5 (3) MG/3ML nebulizer solution 3 mL (has no administration in time range)  methylPREDNISolone sodium succinate (SOLU-MEDROL) 125 mg/2 mL injection 125 mg (has no administration in time range)  magnesium sulfate IVPB 2 g 50 mL (has no administration in time range)  albuterol (PROVENTIL,VENTOLIN) solution continuous neb (has no administration in time range)    ED Course  I have reviewed the triage vital signs and the nursing notes.  Pertinent labs & imaging results that were available during my care of the patient were reviewed by me and considered in my medical decision making (see chart for details).    MDM Rules/Calculators/A&P                      Patient in mild respiratory distress but mentating appropriately and speaking in full sentences.  Hypertensive, on a few liters supplemental oxygen to maintain saturations.  Diffuse wheezing throughout.  No significant infectious symptoms to suggest COVID-19. Gave 3 back-to-back neb treatments, solumedrol and Mg.    On several reassessments, the patient appeared to be improving and on final reassessment after third neb, she stated that she felt better with some mild wheezing but the tightness had resolved. Oxygen level is normal on room air. Discussed continuing albuterol at home and steroid course. She has an upcoming appointment with her PCP and can be reassessed then. I have extensively reviewed return precautions and she voiced understanding. Final Clinical Impression(s) / ED Diagnoses Final diagnoses:  None    Rx / DC Orders ED Discharge Orders    None       Carolyn Maniscalco, Wenda Overland, MD 05/21/19 1312

## 2019-05-21 NOTE — ED Notes (Signed)
This pt pressed call light with difficulty breathing.  Increased LPN to 3 via [place]. Pt reports relief and SPO2 98%

## 2019-05-21 NOTE — ED Notes (Signed)
Pt's O2 saturation was 88-89% on RA. This RN placed pt on 2L 

## 2019-05-21 NOTE — ED Triage Notes (Signed)
Pt c/o tightening of the chest and wheezing. States that this started a few days after her surgery (she was intubated). NAD noted at this time.

## 2019-06-09 ENCOUNTER — Encounter (INDEPENDENT_AMBULATORY_CARE_PROVIDER_SITE_OTHER): Payer: Self-pay | Admitting: Ophthalmology

## 2019-06-09 ENCOUNTER — Ambulatory Visit (INDEPENDENT_AMBULATORY_CARE_PROVIDER_SITE_OTHER): Payer: 59 | Admitting: Ophthalmology

## 2019-06-09 ENCOUNTER — Other Ambulatory Visit: Payer: Self-pay

## 2019-06-09 DIAGNOSIS — H43821 Vitreomacular adhesion, right eye: Secondary | ICD-10-CM

## 2019-06-09 DIAGNOSIS — H25813 Combined forms of age-related cataract, bilateral: Secondary | ICD-10-CM

## 2019-06-09 DIAGNOSIS — H3581 Retinal edema: Secondary | ICD-10-CM | POA: Diagnosis not present

## 2019-06-09 DIAGNOSIS — H35341 Macular cyst, hole, or pseudohole, right eye: Secondary | ICD-10-CM

## 2019-06-09 MED ORDER — LATANOPROST 0.005 % OP SOLN
1.0000 [drp] | Freq: Every day | OPHTHALMIC | 1 refills | Status: DC
Start: 2019-06-09 — End: 2019-06-23

## 2019-06-09 MED ORDER — ACETAZOLAMIDE 250 MG PO TABS
250.0000 mg | ORAL_TABLET | Freq: Three times a day (TID) | ORAL | 2 refills | Status: DC
Start: 2019-06-09 — End: 2020-01-10

## 2019-06-09 NOTE — Progress Notes (Addendum)
[facility] Note  06/09/2019     CHIEF COMPLAINT Patient presents for Eye Pain   HISTORY OF PRESENT ILLNESS: Angela [name] is a 64 y.o. female who presents to the clinic today for:   HPI    Eye Pain      In right eye.  Characterized as aching and sharp pain.  Pain was noted as 7/10.  Occurring constantly.  Duration of 1 week.  Associated symptoms include blurred vision, foreign body sensation, itching and tearing.  Treatments tried include eye drops.  I, the attending physician,  performed the HPI with the patient and updated documentation appropriately.          Comments    OD started having a constant achy pain x1 week ago.  She will have a sharp pain on occasion.  It feels more under her RUL.  Tylenol does help.  Before she was able to see faces and shapes.  She is now only seeing light and dark without shapes.  This started about 2 weeks ago OD.   Prednisolone, Brimonidine, Cosopt OD TID.  PSO ung qd until yesterday Dr. Coralyn Pear started her on TID.        Last edited by Bernarda Caffey, MD on 06/09/2019  1:32 PM. (History)    pt is here for decreased vision OD, she states she has been having eye pain and a couple weeks ago her vision began to decrease, she assumed it was bc of her cataract getting worse, when the vision continued to decrease, she decided to make an appt, pt states she has been using PF, Cosopt and brimonidine TID, pt states 2 weeks ago she was on po prednisone for 5 days due to an asthma attack  Referring physician: Alroy Dust, L.[name], [address] [place],  [place] [address]  HISTORICAL INFORMATION:   Selected notes from the MEDICAL RECORD NUMBER Referred by Dr. Shirley Muscat for mac hole OD   CURRENT MEDICATIONS: Current Outpatient Medications (Ophthalmic Drugs)  Medication Sig  . atropine 1 % ophthalmic solution Place 1 drop into the right eye in the morning and at bedtime.  . bacitracin-polymyxin b (POLYSPORIN)  ophthalmic ointment Place 1 application into the right eye at bedtime. apply to right eye as needed and at bedtime  . brimonidine (ALPHAGAN P) 0.1 % SOLN Place 1 drop into both eyes 3 (three) times daily.  . carboxymethylcellulose (REFRESH TEARS) 0.5 % SOLN Place 1 drop into both eyes 3 (three) times daily as needed (dry eyes).  . dorzolamide-timolol (COSOPT) 22.3-6.8 MG/ML ophthalmic solution Place 1 drop into the right eye 3 (three) times daily.  Marland Kitchen gatifloxacin (ZYMAXID) 0.5 % SOLN Place 1 drop into the right eye 4 (four) times daily.  Marland Kitchen latanoprost (XALATAN) 0.005 % ophthalmic solution Place 1 drop into both eyes at bedtime.  . prednisoLONE acetate (PRED FORTE) 1 % ophthalmic suspension Place 1 drop into the right eye 3 (three) times daily.   No current facility-administered medications for this visit. (Ophthalmic Drugs)   Current Outpatient Medications (Other)  Medication Sig  . acetaminophen (TYLENOL) 500 MG tablet Take 1,000 mg by mouth every 6 (six) hours as needed for moderate pain or headache.  Marland Kitchen acetaZOLAMIDE (DIAMOX) 250 MG tablet Take 1 tablet (250 mg total) by mouth 3 (three) times daily.  Marland Kitchen albuterol (PROVENTIL) (2.5 MG/3ML) 0.083% nebulizer solution Take 3 mLs (2.5 mg total) by nebulization every 6 (six) hours as needed for wheezing or shortness  of breath.  Marland Kitchen albuterol (VENTOLIN HFA) 108 (90 Base) MCG/ACT inhaler Inhale 2 puffs into the lungs every 4 (four) hours as needed for wheezing or shortness of breath.  . calcium carbonate (TUMS - DOSED IN MG ELEMENTAL CALCIUM) 500 MG chewable tablet Chew 2 tablets by mouth daily as needed for indigestion or heartburn.  . Dextromethorphan-guaiFENesin (TUSSIN DM MAX ADULT) 10-200 MG/5ML LIQD Take 10 mLs by mouth daily as needed (cough).  . diphenhydrAMINE (BENADRYL) 25 MG tablet Take 25 mg by mouth every 6 (six) hours as needed for itching or allergies.  . hydrocortisone cream 1 % Apply 1 application topically 2 (two) times daily as needed  (rash).  Marland Kitchen letrozole (FEMARA) 2.5 MG tablet Take 1 tablet (2.5 mg total) by mouth daily.  Marland Kitchen levothyroxine (SYNTHROID, LEVOTHROID) 125 MCG tablet Take 125 mcg by mouth daily before breakfast.  . loratadine (CLARITIN) 10 MG tablet Take 10 mg by mouth daily.  . montelukast (SINGULAIR) 10 MG tablet Take 10 mg by mouth at bedtime.  . predniSONE (DELTASONE) 20 MG tablet Take 2 tablets (40 mg total) by mouth daily.  Marland Kitchen triamcinolone (NASACORT AQ) 55 MCG/ACT AERO nasal inhaler Place 1 spray into the nose daily as needed (allergies).    No current facility-administered medications for this visit. (Other)      REVIEW OF SYSTEMS: ROS    Positive for: Gastrointestinal, Eyes, Respiratory   Negative for: Constitutional, Neurological, Skin, Genitourinary, Musculoskeletal, HENT, Endocrine, Cardiovascular, Psychiatric, Allergic/Imm, Heme/Lymph   Last edited by Leonie Douglas, COA on 06/09/2019  1:00 PM. (History)       ALLERGIES Allergies  Allergen Reactions  . Other Other (See Comments)    NO BLOOD PRODUCTS - PT IN AGREEMENT FOR ALBUMIN OR ALBUMIN CONTAINING PRODUCTS PER CONSENT  . Aspirin Other (See Comments)    Stomach ulcers   . Ibuprofen Other (See Comments)    Cautious by MD due to kidney function     PAST MEDICAL HISTORY Past Medical History:  Diagnosis Date  . Anxiety   . Asthma   . Asthma due to seasonal allergies   . Breast cancer ([place]) 07/2017   Left Breast Cancer  . Breast discharge   . Cataract    Mixed form OU  . Congestion of right ear   . Depression   . GERD (gastroesophageal reflux disease)   . Headache(784.0)   . Hyperlipidemia   . Hypothyroidism   . Seizures ([place])    hx of during childhood -not since age 24  . Sleep apnea    uses CPAP  . Thyroid disease   . Tinnitus   . Urticaria    Past Surgical History:  Procedure Laterality Date  . [place] VITRECTOMY WITH 20 GAUGE MVR PORT FOR MACULAR HOLE Right 04/29/2019   Procedure: 25 GAUGE PARS PLANA  VITRECTOMY WITH 20 GAUGE MVR PORT FOR MACULAR HOLE;  Surgeon: Bernarda Caffey, MD;  Location: [facility];  Service: Ophthalmology;  Laterality: Right;  . ANTERIOR AND POSTERIOR REPAIR  2007  . BREAST DUCTAL SYSTEM EXCISION  01/18/2011   Procedure: EXCISION DUCTAL SYSTEM BREAST;  Surgeon: Belva Crome, MD;  Location: [facility];  Service: General;  Laterality: Right;  . BREAST EXCISIONAL BIOPSY Right   . BREAST RECONSTRUCTION WITH PLACEMENT OF TISSUE EXPANDER AND ALLODERM Left 01/06/2018   Procedure: BREAST RECONSTRUCTION WITH PLACEMENT OF TISSUE EXPANDER AND ALLODERM;  Surgeon: Irene Limbo, MD;  Location: [facility];  Service: Plastics;  Laterality:  Left;  . BREAST SURGERY  01/18/2011   Right BX  . CYSTOSCOPY WITH URETHROLYSIS N/A 11/01/2014   Procedure: CYSTOSCOPY WITH URETHROLYSIS;  Surgeon: Bjorn Loser, MD;  Location: [facility];  Service: Urology;  Laterality: N/A;  . DILATION AND CURETTAGE OF UTERUS    . MASTECTOMY Left 12/2017  . PUBOVAGINAL SLING N/A 11/01/2014   Procedure: REMOVAL VAGINAL SLING AND URETHROLYSIS AND CYSTO;  Surgeon: Bjorn Loser, MD;  Location: [facility];  Service: Urology;  Laterality: N/A;  . REMOVAL OF TISSUE EXPANDER AND PLACEMENT OF IMPLANT Left 07/14/2018   Procedure: REMOVAL OF  LEFT TISSUE EXPANDER AND PLACEMENT OF IMPLANT;  Surgeon: Irene Limbo, MD;  Location: [facility];  Service: Plastics;  Laterality: Left;  . TUBAL LIGATION  1998  . TYMPANOSTOMY TUBE PLACEMENT  2010   right ear   . WISDOM TOOTH EXTRACTION      FAMILY HISTORY Family History  Problem Relation Age of Onset  . Cancer Father        throat  . Cancer Maternal Aunt        breast  . Breast cancer Maternal Aunt        in 63's  . Allergic rhinitis Neg Hx   . Asthma Neg Hx   . Eczema Neg Hx   . Immunodeficiency Neg Hx   . Urticaria Neg Hx     SOCIAL HISTORY Social History   Tobacco Use  . Smoking status: Never Smoker  .  Smokeless tobacco: Never Used  Substance Use Topics  . Alcohol use: No  . Drug use: No         OPHTHALMIC EXAM:  Base Eye Exam    Visual Acuity (Snellen - Linear)      Right Left   Dist cc HM 20/25   Dist ph cc NI    Correction: Glasses  Pt was able to see the light on the chart but unable to see the letter on the chart.  She was unable to count fingers OD.        Tonometry (Tonopen, 1:06 PM)      Right Left   Pressure 48 20       Tonometry #2 (Applanation, 1:14 PM)      Right Left   Pressure 58        Tonometry Comments   Instilled 1 gtt Cosopt and 1 gtt Brimonidine in office OD.       Pupils      Dark Light Shape React APD   Right 5 4 Round 2 +2   Left 4 3 Round Brisk None       Neuro/Psych    Oriented x3: Yes   Mood/Affect: Normal       Dilation    Both eyes: 1.0% Mydriacyl, 2.5% Phenylephrine @ 1:14 PM        Slit Lamp and Fundus Exam    Slit Lamp Exam      Right Left   Lids/Lashes Dermatochalasis - upper lid, mild Meibomian gland dysfunction Dermatochalasis - upper lid, mild Meibomian gland dysfunction   Conjunctiva/Sclera White and quiet White and quiet   Cornea Arcus; microcystic edema w/ mild bullae inferiorly, 2-3+Punctate epithelial erosions Arcus, trace Punctate epithelial erosions   Anterior Chamber Deep and quiet Deep and quiet   Iris Round and dilated Round and dilated   Lens 2-3+ Nuclear sclerosis, 2+ Cortical cataract, mild pigment on anterior capsule - improved 2+ Nuclear sclerosis, 2+ Cortical cataract   Vitreous post vitrectomy,  15-20% gas fill Vitreous syneresis, vitreous condensations       Fundus Exam      Right Left   Disc hazy view, mild Pallor, Sharp rim, +cupping and central pallor, ?inferior rim thinning Pink and Sharp   C/D Ratio 0.7 0.5   Macula Flat, mac hole closed, Blunted foveal reflex, No heme or edema Flat, Blunted foveal reflex, Retinal pigment epithelial mottling, No heme or edema   Vessels mild Vascular  attenuation, mild Tortuous mild Vascular attenuation   Periphery Hazy view improved, attached, good 360 peripheral laser changes in place, No heme   Attached             IMAGING AND PROCEDURES  Imaging and Procedures for _0 @  OCT, Retina - OU - Both Eyes       Right Eye Quality was borderline. Central Foveal Thickness: 316. Progression has been stable. Findings include abnormal foveal contour, no IRF, no SRF (Mac hole closed).   Left Eye Quality was good. Central Foveal Thickness: 257. Progression has been stable. Findings include normal foveal contour, no IRF, no SRF (Partial PVD).   Notes *Images captured and stored on drive  Diagnosis / Impression:  OD: Mac hole closed; no IRF/SRF OS: NFP, no IRF/SRF; partial PVD  Clinical management:  See below  Abbreviations: NFP - Normal foveal profile. CME - cystoid macular edema. PED - pigment epithelial detachment. IRF - intraretinal fluid. SRF - subretinal fluid. EZ - ellipsoid zone. ERM - epiretinal membrane. ORA - outer retinal atrophy. ORT - outer retinal tubulation. SRHM - subretinal hyper-reflective material                 ASSESSMENT/PLAN:    ICD-10-CM   1. Macular hole of right eye  H35.341   2. Vitreomacular adhesion of right eye  H43.821   3. Retinal edema  H35.81 OCT, Retina - OU - Both Eyes  4. Combined forms of age-related cataract of both eyes  H25.813     1-3. Macular hole with CME and residual vitreous traction OD  - +distortion and decreased vision OD  - pre op BCVA 20/70  - OCT showed macular hole w/ persistent CME and VMT  - s/p PPV/TissueBlue Stain/MP/14% C3F8 OS, 04.01.21             - mac hole closed and retina attached w/ good peripheral laser  - presented acutely today (5.12.21) for 1 wk history of eye pain/pressure and decreased vision             - IOP 48 with +microcystic edema and bullae; +rAPD OD -- steroid response/IOP spike?  - pt reports completing a course of po prednisone due to  asthma exacerbation last week  - gas bubble at ~25-30%             - cont PF TID OD -- decrease to BID   Brimonidine TID OD                         Cosopt TID OD  - add latanoprost OU QHS  - add po Diamox 265m TID              - cont face down positioning 50% of time; avoid laying flat on back              - post op drop and positioning instructions reviewed   - f/u Friday for IOP check  4. Mixed form age-related cataracts OU  -  The symptoms of cataract, surgical options, and treatments and risks were discussed with patient.  - discussed diagnosis and progression  - not yet visually significant  - discussed likelihood of cataract progression OD post PPV  - monitor for now   Ophthalmic Meds Ordered this visit:  Meds ordered this encounter  Medications  . latanoprost (XALATAN) 0.005 % ophthalmic solution    Sig: Place 1 drop into both eyes at bedtime.    Dispense:  2.5 mL    Refill:  1  . acetaZOLAMIDE (DIAMOX) 250 MG tablet    Sig: Take 1 tablet (250 mg total) by mouth 3 (three) times daily.    Dispense:  90 tablet    Refill:  2      Return in about 2 days (around 06/11/2019) for f/u IOP check, DFE, OCT overbook okay.  There are no Patient Instructions on file for this visit.    This document serves as a record of services personally performed by Gardiner Sleeper, MD, PhD. It was created on their behalf by Ernest Mallick, OA, an ophthalmic assistant. The creation of this record is the provider's dictation and/or activities during the visit.    Electronically signed by: Ernest Mallick, OA 04.21.2021 11:10 AM  This document serves as a record of services personally performed by Gardiner Sleeper, MD, PhD. It was created on their behalf by Ernest Mallick, OA, an ophthalmic assistant. The creation of this record is the provider's dictation and/or activities during the visit.    Electronically signed by: Ernest Mallick, OA 05.12.2021 9:55 PM   Gardiner Sleeper, M.D., Ph.D. Diseases &  Surgery of the Retina and Vitreous [facility]  I have reviewed the above documentation for accuracy and completeness, and I agree with the above. Gardiner Sleeper, M.D., Ph.D. 06/09/19 9:55 PM   Abbreviations: M myopia (nearsighted); A astigmatism; H hyperopia (farsighted); P presbyopia; Mrx spectacle prescription;  CTL contact lenses; OD right eye; OS left eye; OU both eyes  XT exotropia; ET esotropia; PEK punctate epithelial keratitis; PEE punctate epithelial erosions; DES dry eye syndrome; MGD meibomian gland dysfunction; ATs artificial tears; PFAT's preservative free artificial tears; [place] nuclear sclerotic cataract; PSC posterior subcapsular cataract; ERM epi-retinal membrane; PVD posterior vitreous detachment; RD retinal detachment; DM diabetes mellitus; DR diabetic retinopathy; NPDR non-proliferative diabetic retinopathy; PDR proliferative diabetic retinopathy; CSME clinically significant macular edema; DME diabetic macular edema; dbh dot blot hemorrhages; CWS cotton wool spot; POAG primary open angle glaucoma; C/D cup-to-disc ratio; HVF humphrey visual field; GVF goldmann visual field; OCT optical coherence tomography; IOP intraocular pressure; BRVO Branch retinal vein occlusion; CRVO central retinal vein occlusion; CRAO central retinal artery occlusion; BRAO branch retinal artery occlusion; RT retinal tear; SB scleral buckle; PPV pars plana vitrectomy; VH Vitreous hemorrhage; PRP panretinal laser photocoagulation; IVK intravitreal kenalog; VMT vitreomacular traction; MH Macular hole;  NVD neovascularization of the disc; NVE neovascularization elsewhere; AREDS age related eye disease study; ARMD age related macular degeneration; POAG primary open angle glaucoma; EBMD epithelial/anterior basement membrane dystrophy; ACIOL anterior chamber intraocular lens; IOL intraocular lens; PCIOL posterior chamber intraocular lens; Phaco/IOL phacoemulsification with intraocular lens placement;  [place] photorefractive keratectomy; LASIK laser assisted in situ keratomileusis; HTN hypertension; DM diabetes mellitus; COPD chronic obstructive pulmonary disease

## 2019-06-10 NOTE — Progress Notes (Signed)
[facility] Note  06/11/2019     CHIEF COMPLAINT Patient presents for Post-op Follow-up   HISTORY OF PRESENT ILLNESS: Angela [name] is a 64 y.o. female who presents to the clinic today for:   HPI    Post-op Follow-up    In right eye.  Discomfort includes pain.  I, the attending physician,  performed the HPI with the patient and updated documentation appropriately.          Comments    2 day follow up- sx OD 04/29/2019.  Pain went from an 8 to a 5.  Tearing has stopped.  Vision is improving.  She can see a little out of OD where 2 days ago she was not able. Brimonidine TID, Cosopt TID, Prednisolone BID OD, Latanoprost qhs OU, Diamox 228m po TID.  Patient is sitting face down when sitting or looking at her tablet.  She is sleeping face down.        Last edited by ZBernarda Caffey MD on 06/14/2019 12:38 AM. (History)    pt states her vision has improved today   Referring physician: MAlroy Dust L[address]arlou Sa [address] [place],  [place] [id number] HISTORICAL INFORMATION:   Selected notes from the MEDICAL RECORD NUMBER Referred by Dr. BShirley Muscatfor mac hole OD   CURRENT MEDICATIONS: Current Outpatient Medications (Ophthalmic Drugs)  Medication Sig  . atropine 1 % ophthalmic solution Place 1 drop into the right eye in the morning and at bedtime.  . bacitracin-polymyxin b (POLYSPORIN) ophthalmic ointment Place 1 application into the right eye at bedtime. apply to right eye as needed and at bedtime  . brimonidine (ALPHAGAN P) 0.1 % SOLN Place 1 drop into both eyes 3 (three) times daily.  . carboxymethylcellulose (REFRESH TEARS) 0.5 % SOLN Place 1 drop into both eyes 3 (three) times daily as needed (dry eyes).  . dorzolamide-timolol (COSOPT) 22.3-6.8 MG/ML ophthalmic solution Place 1 drop into the right eye 3 (three) times daily.  .Marland Kitchengatifloxacin (ZYMAXID) 0.5 % SOLN Place 1 drop into the right eye 4 (four) times daily.  .Marland Kitchenlatanoprost (XALATAN)  0.005 % ophthalmic solution Place 1 drop into both eyes at bedtime.  . prednisoLONE acetate (PRED FORTE) 1 % ophthalmic suspension Place 1 drop into the right eye 3 (three) times daily.   No current facility-administered medications for this visit. (Ophthalmic Drugs)   Current Outpatient Medications (Other)  Medication Sig  . acetaminophen (TYLENOL) 500 MG tablet Take 1,000 mg by mouth every 6 (six) hours as needed for moderate pain or headache.  .Marland KitchenacetaZOLAMIDE (DIAMOX) 250 MG tablet Take 1 tablet (250 mg total) by mouth 3 (three) times daily.  .Marland Kitchenalbuterol (PROVENTIL) (2.5 MG/3ML) 0.083% nebulizer solution Take 3 mLs (2.5 mg total) by nebulization every 6 (six) hours as needed for wheezing or shortness of breath.  .Marland Kitchenalbuterol (VENTOLIN HFA) 108 (90 Base) MCG/ACT inhaler Inhale 2 puffs into the lungs every 4 (four) hours as needed for wheezing or shortness of breath.  . calcium carbonate (TUMS - DOSED IN MG ELEMENTAL CALCIUM) 500 MG chewable tablet Chew 2 tablets by mouth daily as needed for indigestion or heartburn.  . Dextromethorphan-guaiFENesin (TUSSIN DM MAX ADULT) 10-200 MG/5ML LIQD Take 10 mLs by mouth daily as needed (cough).  . diphenhydrAMINE (BENADRYL) 25 MG tablet Take 25 mg by mouth every 6 (six) hours as needed for itching or allergies.  . hydrocortisone cream 1 % Apply 1 application topically 2 (two)  times daily as needed (rash).  Marland Kitchen letrozole (FEMARA) 2.5 MG tablet Take 1 tablet (2.5 mg total) by mouth daily.  Marland Kitchen levothyroxine (SYNTHROID, LEVOTHROID) 125 MCG tablet Take 125 mcg by mouth daily before breakfast.  . loratadine (CLARITIN) 10 MG tablet Take 10 mg by mouth daily.  . montelukast (SINGULAIR) 10 MG tablet Take 10 mg by mouth at bedtime.  . predniSONE (DELTASONE) 20 MG tablet Take 2 tablets (40 mg total) by mouth daily.  Marland Kitchen triamcinolone (NASACORT AQ) 55 MCG/ACT AERO nasal inhaler Place 1 spray into the nose daily as needed (allergies).    No current facility-administered  medications for this visit. (Other)      REVIEW OF SYSTEMS: ROS    Positive for: Gastrointestinal, Eyes, Respiratory   Negative for: Constitutional, Neurological, Skin, Genitourinary, Musculoskeletal, HENT, Endocrine, Cardiovascular, Psychiatric, Allergic/Imm, Heme/Lymph   Last edited by Leonie Douglas, COA on 06/11/2019  8:34 AM. (History)       ALLERGIES Allergies  Allergen Reactions  . Other Other (See Comments)    NO BLOOD PRODUCTS - PT IN AGREEMENT FOR ALBUMIN OR ALBUMIN CONTAINING PRODUCTS PER CONSENT  . Aspirin Other (See Comments)    Stomach ulcers   . Ibuprofen Other (See Comments)    Cautious by MD due to kidney function     PAST MEDICAL HISTORY Past Medical History:  Diagnosis Date  . Anxiety   . Asthma   . Asthma due to seasonal allergies   . Breast cancer ([place]) 07/2017   Left Breast Cancer  . Breast discharge   . Cataract    Mixed form OU  . Congestion of right ear   . Depression   . GERD (gastroesophageal reflux disease)   . Headache(784.0)   . Hyperlipidemia   . Hypothyroidism   . Seizures ([place])    hx of during childhood -not since age 47  . Sleep apnea    uses CPAP  . Thyroid disease   . Tinnitus   . Urticaria    Past Surgical History:  Procedure Laterality Date  . [place] VITRECTOMY WITH 20 GAUGE MVR PORT FOR MACULAR HOLE Right 04/29/2019   Procedure: 25 GAUGE PARS PLANA VITRECTOMY WITH 20 GAUGE MVR PORT FOR MACULAR HOLE;  Surgeon: Bernarda Caffey, MD;  Location: [facility];  Service: Ophthalmology;  Laterality: Right;  . ANTERIOR AND POSTERIOR REPAIR  2007  . BREAST DUCTAL SYSTEM EXCISION  01/18/2011   Procedure: EXCISION DUCTAL SYSTEM BREAST;  Surgeon: Belva Crome, MD;  Location: [facility];  Service: General;  Laterality: Right;  . BREAST EXCISIONAL BIOPSY Right   . BREAST RECONSTRUCTION WITH PLACEMENT OF TISSUE EXPANDER AND ALLODERM Left 01/06/2018   Procedure: BREAST RECONSTRUCTION WITH PLACEMENT OF TISSUE EXPANDER  AND ALLODERM;  Surgeon: Irene Limbo, MD;  Location: [facility];  Service: Plastics;  Laterality: Left;  . BREAST SURGERY  01/18/2011   Right BX  . CYSTOSCOPY WITH URETHROLYSIS N/A 11/01/2014   Procedure: CYSTOSCOPY WITH URETHROLYSIS;  Surgeon: Bjorn Loser, MD;  Location: [facility];  Service: Urology;  Laterality: N/A;  . DILATION AND CURETTAGE OF UTERUS    . MASTECTOMY Left 12/2017  . PUBOVAGINAL SLING N/A 11/01/2014   Procedure: REMOVAL VAGINAL SLING AND URETHROLYSIS AND CYSTO;  Surgeon: Bjorn Loser, MD;  Location: [facility];  Service: Urology;  Laterality: N/A;  . REMOVAL OF TISSUE EXPANDER AND PLACEMENT OF IMPLANT Left 07/14/2018   Procedure: REMOVAL OF  LEFT TISSUE EXPANDER AND PLACEMENT OF IMPLANT;  Surgeon: Irene Limbo, MD;  Location: [facility];  Service: Plastics;  Laterality: Left;  . TUBAL LIGATION  1998  . TYMPANOSTOMY TUBE PLACEMENT  2010   right ear   . WISDOM TOOTH EXTRACTION      FAMILY HISTORY Family History  Problem Relation Age of Onset  . Cancer Father        throat  . Cancer Maternal Aunt        breast  . Breast cancer Maternal Aunt        in 58's  . Allergic rhinitis Neg Hx   . Asthma Neg Hx   . Eczema Neg Hx   . Immunodeficiency Neg Hx   . Urticaria Neg Hx     SOCIAL HISTORY Social History   Tobacco Use  . Smoking status: Never Smoker  . Smokeless tobacco: Never Used  Substance Use Topics  . Alcohol use: No  . Drug use: No         OPHTHALMIC EXAM:  Base Eye Exam    Visual Acuity (Snellen - Linear)      Right Left   Dist cc 20/150 +2 20/25   Dist ph cc NI    Correction: Glasses       Tonometry (Tonopen, 8:41 AM)      Right Left   Pressure 27 17       Pupils      Dark Light Shape React APD   Right 4 3 Round Brisk +1   Left 3 2 Round Brisk None       Visual Fields (Counting fingers)      Left Right    Full    Restrictions  Total superior nasal, inferior nasal deficiencies        Extraocular Movement      Right Left    Full Full       Neuro/Psych    Oriented x3: Yes   Mood/Affect: Normal       Dilation    Right eye: 1.0% Mydriacyl, 2.5% Phenylephrine @ 8:41 AM        Slit Lamp and Fundus Exam    Slit Lamp Exam      Right Left   Lids/Lashes Dermatochalasis - upper lid, mild Meibomian gland dysfunction Dermatochalasis - upper lid, mild Meibomian gland dysfunction   Conjunctiva/Sclera White and quiet White and quiet   Cornea Arcus; microcystic edema w/ mild bullae inferiorly resolved, trace Punctate epithelial erosions, endopigment Arcus, trace Punctate epithelial erosions   Anterior Chamber Deep and quiet Deep and quiet   Iris Round and dilated Round and dilated   Lens 2-3+ Nuclear sclerosis, 2+ Cortical cataract, mild pigment on anterior capsule - improved 2+ Nuclear sclerosis, 2+ Cortical cataract   Vitreous post vitrectomy, 15-20% gas fill Vitreous syneresis, vitreous condensations       Fundus Exam      Right Left   Disc Pink and Sharp, +cupping, ?thin inferior rim Pink and Sharp   C/D Ratio 0.7 0.5   Macula Flat, mac hole closed, Blunted foveal reflex, No heme or edema Flat, Blunted foveal reflex, Retinal pigment epithelial mottling, No heme or edema   Vessels mild Vascular attenuation, mild Tortuous mild Vascular attenuation   Periphery Hazy view improved, attached, good 360 peripheral laser changes in place, No heme   Attached             IMAGING AND PROCEDURES  Imaging and Procedures for _0 @  OCT, Retina - OU - Both Eyes  Right Eye Quality was good. Central Foveal Thickness: 285. Progression has been stable. Findings include abnormal foveal contour, no IRF, no SRF (Mac hole closed).   Left Eye Quality was good. Central Foveal Thickness: 259. Progression has been stable. Findings include normal foveal contour, no IRF, no SRF (Partial PVD).   Notes *Images captured and stored on drive  Diagnosis / Impression:  OD: Mac hole  closed; no IRF/SRF OS: NFP, no IRF/SRF; partial PVD  Clinical management:  See below  Abbreviations: NFP - Normal foveal profile. CME - cystoid macular edema. PED - pigment epithelial detachment. IRF - intraretinal fluid. SRF - subretinal fluid. EZ - ellipsoid zone. ERM - epiretinal membrane. ORA - outer retinal atrophy. ORT - outer retinal tubulation. SRHM - subretinal hyper-reflective material                 ASSESSMENT/PLAN:    ICD-10-CM   1. Macular hole of right eye  H35.341   2. Vitreomacular adhesion of right eye  H43.821   3. Retinal edema  H35.81 OCT, Retina - OU - Both Eyes  4. Combined forms of age-related cataract of both eyes  H25.813     1-3. Macular hole with CME and residual vitreous traction OD  - +distortion and decreased vision OD  - pre op BCVA 20/70  - OCT showed macular hole w/ persistent CME and VMT  - s/p PPV/TissueBlue Stain/MP/14% C3F8 OS, 04.01.21             - mac hole closed and retina attached w/ good peripheral laser  - presented acutely (5.12.21) for 1 wk history of eye pain/pressure and decreased vision -- IOP was 50+, BCVA was HM, and cornea had MCE +             - IOP 27 improved with the addition of diamox 250 mg TID and latanoprost qhs OU  - resolved microcystic edema and bullae; +rAPD OD -- steroid response/IOP spike?  - pt reports completing a course of po prednisone due to asthma exacerbation last week  - gas bubble at ~25-30%             - cont PF BID OD -- okay to stop     Brimonidine TID OD                         Cosopt TID OD  - cont  latanoprost OU QHS  - cont  po Diamox 262m TID              - avoid laying flat on back              - post op drop and positioning instructions reviewed   - [facility] of [facility]  - f/u here May 26 as scheduled  4. Mixed form age-related cataracts OU  - The symptoms of cataract, surgical options, and  treatments and risks were discussed with patient.  - discussed diagnosis and progression  - not yet visually significant  - discussed likelihood of cataract progression OD post PPV  - monitor for now   Ophthalmic Meds Ordered this visit:  No orders of the defined types were placed in this encounter.     Return for f/u May 26 as scheduled.  There are no Patient Instructions on file for this visit.  This document serves as a record  of services personally performed by Gardiner Sleeper, MD, PhD. It was created on their behalf by Ernest Mallick, OA, an ophthalmic assistant. The creation of this record is the provider's dictation and/or activities during the visit.    Electronically signed by: Ernest Mallick, OA 05.13.2021 12:45 AM   Gardiner Sleeper, M.D., Ph.D. Diseases & Surgery of the Retina and Vitreous [facility]  I have reviewed the above documentation for accuracy and completeness, and I agree with the above. Gardiner Sleeper, M.D., Ph.D. 06/14/19 12:45 AM   Abbreviations: M myopia (nearsighted); A astigmatism; H hyperopia (farsighted); P presbyopia; Mrx spectacle prescription;  CTL contact lenses; OD right eye; OS left eye; OU both eyes  XT exotropia; ET esotropia; PEK punctate epithelial keratitis; PEE punctate epithelial erosions; DES dry eye syndrome; MGD meibomian gland dysfunction; ATs artificial tears; PFAT's preservative free artificial tears; [place] nuclear sclerotic cataract; PSC posterior subcapsular cataract; ERM epi-retinal membrane; PVD posterior vitreous detachment; RD retinal detachment; DM diabetes mellitus; DR diabetic retinopathy; NPDR non-proliferative diabetic retinopathy; PDR proliferative diabetic retinopathy; CSME clinically significant macular edema; DME diabetic macular edema; dbh dot blot hemorrhages; CWS cotton wool spot; POAG primary open angle glaucoma; C/D cup-to-disc ratio; HVF humphrey visual field; GVF goldmann visual field; OCT optical  coherence tomography; IOP intraocular pressure; BRVO Branch retinal vein occlusion; CRVO central retinal vein occlusion; CRAO central retinal artery occlusion; BRAO branch retinal artery occlusion; RT retinal tear; SB scleral buckle; PPV pars plana vitrectomy; VH Vitreous hemorrhage; PRP panretinal laser photocoagulation; IVK intravitreal kenalog; VMT vitreomacular traction; MH Macular hole;  NVD neovascularization of the disc; NVE neovascularization elsewhere; AREDS age related eye disease study; ARMD age related macular degeneration; POAG primary open angle glaucoma; EBMD epithelial/anterior basement membrane dystrophy; ACIOL anterior chamber intraocular lens; IOL intraocular lens; PCIOL posterior chamber intraocular lens; Phaco/IOL phacoemulsification with intraocular lens placement; [place] photorefractive keratectomy; LASIK laser assisted in situ keratomileusis; HTN hypertension; DM diabetes mellitus; COPD chronic obstructive pulmonary disease

## 2019-06-11 ENCOUNTER — Other Ambulatory Visit: Payer: Self-pay

## 2019-06-11 ENCOUNTER — Ambulatory Visit (INDEPENDENT_AMBULATORY_CARE_PROVIDER_SITE_OTHER): Payer: 59 | Admitting: Ophthalmology

## 2019-06-11 DIAGNOSIS — H3581 Retinal edema: Secondary | ICD-10-CM

## 2019-06-11 DIAGNOSIS — H43821 Vitreomacular adhesion, right eye: Secondary | ICD-10-CM

## 2019-06-11 DIAGNOSIS — H25813 Combined forms of age-related cataract, bilateral: Secondary | ICD-10-CM

## 2019-06-11 DIAGNOSIS — H35341 Macular cyst, hole, or pseudohole, right eye: Secondary | ICD-10-CM

## 2019-06-14 ENCOUNTER — Encounter (INDEPENDENT_AMBULATORY_CARE_PROVIDER_SITE_OTHER): Payer: Self-pay | Admitting: Ophthalmology

## 2019-06-15 NOTE — Progress Notes (Addendum)
[facility] Note  06/23/2019     CHIEF COMPLAINT Patient presents for Post-op Follow-up   HISTORY OF PRESENT ILLNESS: Angela [name] is a 64 y.o. female who presents to the clinic today for:   HPI    Post-op Follow-up    In right eye.  Vision is stable.  I, the attending physician,  performed the HPI with the patient and updated documentation appropriately.          Comments    8 week post op mac hole OD- Vision fluctuates, it will clear up then become blurred later on.  She is still having problems with her IOP OD.  Dr. Olam Idler changed her Latanoprost to Rocklatan OU qhs.  Brimonidine TID, Dorzolamide TID, and Prednisolone BID OD.  She has a sample of Alphagan P to use in OS.   Her PCP wants to start her on Symbicort.  He wants her to check with you first to make sure it will be ok.       Last edited by Bernarda Caffey, MD on 06/23/2019  1:29 PM. (History)    pt states she saw Dr. Shirley Muscat last Monday and last Thursday, she states her IOP was 27 last Thursday in his office, pt states she is still taking diamox which is making her "exhausted", Dr. Shirley Muscat changed her latanoprost to Orason, she states Dr. Shirley Muscat was going to talk to Dr. Shirleen Schirmer for IOP control, but she has not heard back from their office yet, she is still taking brimonidine and Cosopt BID OD  Referring physician: Alroy Dust, L.[name], [place] [address] [place],  [place] [address]  HISTORICAL INFORMATION:   Selected notes from the MEDICAL RECORD NUMBER Referred by Dr. Shirley Muscat for mac hole OD   CURRENT MEDICATIONS: Current Outpatient Medications (Ophthalmic Drugs)  Medication Sig  . atropine 1 % ophthalmic solution Place 1 drop into the right eye in the morning and at bedtime.  . bacitracin-polymyxin b (POLYSPORIN) ophthalmic ointment Place 1 application into the right eye at bedtime. apply to right eye as needed and at bedtime  . brimonidine (ALPHAGAN P) 0.1 %  SOLN Place 1 drop into both eyes 3 (three) times daily.  . dorzolamide-timolol (COSOPT) 22.3-6.8 MG/ML ophthalmic solution Place 1 drop into the right eye 3 (three) times daily.  . prednisoLONE acetate (PRED FORTE) 1 % ophthalmic suspension Place 1 drop into the right eye 3 (three) times daily.   No current facility-administered medications for this visit. (Ophthalmic Drugs)   Current Outpatient Medications (Other)  Medication Sig  . acetaminophen (TYLENOL) 500 MG tablet Take 1,000 mg by mouth every 6 (six) hours as needed for moderate pain or headache.  Marland Kitchen acetaZOLAMIDE (DIAMOX) 250 MG tablet Take 1 tablet (250 mg total) by mouth 3 (three) times daily.  Marland Kitchen albuterol (PROAIR HFA) 108 (90 Base) MCG/ACT inhaler Inhale 2 puffs into the lungs every 4 (four) hours as needed for wheezing or shortness of breath.  Marland Kitchen albuterol (PROVENTIL) (2.5 MG/3ML) 0.083% nebulizer solution Take 3 mLs (2.5 mg total) by nebulization every 6 (six) hours as needed for wheezing or shortness of breath.  Marland Kitchen albuterol (PROVENTIL) (2.5 MG/3ML) 0.083% nebulizer solution Take 3 mLs (2.5 mg total) by nebulization every 6 (six) hours as needed for wheezing or shortness of breath.  Marland Kitchen albuterol (VENTOLIN HFA) 108 (90 Base) MCG/ACT inhaler Inhale 2 puffs into the lungs every 4 (four) hours as needed for wheezing or shortness of breath.  Marland Kitchen  azelastine (ASTELIN) 0.1 % nasal spray 1-2 sprays each nostril 2 times daily as needed  . budesonide-formoterol (SYMBICORT) 160-4.5 MCG/ACT inhaler Inhale 2 puffs into the lungs 2 (two) times daily.  . diphenhydrAMINE (BENADRYL) 25 MG tablet Take 25 mg by mouth every 6 (six) hours as needed for itching or allergies.  Marland Kitchen letrozole (FEMARA) 2.5 MG tablet Take 1 tablet (2.5 mg total) by mouth daily.  Marland Kitchen levothyroxine (SYNTHROID, LEVOTHROID) 125 MCG tablet Take 125 mcg by mouth daily before breakfast.  . loratadine (CLARITIN) 10 MG tablet Take 10 mg by mouth daily.  . montelukast (SINGULAIR) 10 MG tablet  Take 10 mg by mouth at bedtime.  . triamcinolone (NASACORT AQ) 55 MCG/ACT AERO nasal inhaler Place 1 spray into the nose daily as needed (allergies).    No current facility-administered medications for this visit. (Other)      REVIEW OF SYSTEMS: ROS    Positive for: Gastrointestinal, Eyes, Respiratory   Negative for: Constitutional, Neurological, Skin, Genitourinary, Musculoskeletal, HENT, Endocrine, Cardiovascular, Psychiatric, Allergic/Imm, Heme/Lymph   Last edited by Leonie Douglas, COA on 06/23/2019  1:11 PM. (History)       ALLERGIES Allergies  Allergen Reactions  . Other Other (See Comments)    NO BLOOD PRODUCTS - PT IN AGREEMENT FOR ALBUMIN OR ALBUMIN CONTAINING PRODUCTS PER CONSENT  . Aspirin Other (See Comments)    Stomach ulcers   . Ibuprofen Other (See Comments)    Cautious by MD due to kidney function     PAST MEDICAL HISTORY Past Medical History:  Diagnosis Date  . Anxiety   . Asthma   . Asthma due to seasonal allergies   . Breast cancer ([place]) 07/2017   Left Breast Cancer  . Breast discharge   . Cataract    Mixed form OU  . Congestion of right ear   . Depression   . GERD (gastroesophageal reflux disease)   . Headache(784.0)   . Hyperlipidemia   . Hypothyroidism   . Seizures ([place])    hx of during childhood -not since age 29  . Sleep apnea    uses CPAP  . Thyroid disease   . Tinnitus   . Urticaria    Past Surgical History:  Procedure Laterality Date  . [place] VITRECTOMY WITH 20 GAUGE MVR PORT FOR MACULAR HOLE Right 04/29/2019   Procedure: 25 GAUGE PARS PLANA VITRECTOMY WITH 20 GAUGE MVR PORT FOR MACULAR HOLE;  Surgeon: Bernarda Caffey, MD;  Location: [place];  Service: Ophthalmology;  Laterality: Right;  . ANTERIOR AND POSTERIOR REPAIR  2007  . BREAST DUCTAL SYSTEM EXCISION  01/18/2011   Procedure: EXCISION DUCTAL SYSTEM BREAST;  Surgeon: Belva Crome, MD;  Location: [place];  Service: General;  Laterality: Right;  .  BREAST EXCISIONAL BIOPSY Right   . BREAST RECONSTRUCTION WITH PLACEMENT OF TISSUE EXPANDER AND ALLODERM Left 01/06/2018   Procedure: BREAST RECONSTRUCTION WITH PLACEMENT OF TISSUE EXPANDER AND ALLODERM;  Surgeon: Irene Limbo, MD;  Location: [facility];  Service: Plastics;  Laterality: Left;  . BREAST SURGERY  01/18/2011   Right BX  . CYSTOSCOPY WITH URETHROLYSIS N/A 11/01/2014   Procedure: CYSTOSCOPY WITH URETHROLYSIS;  Surgeon: Bjorn Loser, MD;  Location: [facility];  Service: Urology;  Laterality: N/A;  . DILATION AND CURETTAGE OF UTERUS    . MASTECTOMY Left 12/2017  . PUBOVAGINAL SLING N/A 11/01/2014   Procedure: REMOVAL VAGINAL SLING AND URETHROLYSIS AND CYSTO;  Surgeon: Bjorn Loser, MD;  Location: [facility];  Service: Urology;  Laterality: N/A;  . REMOVAL OF TISSUE EXPANDER AND PLACEMENT OF IMPLANT Left 07/14/2018   Procedure: REMOVAL OF  LEFT TISSUE EXPANDER AND PLACEMENT OF IMPLANT;  Surgeon: Irene Limbo, MD;  Location: [facility];  Service: Plastics;  Laterality: Left;  . TUBAL LIGATION  1998  . TYMPANOSTOMY TUBE PLACEMENT  2010   right ear   . WISDOM TOOTH EXTRACTION      FAMILY HISTORY Family History  Problem Relation Age of Onset  . Cancer Father        throat  . Cancer Maternal Aunt        breast  . Breast cancer Maternal Aunt        in 70's  . Allergic rhinitis Neg Hx   . Asthma Neg Hx   . Eczema Neg Hx   . Immunodeficiency Neg Hx   . Urticaria Neg Hx     SOCIAL HISTORY Social History   Tobacco Use  . Smoking status: Never Smoker  . Smokeless tobacco: Never Used  Substance Use Topics  . Alcohol use: No  . Drug use: No         OPHTHALMIC EXAM:  Base Eye Exam    Visual Acuity (Snellen - Linear)      Right Left   Dist cc 20/150 +2 20/25   Dist ph cc 20/100 +2        Tonometry (Tonopen, 1:20 PM)      Right Left   Pressure 18 9       Pupils      Dark Light Shape React APD   Right 4 3 Round Slow +1    Left 3 2 Round Brisk None       Visual Fields      Left Right    Full    Restrictions  Total superior nasal deficiency; Partial outer inferior nasal deficiency       Extraocular Movement      Right Left    Full Full       Neuro/Psych    Oriented x3: Yes   Mood/Affect: Normal       Dilation    Both eyes: 1.0% Mydriacyl, 2.5% Phenylephrine @ 1:20 PM        Slit Lamp and Fundus Exam    Slit Lamp Exam      Right Left   Lids/Lashes Dermatochalasis - upper lid, mild Meibomian gland dysfunction Dermatochalasis - upper lid, mild Meibomian gland dysfunction   Conjunctiva/Sclera White and quiet White and quiet   Cornea Arcus, 1+Punctate epithelial erosions, endopigment, microcystic edema resolved Arcus, trace Punctate epithelial erosions   Anterior Chamber Deep, 1-2+cell/pigment Deep and quiet   Iris Round and dilated Round and dilated   Lens 2-3+ Nuclear sclerosis, 2+ Cortical cataract, mild pigment on anterior capsule - improved 2+ Nuclear sclerosis, 2+ Cortical cataract   Vitreous post vitrectomy, 15-20% gas fill Vitreous syneresis, vitreous condensations       Fundus Exam      Right Left   Disc Pink and Sharp, +cupping, thin inferior rim Pink and Sharp   C/D Ratio 0.85 0.5   Macula Flat, mac hole closed, Blunted foveal reflex, No heme or edema Flat, Blunted foveal reflex, Retinal pigment epithelial mottling, No heme or edema   Vessels mild Vascular attenuation, mild Tortuous mild Vascular attenuation   Periphery Hazy view improved, attached, good 360 peripheral laser changes in place, No heme   Attached  IMAGING AND PROCEDURES  Imaging and Procedures for _0 @  OCT, Retina - OU - Both Eyes       Right Eye Quality was good. Central Foveal Thickness: 267. Progression has been stable. Findings include abnormal foveal contour, no IRF, no SRF (Mac hole closed; trace central disruption of ellipsoid).   Left Eye Quality was good. Central Foveal Thickness:  255. Progression has been stable. Findings include normal foveal contour, no IRF, no SRF (Partial PVD).   Notes *Images captured and stored on drive  Diagnosis / Impression:  OD: Mac hole closed; no IRF/SRF OS: NFP, no IRF/SRF; partial PVD  Clinical management:  See below  Abbreviations: NFP - Normal foveal profile. CME - cystoid macular edema. PED - pigment epithelial detachment. IRF - intraretinal fluid. SRF - subretinal fluid. EZ - ellipsoid zone. ERM - epiretinal membrane. ORA - outer retinal atrophy. ORT - outer retinal tubulation. SRHM - subretinal hyper-reflective material                 ASSESSMENT/PLAN:    ICD-10-CM   1. Macular hole of right eye  H35.341   2. Vitreomacular adhesion of right eye  H43.821   3. Retinal edema  H35.81 OCT, Retina - OU - Both Eyes  4. Combined forms of age-related cataract of both eyes  H25.813     1-3. Macular hole with CME and residual vitreous traction OD  - s/p PPV/TissueBlue Stain/MP/14% C3F8 OS, 04.01.21             - mac hole closed and retina attached w/ good peripheral laser  - presented acutely (5.12.21) for 1 wk history of eye pain/pressure and decreased vision -- IOP was 50+, BCVA was HM, and cornea had MCE +             - today, IOP improved to 18 with the addition of diamox 250 mg TID and latanoprost qhs OU (latan changed to Rocklatan by Dr. Shirley Muscat)  - resolved microcystic edema and bullae; +rAPD OD -- steroid response/IOP spike?  - pt reports completing a course of po prednisone due to asthma exacerbation  - gas bubble at ~15-20%  - AC still with 1-2+ cell/pigment             - cont PF BID OD   Brimonidine TID OD                         Cosopt TID OD  - cont Rocklatan OU QHS per Dr. Shirley Muscat  - cont po Diamox 237m TID -- decrease to BID             - avoid laying flat on back              - post op drop and positioning instructions reviewed  - okay to start Symbicort -- will monitor closely for IOP spike  - f/u  1 week, IOP check, DFE, OCT  4. Mixed form age-related cataracts OU  - The symptoms of cataract, surgical options, and treatments and risks were discussed with patient.  - discussed diagnosis and progression  - not yet visually significant  - discussed likelihood of cataract progression OD post PPV  - monitor for now   Ophthalmic Meds Ordered this visit:  No orders of the defined types were placed in this encounter.     Return in about 1 week (around 06/30/2019) for f/u IOP check, DFE, OCT.  There are no Patient Instructions on  file for this visit.  This document serves as a record of services personally performed by Gardiner Sleeper, MD, PhD. It was created on their behalf by Ernest Mallick, OA, an ophthalmic assistant. The creation of this record is the provider's dictation and/or activities during the visit.    Electronically signed by: Ernest Mallick, OA 05.26.2021 11:58 PM  Gardiner Sleeper, M.D., Ph.D. Diseases & Surgery of the Retina and [place] 06/23/2019   I have reviewed the above documentation for accuracy and completeness, and I agree with the above. Gardiner Sleeper, M.D., Ph.D. 06/24/19 11:58 PM   Abbreviations: M myopia (nearsighted); A astigmatism; H hyperopia (farsighted); P presbyopia; Mrx spectacle prescription;  CTL contact lenses; OD right eye; OS left eye; OU both eyes  XT exotropia; ET esotropia; PEK punctate epithelial keratitis; PEE punctate epithelial erosions; DES dry eye syndrome; MGD meibomian gland dysfunction; ATs artificial tears; PFAT's preservative free artificial tears; [place] nuclear sclerotic cataract; PSC posterior subcapsular cataract; ERM epi-retinal membrane; PVD posterior vitreous detachment; RD retinal detachment; DM diabetes mellitus; DR diabetic retinopathy; NPDR non-proliferative diabetic retinopathy; PDR proliferative diabetic retinopathy; CSME clinically significant macular edema; DME diabetic macular edema; dbh  dot blot hemorrhages; CWS cotton wool spot; POAG primary open angle glaucoma; C/D cup-to-disc ratio; HVF humphrey visual field; GVF goldmann visual field; OCT optical coherence tomography; IOP intraocular pressure; BRVO Branch retinal vein occlusion; CRVO central retinal vein occlusion; CRAO central retinal artery occlusion; BRAO branch retinal artery occlusion; RT retinal tear; SB scleral buckle; PPV pars plana vitrectomy; VH Vitreous hemorrhage; PRP panretinal laser photocoagulation; IVK intravitreal kenalog; VMT vitreomacular traction; MH Macular hole;  NVD neovascularization of the disc; NVE neovascularization elsewhere; AREDS age related eye disease study; ARMD age related macular degeneration; POAG primary open angle glaucoma; EBMD epithelial/anterior basement membrane dystrophy; ACIOL anterior chamber intraocular lens; IOL intraocular lens; PCIOL posterior chamber intraocular lens; Phaco/IOL phacoemulsification with intraocular lens placement; [place] photorefractive keratectomy; LASIK laser assisted in situ keratomileusis; HTN hypertension; DM diabetes mellitus; COPD chronic obstructive pulmonary disease

## 2019-06-23 ENCOUNTER — Encounter: Payer: Self-pay | Admitting: Allergy and Immunology

## 2019-06-23 ENCOUNTER — Other Ambulatory Visit: Payer: Self-pay

## 2019-06-23 ENCOUNTER — Ambulatory Visit (INDEPENDENT_AMBULATORY_CARE_PROVIDER_SITE_OTHER): Payer: 59 | Admitting: Allergy and Immunology

## 2019-06-23 ENCOUNTER — Encounter (INDEPENDENT_AMBULATORY_CARE_PROVIDER_SITE_OTHER): Payer: Self-pay | Admitting: Ophthalmology

## 2019-06-23 ENCOUNTER — Ambulatory Visit (INDEPENDENT_AMBULATORY_CARE_PROVIDER_SITE_OTHER): Payer: 59 | Admitting: Ophthalmology

## 2019-06-23 VITALS — BP 136/96 | HR 104 | Temp 98.2°F | Resp 16 | Ht 65.5 in | Wt 239.8 lb

## 2019-06-23 DIAGNOSIS — H43821 Vitreomacular adhesion, right eye: Secondary | ICD-10-CM

## 2019-06-23 DIAGNOSIS — H35341 Macular cyst, hole, or pseudohole, right eye: Secondary | ICD-10-CM

## 2019-06-23 DIAGNOSIS — H3581 Retinal edema: Secondary | ICD-10-CM | POA: Diagnosis not present

## 2019-06-23 DIAGNOSIS — J3089 Other allergic rhinitis: Secondary | ICD-10-CM | POA: Diagnosis not present

## 2019-06-23 DIAGNOSIS — H25813 Combined forms of age-related cataract, bilateral: Secondary | ICD-10-CM

## 2019-06-23 DIAGNOSIS — J4541 Moderate persistent asthma with (acute) exacerbation: Secondary | ICD-10-CM | POA: Diagnosis not present

## 2019-06-23 DIAGNOSIS — J4540 Moderate persistent asthma, uncomplicated: Secondary | ICD-10-CM | POA: Insufficient documentation

## 2019-06-23 MED ORDER — AZELASTINE HCL 0.1 % NA SOLN: NASAL | 5 refills | Status: DC

## 2019-06-23 MED ORDER — ALBUTEROL SULFATE HFA 108 (90 BASE) MCG/ACT IN AERS
2.0000 | INHALATION_SPRAY | RESPIRATORY_TRACT | 1 refills | Status: DC | PRN
Start: 2019-06-23 — End: 2021-04-03

## 2019-06-23 MED ORDER — BUDESONIDE-FORMOTEROL FUMARATE 160-4.5 MCG/ACT IN AERO
2.0000 | INHALATION_SPRAY | Freq: Two times a day (BID) | RESPIRATORY_TRACT | 5 refills | Status: DC
Start: 2019-06-23 — End: 2021-04-03

## 2019-06-23 MED ORDER — ALBUTEROL SULFATE (2.5 MG/3ML) 0.083% IN NEBU
2.5000 mg | INHALATION_SOLUTION | Freq: Four times a day (QID) | RESPIRATORY_TRACT | 1 refills | Status: DC | PRN
Start: 2019-06-23 — End: 2020-08-01

## 2019-06-23 NOTE — Patient Instructions (Addendum)
Moderate persistent asthma  We will avoid oral steroids at this time due to recent retinal surgery.  A prescription has been provided for Symbicort (budesonide/formoterol) 160/4.5 g, 2 inhalations twice a day. To maximize pulmonary deposition, a spacer has been provided along with instructions for its proper administration with an HFA inhaler.  For now, continue montelukast 10 mg daily at bedtime and albuterol every 4-6 hours if needed.  The patient will consult with her retina specialist for his approval prior to starting Symbicort.  The patient has been asked to contact me if her symptoms persist or progress. Otherwise, she may return for follow up in 1 month.  Other allergic rhinitis The patient's history suggests seasonal allergic rhinitis.  However, we were unable to skin test today due to recent administration of antihistamine.  A prescription has been provided for azelastine nasal spray, 1-2 sprays per nostril 2 times daily as needed. Proper nasal spray technique has been discussed and demonstrated.   Hold Nasacort for now.  Nasal saline spray (i.e., Simply Saline) or nasal saline lavage (i.e., NeilMed) is recommended as needed and prior to medicated nasal sprays.  Continue loratadine for now.  The patient is scheduled to return in the near future for allergy skin testing after having been off of antihistamines for at least 3 days.  Further recommendations will be made at that time based upon skin test results.   Return in about 4 weeks (around 07/21/2019) for allergy skin testing after being off antihistamine for at least 3 days.

## 2019-06-23 NOTE — Assessment & Plan Note (Addendum)
   We will avoid oral steroids at this time due to recent retinal surgery.  A prescription has been provided for Symbicort (budesonide/formoterol) 160/4.5 g, 2 inhalations twice a day. To maximize pulmonary deposition, a spacer has been provided along with instructions for its proper administration with an HFA inhaler.  For now, continue montelukast 10 mg daily at bedtime and albuterol every 4-6 hours if needed.  The patient will consult with her retina specialist for his approval prior to starting Symbicort.  The patient has been asked to contact me if her symptoms persist or progress. Otherwise, she may return for follow up in 1 month.

## 2019-06-23 NOTE — Progress Notes (Signed)
New Patient Note  RE: Angela [name] MRN: [id number] DOB: 1955/07/02 Date of Office Visit: 06/23/2019  Referring provider: Alroy Dust, L.[name], MD Primary care provider: Alroy Dust, Carlean Jews.[name], MD  Chief Complaint: Asthma  History of present illness: Angela [name] is a 64 y.o. female seen today in consultation requested by Donnie Coffin, MD.  She was diagnosed with asthma in her late 48s.  She believes that the onset of asthma was triggered by inhaling fumes from [place].  For several years, her asthma was treated with Qvar.  However, over the past 2 or 3 years her asthma symptoms have resolved.  She believes that her asthma improved during the time that she was "keeping allergies under control."  However, in April, 2 weeks after retina surgery she had a "full-fledged asthma attack."  She was given 5-day course of prednisone, however during that time her vision declined in the healing eye so the prednisone was discontinued.  She currently takes montelukast 10 mg daily at bedtime and albuterol as needed.  She reports that recently she has been requiring albuterol rescue multiple times per day and is awakened from sleep every night from lower respiratory symptoms. She experiences nasal congestion, rhinorrhea, sneezing, and postnasal drainage.  She occasionally experiences mild ocular pruritus and/or lacrimation.  The symptoms are most frequent and severe during the springtime and she has to wear a mask when going outdoors into the yard.  She attempts to control her nasal allergy symptoms with Nasacort and loratadine daily without adequate symptom relief.  Assessment and plan: Moderate persistent asthma  We will avoid oral steroids at this time due to recent retinal surgery.  A prescription has been provided for Symbicort (budesonide/formoterol) 160/4.5 g, 2 inhalations twice a day. To maximize pulmonary deposition, a spacer has been provided along with instructions for its proper administration with an  HFA inhaler.  For now, continue montelukast 10 mg daily at bedtime and albuterol every 4-6 hours if needed.  The patient will consult with her retina specialist for his approval prior to starting Symbicort.  The patient has been asked to contact me if her symptoms persist or progress. Otherwise, she may return for follow up in 1 month.  Other allergic rhinitis The patient's history suggests seasonal allergic rhinitis.  However, we were unable to skin test today due to recent administration of antihistamine.  A prescription has been provided for azelastine nasal spray, 1-2 sprays per nostril 2 times daily as needed. Proper nasal spray technique has been discussed and demonstrated.   Hold Nasacort for now.  Nasal saline spray (i.e., Simply Saline) or nasal saline lavage (i.e., NeilMed) is recommended as needed and prior to medicated nasal sprays.  Continue loratadine for now.  The patient is scheduled to return in the near future for allergy skin testing after having been off of antihistamines for at least 3 days.  Further recommendations will be made at that time based upon skin test results.   Meds ordered this encounter  Medications  . albuterol (PROVENTIL) (2.5 MG/3ML) 0.083% nebulizer solution    Sig: Take 3 mLs (2.5 mg total) by nebulization every 6 (six) hours as needed for wheezing or shortness of breath.    Dispense:  75 mL    Refill:  1  . budesonide-formoterol (SYMBICORT) 160-4.5 MCG/ACT inhaler    Sig: Inhale 2 puffs into the lungs 2 (two) times daily.    Dispense:  1 Inhaler    Refill:  5  . azelastine (ASTELIN) 0.1 %  nasal spray    Sig: 1-2 sprays each nostril 2 times daily as needed    Dispense:  30 mL    Refill:  5  . albuterol (PROAIR HFA) 108 (90 Base) MCG/ACT inhaler    Sig: Inhale 2 puffs into the lungs every 4 (four) hours as needed for wheezing or shortness of breath.    Dispense:  18 g    Refill:  1    Diagnostics: Spirometry: Spirometry reveals an FVC  of 1.79 L and an FEV1 of 1.27 L (57% predicted) and an FEV1 ratio of 90% without postbronchodilator improvement.  Please see scanned spirometry results for details. Allergy skin testing: We were unable to perform skin tests today due to recent administration of antihistamine.     Physical examination: Blood pressure (!) 136/96, pulse (!) 104, temperature 98.2 F (36.8 C), temperature source Oral, resp. rate 16, height 5' 5.5" (1.664 m), weight 239 lb 12.8 oz (108.8 kg), SpO2 96 %.  General: Alert, interactive, in no acute distress. HEENT: TMs pearly gray, turbinates moderately edematous with clear discharge, post-pharynx erythematous and crowded. Neck: Supple without lymphadenopathy. Lungs: Mildly decreased breath sounds with expiratory wheezing bilaterally. CV: Normal S1, S2 without murmurs. Abdomen: Nondistended, nontender. Skin: Warm and dry, without lesions or rashes. Extremities:  No clubbing, cyanosis or edema. Neuro:   Grossly intact.  Review of systems:  Review of systems negative except as noted in HPI / PMHx or noted below: Review of Systems  Constitutional: Negative.   HENT: Negative.   Eyes: Negative.   Respiratory: Negative.   Cardiovascular: Negative.   Gastrointestinal: Negative.   Genitourinary: Negative.   Musculoskeletal: Negative.   Skin: Negative.   Neurological: Negative.   Endo/Heme/Allergies: Negative.   Psychiatric/Behavioral: Negative.     Past medical history:  Past Medical History:  Diagnosis Date  . Anxiety   . Asthma   . Asthma due to seasonal allergies   . Breast cancer ([place]) 07/2017   Left Breast Cancer  . Breast discharge   . Cataract    Mixed form OU  . Congestion of right ear   . Depression   . GERD (gastroesophageal reflux disease)   . Headache(784.0)   . Hyperlipidemia   . Hypothyroidism   . Seizures ([place])    hx of during childhood -not since age 64  . Sleep apnea    uses CPAP  . Thyroid disease   . Tinnitus   . Urticaria       Past surgical history:  Past Surgical History:  Procedure Laterality Date  . [place] VITRECTOMY WITH 20 GAUGE MVR PORT FOR MACULAR HOLE Right 04/29/2019   Procedure: 25 GAUGE PARS PLANA VITRECTOMY WITH 20 GAUGE MVR PORT FOR MACULAR HOLE;  Surgeon: Bernarda Caffey, MD;  Location: [facility];  Service: Ophthalmology;  Laterality: Right;  . ANTERIOR AND POSTERIOR REPAIR  2007  . BREAST DUCTAL SYSTEM EXCISION  01/18/2011   Procedure: EXCISION DUCTAL SYSTEM BREAST;  Surgeon: Belva Crome, MD;  Location: [facility];  Service: General;  Laterality: Right;  . BREAST EXCISIONAL BIOPSY Right   . BREAST RECONSTRUCTION WITH PLACEMENT OF TISSUE EXPANDER AND ALLODERM Left 01/06/2018   Procedure: BREAST RECONSTRUCTION WITH PLACEMENT OF TISSUE EXPANDER AND ALLODERM;  Surgeon: Irene Limbo, MD;  Location: [facility];  Service: Plastics;  Laterality: Left;  . BREAST SURGERY  01/18/2011   Right BX  . CYSTOSCOPY WITH URETHROLYSIS N/A 11/01/2014   Procedure: CYSTOSCOPY WITH URETHROLYSIS;  Surgeon: Bjorn Loser, MD;  Location: [facility];  Service: Urology;  Laterality: N/A;  . DILATION AND CURETTAGE OF UTERUS    . MASTECTOMY Left 12/2017  . PUBOVAGINAL SLING N/A 11/01/2014   Procedure: REMOVAL VAGINAL SLING AND URETHROLYSIS AND CYSTO;  Surgeon: Bjorn Loser, MD;  Location: [facility];  Service: Urology;  Laterality: N/A;  . REMOVAL OF TISSUE EXPANDER AND PLACEMENT OF IMPLANT Left 07/14/2018   Procedure: REMOVAL OF  LEFT TISSUE EXPANDER AND PLACEMENT OF IMPLANT;  Surgeon: Irene Limbo, MD;  Location: [facility];  Service: Plastics;  Laterality: Left;  . TUBAL LIGATION  1998  . TYMPANOSTOMY TUBE PLACEMENT  2010   right ear   . WISDOM TOOTH EXTRACTION      Family history: Family History  Problem Relation Age of Onset  . Cancer Father        throat  . Cancer Maternal Aunt        breast  . Breast cancer Maternal Aunt        in 57's  .  Allergic rhinitis Neg Hx   . Asthma Neg Hx   . Eczema Neg Hx   . Immunodeficiency Neg Hx   . Urticaria Neg Hx     Social history: Social History   Socioeconomic History  . Marital status: Single    Spouse name: Not on file  . Number of children: Not on file  . Years of education: Not on file  . Highest education level: Not on file  Occupational History  . Not on file  Tobacco Use  . Smoking status: Never Smoker  . Smokeless tobacco: Never Used  Substance and Sexual Activity  . Alcohol use: No  . Drug use: No  . Sexual activity: Never    Birth control/protection: Post-menopausal    Comment: meno  Other Topics Concern  . Not on file  Social History Narrative  . Not on file   Social Determinants of Health   Financial Resource Strain:   . Difficulty of Paying Living Expenses:   Food Insecurity:   . Worried About Charity fundraiser in the Last Year:   . Arboriculturist in the Last Year:   Transportation Needs:   . Film/video editor (Medical):   Marland Kitchen Lack of Transportation (Non-Medical):   Physical Activity:   . Days of Exercise per Week:   . Minutes of Exercise per Session:   Stress:   . Feeling of Stress :   Social Connections:   . Frequency of Communication with Friends and Family:   . Frequency of Social Gatherings with Friends and Family:   . Attends Religious Services:   . Active Member of Clubs or Organizations:   . Attends Archivist Meetings:   Marland Kitchen Marital Status:   Intimate Partner Violence:   . Fear of Current or Ex-Partner:   . Emotionally Abused:   Marland Kitchen Physically Abused:   . Sexually Abused:     Environmental History: The patient lives in a 62 year old house with carpeting in the bedroom, gassy, and central air.  There is no known mold/water damage in the home.  There are no pets in the home.  She is a non-smoker.  Current Outpatient Medications  Medication Sig Dispense Refill  . acetaminophen (TYLENOL) 500 MG tablet Take 1,000 mg by  mouth every 6 (six) hours as needed for moderate pain or headache.    Marland Kitchen acetaZOLAMIDE (DIAMOX) 250 MG tablet Take 1 tablet (250 mg total)  by mouth 3 (three) times daily. 90 tablet 2  . albuterol (PROVENTIL) (2.5 MG/3ML) 0.083% nebulizer solution Take 3 mLs (2.5 mg total) by nebulization every 6 (six) hours as needed for wheezing or shortness of breath. 75 mL 0  . albuterol (VENTOLIN HFA) 108 (90 Base) MCG/ACT inhaler Inhale 2 puffs into the lungs every 4 (four) hours as needed for wheezing or shortness of breath. 6.7 g 1  . atropine 1 % ophthalmic solution Place 1 drop into the right eye in the morning and at bedtime.    . bacitracin-polymyxin b (POLYSPORIN) ophthalmic ointment Place 1 application into the right eye at bedtime. apply to right eye as needed and at bedtime 3.5 g 4  . brimonidine (ALPHAGAN P) 0.1 % SOLN Place 1 drop into both eyes 3 (three) times daily. 15 mL 3  . diphenhydrAMINE (BENADRYL) 25 MG tablet Take 25 mg by mouth every 6 (six) hours as needed for itching or allergies.    Marland Kitchen dorzolamide-timolol (COSOPT) 22.3-6.8 MG/ML ophthalmic solution Place 1 drop into the right eye 3 (three) times daily. 10 mL 4  . letrozole (FEMARA) 2.5 MG tablet Take 1 tablet (2.5 mg total) by mouth daily. 90 tablet 3  . levothyroxine (SYNTHROID, LEVOTHROID) 125 MCG tablet Take 125 mcg by mouth daily before breakfast.  3  . loratadine (CLARITIN) 10 MG tablet Take 10 mg by mouth daily.    . montelukast (SINGULAIR) 10 MG tablet Take 10 mg by mouth at bedtime.    . prednisoLONE acetate (PRED FORTE) 1 % ophthalmic suspension Place 1 drop into the right eye 3 (three) times daily. 15 mL 0  . triamcinolone (NASACORT AQ) 55 MCG/ACT AERO nasal inhaler Place 1 spray into the nose daily as needed (allergies).     Marland Kitchen albuterol (PROAIR HFA) 108 (90 Base) MCG/ACT inhaler Inhale 2 puffs into the lungs every 4 (four) hours as needed for wheezing or shortness of breath. 18 g 1  . albuterol (PROVENTIL) (2.5 MG/3ML) 0.083%  nebulizer solution Take 3 mLs (2.5 mg total) by nebulization every 6 (six) hours as needed for wheezing or shortness of breath. 75 mL 1  . azelastine (ASTELIN) 0.1 % nasal spray 1-2 sprays each nostril 2 times daily as needed 30 mL 5  . budesonide-formoterol (SYMBICORT) 160-4.5 MCG/ACT inhaler Inhale 2 puffs into the lungs 2 (two) times daily. 1 Inhaler 5   No current facility-administered medications for this visit.    Known medication allergies: Allergies  Allergen Reactions  . Other Other (See Comments)    NO BLOOD PRODUCTS - PT IN AGREEMENT FOR ALBUMIN OR ALBUMIN CONTAINING PRODUCTS PER CONSENT  . Aspirin Other (See Comments)    Stomach ulcers   . Ibuprofen Other (See Comments)    Cautious by MD due to kidney function     I appreciate the opportunity to take part in [place]'s care. Please do not hesitate to contact me with questions.  Sincerely,   R. Edgar Frisk, MD

## 2019-06-23 NOTE — Assessment & Plan Note (Signed)
The patient's history suggests seasonal allergic rhinitis.  However, we were unable to skin test today due to recent administration of antihistamine.  A prescription has been provided for azelastine nasal spray, 1-2 sprays per nostril 2 times daily as needed. Proper nasal spray technique has been discussed and demonstrated.   Hold Nasacort for now.  Nasal saline spray (i.e., Simply Saline) or nasal saline lavage (i.e., NeilMed) is recommended as needed and prior to medicated nasal sprays.  Continue loratadine for now.  The patient is scheduled to return in the near future for allergy skin testing after having been off of antihistamines for at least 3 days.  Further recommendations will be made at that time based upon skin test results.

## 2019-06-24 NOTE — Progress Notes (Addendum)
[facility] Note  06/30/2019     CHIEF COMPLAINT Patient presents for Post-op Follow-up   HISTORY OF PRESENT ILLNESS: Angela [name] is a 64 y.o. female who presents to the clinic today for:   HPI    Post-op Follow-up    In right eye.  Discomfort includes none.  Negative for pain, itching, foreign body sensation, tearing, discharge and floaters.  Vision is stable.  I, the attending physician,  performed the HPI with the patient and updated documentation appropriately.          Comments    Pt here for 1 week POV/IOP check, pt states vision is the same, she states it is cloudy when she wake up in the morning and when she goes to bed at night, but it's more clear during the day, she states the gas bubble is gone, she is still using her drops as directed       Last edited by Bernarda Caffey, MD on 07/02/2019 11:46 PM. (History)    pt states she is doing pretty well, she states she has to go back to work on June 9 bc she is out of FMLA, she states the gas bubble, she states reducing the diamox seemed to help with the exhaustion she was feeling, she has an appt with Dr. Shirley Muscat on June 8   Referring physician: Calton Dach, MD [address],  [place] [address]  HISTORICAL INFORMATION:   Selected notes from the MEDICAL RECORD NUMBER Referred by Dr. Shirley Muscat for mac hole OD   CURRENT MEDICATIONS: Current Outpatient Medications (Ophthalmic Drugs)  Medication Sig  . atropine 1 % ophthalmic solution Place 1 drop into the right eye in the morning and at bedtime.  . bacitracin-polymyxin b (POLYSPORIN) ophthalmic ointment Place 1 application into the right eye at bedtime. apply to right eye as needed and at bedtime  . brimonidine (ALPHAGAN P) 0.1 % SOLN Place 1 drop into both eyes 3 (three) times daily.  . dorzolamide-timolol (COSOPT) 22.3-6.8 MG/ML ophthalmic solution Place 1 drop into the right eye 3 (three) times daily.  . prednisoLONE  acetate (PRED FORTE) 1 % ophthalmic suspension Place 1 drop into the right eye 3 (three) times daily.   No current facility-administered medications for this visit. (Ophthalmic Drugs)   Current Outpatient Medications (Other)  Medication Sig  . acetaminophen (TYLENOL) 500 MG tablet Take 1,000 mg by mouth every 6 (six) hours as needed for moderate pain or headache.  Marland Kitchen acetaZOLAMIDE (DIAMOX) 250 MG tablet Take 1 tablet (250 mg total) by mouth 3 (three) times daily.  Marland Kitchen albuterol (PROAIR HFA) 108 (90 Base) MCG/ACT inhaler Inhale 2 puffs into the lungs every 4 (four) hours as needed for wheezing or shortness of breath.  Marland Kitchen albuterol (PROVENTIL) (2.5 MG/3ML) 0.083% nebulizer solution Take 3 mLs (2.5 mg total) by nebulization every 6 (six) hours as needed for wheezing or shortness of breath.  Marland Kitchen albuterol (PROVENTIL) (2.5 MG/3ML) 0.083% nebulizer solution Take 3 mLs (2.5 mg total) by nebulization every 6 (six) hours as needed for wheezing or shortness of breath.  Marland Kitchen albuterol (VENTOLIN HFA) 108 (90 Base) MCG/ACT inhaler Inhale 2 puffs into the lungs every 4 (four) hours as needed for wheezing or shortness of breath.  Marland Kitchen azelastine (ASTELIN) 0.1 % nasal spray 1-2 sprays each nostril 2 times daily as needed  . budesonide-formoterol (SYMBICORT) 160-4.5 MCG/ACT inhaler Inhale 2 puffs into the lungs 2 (two) times daily.  . diphenhydrAMINE (BENADRYL)  25 MG tablet Take 25 mg by mouth every 6 (six) hours as needed for itching or allergies.  Marland Kitchen letrozole (FEMARA) 2.5 MG tablet Take 1 tablet (2.5 mg total) by mouth daily.  Marland Kitchen levothyroxine (SYNTHROID, LEVOTHROID) 125 MCG tablet Take 125 mcg by mouth daily before breakfast.  . loratadine (CLARITIN) 10 MG tablet Take 10 mg by mouth daily.  . montelukast (SINGULAIR) 10 MG tablet Take 10 mg by mouth at bedtime.  . triamcinolone (NASACORT AQ) 55 MCG/ACT AERO nasal inhaler Place 1 spray into the nose daily as needed (allergies).    No current facility-administered  medications for this visit. (Other)      REVIEW OF SYSTEMS:    ALLERGIES Allergies  Allergen Reactions  . Other Other (See Comments)    NO BLOOD PRODUCTS - PT IN AGREEMENT FOR ALBUMIN OR ALBUMIN CONTAINING PRODUCTS PER CONSENT  . Aspirin Other (See Comments)    Stomach ulcers   . Ibuprofen Other (See Comments)    Cautious by MD due to kidney function     PAST MEDICAL HISTORY Past Medical History:  Diagnosis Date  . Anxiety   . Asthma   . Asthma due to seasonal allergies   . Breast cancer ([place]) 07/2017   Left Breast Cancer  . Breast discharge   . Cataract    Mixed form OU  . Congestion of right ear   . Depression   . GERD (gastroesophageal reflux disease)   . Headache(784.0)   . Hyperlipidemia   . Hypothyroidism   . Seizures ([place])    hx of during childhood -not since age 71  . Sleep apnea    uses CPAP  . Thyroid disease   . Tinnitus   . Urticaria    Past Surgical History:  Procedure Laterality Date  . [place] VITRECTOMY WITH 20 GAUGE MVR PORT FOR MACULAR HOLE Right 04/29/2019   Procedure: 25 GAUGE PARS PLANA VITRECTOMY WITH 20 GAUGE MVR PORT FOR MACULAR HOLE;  Surgeon: Bernarda Caffey, MD;  Location: [facility];  Service: Ophthalmology;  Laterality: Right;  . ANTERIOR AND POSTERIOR REPAIR  2007  . BREAST DUCTAL SYSTEM EXCISION  01/18/2011   Procedure: EXCISION DUCTAL SYSTEM BREAST;  Surgeon: Belva Crome, MD;  Location: [facility];  Service: General;  Laterality: Right;  . BREAST EXCISIONAL BIOPSY Right   . BREAST RECONSTRUCTION WITH PLACEMENT OF TISSUE EXPANDER AND ALLODERM Left 01/06/2018   Procedure: BREAST RECONSTRUCTION WITH PLACEMENT OF TISSUE EXPANDER AND ALLODERM;  Surgeon: Irene Limbo, MD;  Location: [facility];  Service: Plastics;  Laterality: Left;  . BREAST SURGERY  01/18/2011   Right BX  . CYSTOSCOPY WITH URETHROLYSIS N/A 11/01/2014   Procedure: CYSTOSCOPY WITH URETHROLYSIS;  Surgeon: Bjorn Loser, MD;   Location: [facility];  Service: Urology;  Laterality: N/A;  . DILATION AND CURETTAGE OF UTERUS    . MASTECTOMY Left 12/2017  . PUBOVAGINAL SLING N/A 11/01/2014   Procedure: REMOVAL VAGINAL SLING AND URETHROLYSIS AND CYSTO;  Surgeon: Bjorn Loser, MD;  Location: [facility];  Service: Urology;  Laterality: N/A;  . REMOVAL OF TISSUE EXPANDER AND PLACEMENT OF IMPLANT Left 07/14/2018   Procedure: REMOVAL OF  LEFT TISSUE EXPANDER AND PLACEMENT OF IMPLANT;  Surgeon: Irene Limbo, MD;  Location: [facility];  Service: Plastics;  Laterality: Left;  . TUBAL LIGATION  1998  . TYMPANOSTOMY TUBE PLACEMENT  2010   right ear   . WISDOM TOOTH EXTRACTION  FAMILY HISTORY Family History  Problem Relation Age of Onset  . Cancer Father        throat  . Cancer Maternal Aunt        breast  . Breast cancer Maternal Aunt        in 1's  . Allergic rhinitis Neg Hx   . Asthma Neg Hx   . Eczema Neg Hx   . Immunodeficiency Neg Hx   . Urticaria Neg Hx     SOCIAL HISTORY Social History   Tobacco Use  . Smoking status: Never Smoker  . Smokeless tobacco: Never Used  Substance Use Topics  . Alcohol use: No  . Drug use: No         OPHTHALMIC EXAM:  Base Eye Exam    Visual Acuity (Snellen - Linear)      Right Left   Dist cc 20/100 -1 20/20 -2   Dist ph cc 20/70 -1    Correction: Glasses       Tonometry (Tonopen, 1:13 PM)      Right Left   Pressure 22 17       Tonometry #2 (Tonopen, 1:13 PM)      Right Left   Pressure 22        Pupils      Dark Light Shape React APD   Right 4 3 Round Slow +1   Left 3 2 Round Brisk None       Visual Fields (Counting fingers)      Left Right    Full    Restrictions  Partial outer superior temporal deficiency       Extraocular Movement      Right Left    Full, Ortho Full, Ortho       Neuro/Psych    Oriented x3: Yes   Mood/Affect: Normal       Dilation    Right eye: 1.0% Mydriacyl, 2.5% Phenylephrine @ 1:13 PM         Slit Lamp and Fundus Exam    Slit Lamp Exam      Right Left   Lids/Lashes Dermatochalasis - upper lid, mild Meibomian gland dysfunction Dermatochalasis - upper lid, mild Meibomian gland dysfunction   Conjunctiva/Sclera White and quiet White and quiet   Cornea Arcus, trace Punctate epithelial erosions, mild endopigment Arcus, trace Punctate epithelial erosions   Anterior Chamber Deep, 1-2+cell/pigment Deep and quiet   Iris Round and dilated Round and dilated   Lens 2-3+ Nuclear sclerosis, 2+ Cortical cataract, mild pigment on anterior capsule - improved 2+ Nuclear sclerosis, 2+ Cortical cataract   Vitreous post vitrectomy, gas bubble gone, mild pigment in anterior vit Vitreous syneresis, vitreous condensations       Fundus Exam      Right Left   Disc Pink and Sharp, +cupping, thin inferior rim Pink and Sharp   C/D Ratio 0.85 0.5   Macula Flat, mac hole closed, Blunted foveal reflex, No heme or edema Flat, Blunted foveal reflex, Retinal pigment epithelial mottling, No heme or edema   Vessels mild Vascular attenuation, mild Tortuous mild Vascular attenuation   Periphery Hazy view improved, attached, good 360 peripheral laser changes in place, No heme   Attached             IMAGING AND PROCEDURES  Imaging and Procedures for _0 @  OCT, Retina - OU - Both Eyes       Right Eye Quality was good. Central Foveal Thickness: 266. Progression has been stable. Findings include  abnormal foveal contour, no IRF, no SRF (Mac hole closed).   Left Eye Quality was good. Central Foveal Thickness: 258. Progression has been stable. Findings include normal foveal contour, no IRF, no SRF (Partial PVD).   Notes *Images captured and stored on drive  Diagnosis / Impression:  OD: Mac hole closed; no IRF/SRF OS: NFP, no IRF/SRF; partial PVD  Clinical management:  See below  Abbreviations: NFP - Normal foveal profile. CME - cystoid macular edema. PED - pigment epithelial detachment. IRF -  intraretinal fluid. SRF - subretinal fluid. EZ - ellipsoid zone. ERM - epiretinal membrane. ORA - outer retinal atrophy. ORT - outer retinal tubulation. SRHM - subretinal hyper-reflective material                 ASSESSMENT/PLAN:    ICD-10-CM   1. Macular hole of right eye  H35.341   2. Vitreomacular adhesion of right eye  H43.821   3. Retinal edema  H35.81 OCT, Retina - OU - Both Eyes  4. Combined forms of age-related cataract of both eyes  H25.813     1-3. Macular hole with CME and residual vitreous traction OD  - +distortion and decreased vision OD  - pre op BCVA 20/70  - OCT showed macular hole w/ persistent CME and VMT  - s/p PPV/TissueBlue Stain/MP/14% C3F8 OS, 04.01.21             - mac hole closed and retina attached w/ good peripheral laser  - presented acutely (05.12.21) for 1 wk history of eye pain/pressure and decreased vision -- IOP was 50+, BCVA was HM, and cornea had MCE +             - today, IOP 22 -- improved with the addition of diamox 250 mg TID and latanoprost qhs OU  - resolved microcystic edema and bullae; +rAPD OD -- steroid response/IOP spike?  - pt reports completing a course of po prednisone due to asthma exacerbation just prior to IOP spike  - gas bubble now gone  - cont PF BID OD             - cont Brimonidine TID OD                         Cosopt TID OD  - cont latanoprost OU QHS -- pt is using Rocklatan per Dr. Shirley Muscat  - cont po Diamox 28m BID   - pt is scheduled to see Dr. BShirley Muscatnext week  - f/u week of June 16, POV  4. Mixed form age-related cataracts OU  - The symptoms of cataract, surgical options, and treatments and risks were discussed with patient.  - discussed diagnosis and progression  - not yet visually significant  - discussed likelihood of cataract progression OD post PPV  - monitor for now   Ophthalmic Meds Ordered this visit:  No orders of the defined types were placed in this encounter.     Return for f/u week  of June 16, POV/IOP.  There are no Patient Instructions on file for this visit.  This document serves as a record of services personally performed by BGardiner Sleeper MD, PhD. It was created on their behalf by AErnest Mallick OA, an ophthalmic assistant. The creation of this record is the provider's dictation and/or activities during the visit.    Electronically signed by: AErnest Mallick OA 06.02.2021 11:52 PM  BGardiner Sleeper M.D., Ph.D. Diseases & Surgery of the Retina  and Vitreous [facility] 06/30/2019   I have reviewed the above documentation for accuracy and completeness, and I agree with the above. Gardiner Sleeper, M.D., Ph.D. 07/02/19 11:52 PM    Abbreviations: M myopia (nearsighted); A astigmatism; H hyperopia (farsighted); P presbyopia; Mrx spectacle prescription;  CTL contact lenses; OD right eye; OS left eye; OU both eyes  XT exotropia; ET esotropia; PEK punctate epithelial keratitis; PEE punctate epithelial erosions; DES dry eye syndrome; MGD meibomian gland dysfunction; ATs artificial tears; PFAT's preservative free artificial tears; [place] nuclear sclerotic cataract; PSC posterior subcapsular cataract; ERM epi-retinal membrane; PVD posterior vitreous detachment; RD retinal detachment; DM diabetes mellitus; DR diabetic retinopathy; NPDR non-proliferative diabetic retinopathy; PDR proliferative diabetic retinopathy; CSME clinically significant macular edema; DME diabetic macular edema; dbh dot blot hemorrhages; CWS cotton wool spot; POAG primary open angle glaucoma; C/D cup-to-disc ratio; HVF humphrey visual field; GVF goldmann visual field; OCT optical coherence tomography; IOP intraocular pressure; BRVO Branch retinal vein occlusion; CRVO central retinal vein occlusion; CRAO central retinal artery occlusion; BRAO branch retinal artery occlusion; RT retinal tear; SB scleral buckle; PPV pars plana vitrectomy; VH Vitreous hemorrhage; PRP panretinal laser  photocoagulation; IVK intravitreal kenalog; VMT vitreomacular traction; MH Macular hole;  NVD neovascularization of the disc; NVE neovascularization elsewhere; AREDS age related eye disease study; ARMD age related macular degeneration; POAG primary open angle glaucoma; EBMD epithelial/anterior basement membrane dystrophy; ACIOL anterior chamber intraocular lens; IOL intraocular lens; PCIOL posterior chamber intraocular lens; Phaco/IOL phacoemulsification with intraocular lens placement; [place] photorefractive keratectomy; LASIK laser assisted in situ keratomileusis; HTN hypertension; DM diabetes mellitus; COPD chronic obstructive pulmonary disease

## 2019-06-30 ENCOUNTER — Other Ambulatory Visit: Payer: Self-pay

## 2019-06-30 ENCOUNTER — Ambulatory Visit (INDEPENDENT_AMBULATORY_CARE_PROVIDER_SITE_OTHER): Payer: 59 | Admitting: Ophthalmology

## 2019-06-30 DIAGNOSIS — H25813 Combined forms of age-related cataract, bilateral: Secondary | ICD-10-CM

## 2019-06-30 DIAGNOSIS — H43821 Vitreomacular adhesion, right eye: Secondary | ICD-10-CM

## 2019-06-30 DIAGNOSIS — H35341 Macular cyst, hole, or pseudohole, right eye: Secondary | ICD-10-CM

## 2019-06-30 DIAGNOSIS — H3581 Retinal edema: Secondary | ICD-10-CM

## 2019-07-02 ENCOUNTER — Encounter (INDEPENDENT_AMBULATORY_CARE_PROVIDER_SITE_OTHER): Payer: Self-pay | Admitting: Ophthalmology

## 2019-07-02 ENCOUNTER — Other Ambulatory Visit (INDEPENDENT_AMBULATORY_CARE_PROVIDER_SITE_OTHER): Payer: Self-pay | Admitting: Ophthalmology

## 2019-07-13 NOTE — Progress Notes (Signed)
[facility] Note  07/14/2019     CHIEF COMPLAINT Patient presents for Post-op Follow-up   HISTORY OF PRESENT ILLNESS: Angela [name] is a 64 y.o. female who presents to the clinic today for:   HPI    Post-op Follow-up    In right eye.  Discomfort includes itching.  Negative for pain, foreign body sensation, tearing, discharge, floaters and none.  Vision is improved.  I, the attending physician,  performed the HPI with the patient and updated documentation appropriately.          Comments    Patient states vision has been improving OD, but yesterday patient went outside and vision was blurred after being outside. Patient also reports some itching. Vision has improved some today, but still a little blur. Using brimonidine tid OD, cosopt tid OD,and rocklatan qhs OU. IOP was 22 mmHg OD on 06.07.21 with Dr. Shirley Muscat.       Last edited by Bernarda Caffey, MD on 07/14/2019 12:53 PM. (History)    Patient states IOP was 22 mmHg with Dr. Shirley Muscat on 06.07.21. Patient went outside yesterday, and vision blurred OD after being outside. Still a little blurred today, but some improvement from yesterday.    Referring physician: Alroy Dust, L.[name], [place] [address] [place],  [place] [address]  HISTORICAL INFORMATION:   Selected notes from the MEDICAL RECORD NUMBER Referred by Dr. Shirley Muscat for mac hole OD   CURRENT MEDICATIONS: Current Outpatient Medications (Ophthalmic Drugs)  Medication Sig  . brimonidine (ALPHAGAN P) 0.1 % SOLN Place 1 drop into both eyes 3 (three) times daily.  . dorzolamide-timolol (COSOPT) 22.3-6.8 MG/ML ophthalmic solution Place 1 drop into the right eye 3 (three) times daily.  . prednisoLONE acetate (PRED FORTE) 1 % ophthalmic suspension Place 1 drop into the right eye 3 (three) times daily.  Marland Kitchen atropine 1 % ophthalmic solution Place 1 drop into the right eye in the morning and at bedtime. (Patient not taking: Reported on  07/14/2019)  . bacitracin-polymyxin b (POLYSPORIN) ophthalmic ointment Place 1 application into the right eye at bedtime. apply to right eye as needed and at bedtime (Patient not taking: Reported on 07/14/2019)  . LOTEMAX SM 0.38 % GEL Place 1 drop into the right eye 2 (two) times daily.   No current facility-administered medications for this visit. (Ophthalmic Drugs)   Current Outpatient Medications (Other)  Medication Sig  . acetaminophen (TYLENOL) 500 MG tablet Take 1,000 mg by mouth every 6 (six) hours as needed for moderate pain or headache.  Marland Kitchen acetaZOLAMIDE (DIAMOX) 250 MG tablet Take 1 tablet (250 mg total) by mouth 3 (three) times daily.  Marland Kitchen albuterol (PROAIR HFA) 108 (90 Base) MCG/ACT inhaler Inhale 2 puffs into the lungs every 4 (four) hours as needed for wheezing or shortness of breath.  Marland Kitchen albuterol (PROVENTIL) (2.5 MG/3ML) 0.083% nebulizer solution Take 3 mLs (2.5 mg total) by nebulization every 6 (six) hours as needed for wheezing or shortness of breath.  Marland Kitchen albuterol (PROVENTIL) (2.5 MG/3ML) 0.083% nebulizer solution Take 3 mLs (2.5 mg total) by nebulization every 6 (six) hours as needed for wheezing or shortness of breath.  Marland Kitchen albuterol (VENTOLIN HFA) 108 (90 Base) MCG/ACT inhaler Inhale 2 puffs into the lungs every 4 (four) hours as needed for wheezing or shortness of breath.  Marland Kitchen azelastine (ASTELIN) 0.1 % nasal spray 1-2 sprays each nostril 2 times daily as needed  . budesonide-formoterol (SYMBICORT) 160-4.5 MCG/ACT inhaler Inhale 2 puffs into  the lungs 2 (two) times daily.  . diphenhydrAMINE (BENADRYL) 25 MG tablet Take 25 mg by mouth every 6 (six) hours as needed for itching or allergies.  Marland Kitchen letrozole (FEMARA) 2.5 MG tablet Take 1 tablet (2.5 mg total) by mouth daily.  Marland Kitchen levothyroxine (SYNTHROID, LEVOTHROID) 125 MCG tablet Take 125 mcg by mouth daily before breakfast.  . loratadine (CLARITIN) 10 MG tablet Take 10 mg by mouth daily.  . montelukast (SINGULAIR) 10 MG tablet Take 10 mg  by mouth at bedtime.  . triamcinolone (NASACORT AQ) 55 MCG/ACT AERO nasal inhaler Place 1 spray into the nose daily as needed (allergies).    No current facility-administered medications for this visit. (Other)      REVIEW OF SYSTEMS: ROS    Positive for: Gastrointestinal, Eyes, Respiratory   Negative for: Constitutional, Neurological, Skin, Genitourinary, Musculoskeletal, HENT, Endocrine, Cardiovascular, Psychiatric, Allergic/Imm, Heme/Lymph   Last edited by Roselee Nova D, COT on 07/14/2019  8:54 AM. (History)       ALLERGIES Allergies  Allergen Reactions  . Other Other (See Comments)    NO BLOOD PRODUCTS - PT IN AGREEMENT FOR ALBUMIN OR ALBUMIN CONTAINING PRODUCTS PER CONSENT  . Aspirin Other (See Comments)    Stomach ulcers   . Ibuprofen Other (See Comments)    Cautious by MD due to kidney function     PAST MEDICAL HISTORY Past Medical History:  Diagnosis Date  . Anxiety   . Asthma   . Asthma due to seasonal allergies   . Breast cancer ([place]) 07/2017   Left Breast Cancer  . Breast discharge   . Cataract    Mixed form OU  . Congestion of right ear   . Depression   . GERD (gastroesophageal reflux disease)   . Headache(784.0)   . Hyperlipidemia   . Hypothyroidism   . Seizures ([place])    hx of during childhood -not since age 51  . Sleep apnea    uses CPAP  . Thyroid disease   . Tinnitus   . Urticaria    Past Surgical History:  Procedure Laterality Date  . [place] VITRECTOMY WITH 20 GAUGE MVR PORT FOR MACULAR HOLE Right 04/29/2019   Procedure: 25 GAUGE PARS PLANA VITRECTOMY WITH 20 GAUGE MVR PORT FOR MACULAR HOLE;  Surgeon: Bernarda Caffey, MD;  Location: [facility];  Service: Ophthalmology;  Laterality: Right;  . ANTERIOR AND POSTERIOR REPAIR  2007  . BREAST DUCTAL SYSTEM EXCISION  01/18/2011   Procedure: EXCISION DUCTAL SYSTEM BREAST;  Surgeon: Belva Crome, MD;  Location: [facility];  Service: General;  Laterality: Right;  . BREAST  EXCISIONAL BIOPSY Right   . BREAST RECONSTRUCTION WITH PLACEMENT OF TISSUE EXPANDER AND ALLODERM Left 01/06/2018   Procedure: BREAST RECONSTRUCTION WITH PLACEMENT OF TISSUE EXPANDER AND ALLODERM;  Surgeon: Irene Limbo, MD;  Location: [facility];  Service: Plastics;  Laterality: Left;  . BREAST SURGERY  01/18/2011   Right BX  . CYSTOSCOPY WITH URETHROLYSIS N/A 11/01/2014   Procedure: CYSTOSCOPY WITH URETHROLYSIS;  Surgeon: Bjorn Loser, MD;  Location: [facility];  Service: Urology;  Laterality: N/A;  . DILATION AND CURETTAGE OF UTERUS    . MASTECTOMY Left 12/2017  . PUBOVAGINAL SLING N/A 11/01/2014   Procedure: REMOVAL VAGINAL SLING AND URETHROLYSIS AND CYSTO;  Surgeon: Bjorn Loser, MD;  Location: [facility];  Service: Urology;  Laterality: N/A;  . REMOVAL OF TISSUE EXPANDER AND PLACEMENT OF IMPLANT Left 07/14/2018   Procedure: REMOVAL OF  LEFT TISSUE EXPANDER AND PLACEMENT OF IMPLANT;  Surgeon: Irene Limbo, MD;  Location: [facility];  Service: Plastics;  Laterality: Left;  . TUBAL LIGATION  1998  . TYMPANOSTOMY TUBE PLACEMENT  2010   right ear   . WISDOM TOOTH EXTRACTION      FAMILY HISTORY Family History  Problem Relation Age of Onset  . Cancer Father        throat  . Cancer Maternal Aunt        breast  . Breast cancer Maternal Aunt        in 19's  . Allergic rhinitis Neg Hx   . Asthma Neg Hx   . Eczema Neg Hx   . Immunodeficiency Neg Hx   . Urticaria Neg Hx     SOCIAL HISTORY Social History   Tobacco Use  . Smoking status: Never Smoker  . Smokeless tobacco: Never Used  Vaping Use  . Vaping Use: Never used  Substance Use Topics  . Alcohol use: No  . Drug use: No         OPHTHALMIC EXAM:  Base Eye Exam    Visual Acuity (Snellen - Linear)      Right Left   Dist cc 20/100 -3 20/25   Dist ph cc 20/80 +1 20/25 +1       Tonometry (Tonopen, 9:00 AM)      Right Left   Pressure 21 18       Pupils      Dark Light Shape  React APD   Right 4 3.5 Round Minimal None   Left 3 2 Round Brisk None       Visual Fields (Counting fingers)      Left Right    Full    Restrictions  Partial outer superior temporal deficiency       Extraocular Movement      Right Left    Full, Ortho Full, Ortho       Neuro/Psych    Oriented x3: Yes   Mood/Affect: Normal       Dilation    Both eyes: 1.0% Mydriacyl, 2.5% Phenylephrine @ 9:00 AM        Slit Lamp and Fundus Exam    Slit Lamp Exam      Right Left   Lids/Lashes Dermatochalasis - upper lid, mild Meibomian gland dysfunction Dermatochalasis - upper lid, mild Meibomian gland dysfunction   Conjunctiva/Sclera White and quiet White and quiet   Cornea Arcus, 1-2+ Punctate epithelial erosions, trace endopigment Arcus, trace Punctate epithelial erosions   Anterior Chamber Deep, 1-2+cell/pigment Deep and quiet   Iris Round and dilated Round and dilated   Lens 2-3+ Nuclear sclerosis, 2+ Cortical cataract, mild pigment on anterior capsule - improved 2+ Nuclear sclerosis, 2+ Cortical cataract   Vitreous post vitrectomy, gas bubble gone, mild pigment in anterior vit Vitreous syneresis, vitreous condensations       Fundus Exam      Right Left   Disc mild pallor, +cupping, thin inferior rim Pink and Sharp   C/D Ratio 0.85 0.5   Macula Flat, mac hole closed, Blunted foveal reflex, No heme or edema Flat, Blunted foveal reflex, Retinal pigment epithelial mottling, No heme or edema   Vessels mild Vascular attenuation, mild Tortuous, +A/V crossing changes mild Vascular attenuation   Periphery  attached, good 360 peripheral laser changes in place, No heme   Attached           Refraction    Wearing Rx  Sphere Cylinder Axis   Right -6.50 +1.00 107   Left -6.75 +1.25 060   Type: progressive       Manifest Refraction      Sphere Cylinder Axis Dist VA   Right -6.50 +1.00 105 NI   Left      Attempted +/-1.00 over-refraction          IMAGING AND PROCEDURES   Imaging and Procedures for _0 @  OCT, Retina - OU - Both Eyes       Right Eye Quality was good. Central Foveal Thickness: 261. Progression has been stable. Findings include abnormal foveal contour, no IRF, no SRF (Mac hole closed).   Left Eye Quality was good. Central Foveal Thickness: 253. Progression has been stable. Findings include normal foveal contour, no IRF, no SRF (Partial PVD).   Notes *Images captured and stored on drive  Diagnosis / Impression:  OD: Mac hole closed; no IRF/SRF OS: NFP, no IRF/SRF; partial PVD  Clinical management:  See below  Abbreviations: NFP - Normal foveal profile. CME - cystoid macular edema. PED - pigment epithelial detachment. IRF - intraretinal fluid. SRF - subretinal fluid. EZ - ellipsoid zone. ERM - epiretinal membrane. ORA - outer retinal atrophy. ORT - outer retinal tubulation. SRHM - subretinal hyper-reflective material                 ASSESSMENT/PLAN:    ICD-10-CM   1. Macular hole of right eye  H35.341 LOTEMAX SM 0.38 % GEL  2. Vitreomacular adhesion of right eye  H43.821   3. Retinal edema  H35.81 OCT, Retina - OU - Both Eyes  4. Combined forms of age-related cataract of both eyes  H25.813     1-3. Macular hole with CME and residual vitreous traction OD  - +distortion and decreased vision OD  - pre op BCVA 20/70  - OCT showed macular hole w/ persistent CME and VMT  - s/p PPV/TissueBlue Stain/MP/14% C3F8 OS, 04.01.21             - mac hole closed and retina attached w/ good peripheral laser  - presented acutely (05.12.21) for 1 wk history of eye pain/pressure and decreased vision -- IOP was 50+, BCVA was HM, and cornea had MCE +             - today, IOP 21 -- improved with the addition of diamox 250 mg TID and latanoprost qhs OU  - resolved microcystic edema and bullae; +rAPD OD -- steroid response/IOP spike?  - pt reports completing a course of po prednisone due to asthma exacerbation just prior to IOP spike  - pt  referred to Dr. Midge Aver for IOP and cataract management by Dr. Shirley Muscat -- pt scheduled to see him in July, but pt to try to get appt moved up  - gas bubble now gone  - D/C PF and change to lotemax SM bid OD to limit steroid response (pt has also started symbicort inhaler)             - cont Brimonidine TID OD                         Cosopt TID OD  - cont latanoprost OU QHS -- pt is using Rocklatan per Dr. Shirley Muscat  - cont po Diamox 264m BID   - pt is scheduled to see Dr. CMidge Averin July, but will try to have appointment moved up  - f/u 2-3 weeks  4. Mixed form age-related cataracts OU  - The symptoms of cataract, surgical options, and treatments and risks were discussed with patient.  - discussed diagnosis and progression  - discussed likelihood of cataract progression OD post PPV  - scheduled for cat eval w/ Dr. Shirleen Schirmer   Ophthalmic Meds Ordered this visit:  Meds ordered this encounter  Medications  . LOTEMAX SM 0.38 % GEL    Sig: Place 1 drop into the right eye 2 (two) times daily.    Dispense:  5 g    Refill:  3      Return 2-3 weeks, for DFE, OCT.  There are no Patient Instructions on file for this visit.   This document serves as a record of services personally performed by Gardiner Sleeper, MD, PhD. It was created on their behalf by Roselee Nova, COMT. The creation of this record is the provider's dictation and/or activities during the visit.  Electronically signed by: Roselee Nova, COMT 07/14/19 12:57 PM  Gardiner Sleeper, M.D., Ph.D. Diseases & Surgery of the Retina and [place] 07/14/2019   I have reviewed the above documentation for accuracy and completeness, and I agree with the above. Gardiner Sleeper, M.D., Ph.D. 07/14/19 12:57 PM      Abbreviations: M myopia (nearsighted); A astigmatism; H hyperopia (farsighted); P presbyopia; Mrx spectacle prescription;  CTL contact lenses; OD right eye; OS left eye; OU both  eyes  XT exotropia; ET esotropia; PEK punctate epithelial keratitis; PEE punctate epithelial erosions; DES dry eye syndrome; MGD meibomian gland dysfunction; ATs artificial tears; PFAT's preservative free artificial tears; [place] nuclear sclerotic cataract; PSC posterior subcapsular cataract; ERM epi-retinal membrane; PVD posterior vitreous detachment; RD retinal detachment; DM diabetes mellitus; DR diabetic retinopathy; NPDR non-proliferative diabetic retinopathy; PDR proliferative diabetic retinopathy; CSME clinically significant macular edema; DME diabetic macular edema; dbh dot blot hemorrhages; CWS cotton wool spot; POAG primary open angle glaucoma; C/D cup-to-disc ratio; HVF humphrey visual field; GVF goldmann visual field; OCT optical coherence tomography; IOP intraocular pressure; BRVO Branch retinal vein occlusion; CRVO central retinal vein occlusion; CRAO central retinal artery occlusion; BRAO branch retinal artery occlusion; RT retinal tear; SB scleral buckle; PPV pars plana vitrectomy; VH Vitreous hemorrhage; PRP panretinal laser photocoagulation; IVK intravitreal kenalog; VMT vitreomacular traction; MH Macular hole;  NVD neovascularization of the disc; NVE neovascularization elsewhere; AREDS age related eye disease study; ARMD age related macular degeneration; POAG primary open angle glaucoma; EBMD epithelial/anterior basement membrane dystrophy; ACIOL anterior chamber intraocular lens; IOL intraocular lens; PCIOL posterior chamber intraocular lens; Phaco/IOL phacoemulsification with intraocular lens placement; [place] photorefractive keratectomy; LASIK laser assisted in situ keratomileusis; HTN hypertension; DM diabetes mellitus; COPD chronic obstructive pulmonary disease

## 2019-07-14 ENCOUNTER — Other Ambulatory Visit: Payer: Self-pay

## 2019-07-14 ENCOUNTER — Ambulatory Visit (INDEPENDENT_AMBULATORY_CARE_PROVIDER_SITE_OTHER): Payer: 59 | Admitting: Ophthalmology

## 2019-07-14 ENCOUNTER — Encounter (INDEPENDENT_AMBULATORY_CARE_PROVIDER_SITE_OTHER): Payer: Self-pay | Admitting: Ophthalmology

## 2019-07-14 DIAGNOSIS — H35341 Macular cyst, hole, or pseudohole, right eye: Secondary | ICD-10-CM

## 2019-07-14 DIAGNOSIS — H43821 Vitreomacular adhesion, right eye: Secondary | ICD-10-CM

## 2019-07-14 DIAGNOSIS — H3581 Retinal edema: Secondary | ICD-10-CM | POA: Diagnosis not present

## 2019-07-14 DIAGNOSIS — H25813 Combined forms of age-related cataract, bilateral: Secondary | ICD-10-CM

## 2019-07-14 MED ORDER — LOTEMAX SM 0.38 % OP GEL: 1.0000 [drp] | Freq: Two times a day (BID) | OPHTHALMIC | 3 refills | Status: DC

## 2019-07-19 ENCOUNTER — Other Ambulatory Visit: Payer: 59

## 2019-07-26 ENCOUNTER — Ambulatory Visit: Payer: Managed Care, Other (non HMO) | Admitting: Allergy and Immunology

## 2019-08-01 ENCOUNTER — Other Ambulatory Visit: Payer: Self-pay

## 2019-08-01 ENCOUNTER — Encounter: Payer: Self-pay | Admitting: Emergency Medicine

## 2019-08-01 ENCOUNTER — Ambulatory Visit
Admission: EM | Admit: 2019-08-01 | Discharge: 2019-08-01 | Disposition: A | Discharge status: Home / Self Care | Payer: 59 | Attending: Emergency Medicine | Admitting: Emergency Medicine

## 2019-08-01 DIAGNOSIS — B349 Viral infection, unspecified: Secondary | ICD-10-CM | POA: Diagnosis not present

## 2019-08-01 DIAGNOSIS — R0981 Nasal congestion: Secondary | ICD-10-CM | POA: Diagnosis not present

## 2019-08-01 DIAGNOSIS — J302 Other seasonal allergic rhinitis: Secondary | ICD-10-CM

## 2019-08-01 MED ORDER — BENZONATATE 100 MG PO CAPS
100.0000 mg | ORAL_CAPSULE | Freq: Three times a day (TID) | ORAL | 0 refills | Status: DC
Start: 2019-08-01 — End: 2020-08-01

## 2019-08-01 NOTE — ED Triage Notes (Signed)
Pt presents to [facility] for assessment of 4 days of sinus pressure, green mucous, facial pain, and sore throat.

## 2019-08-01 NOTE — Discharge Instructions (Addendum)
Your COVID test is pending - it is important to quarantine / isolate at home until your results are back. °If you test positive and would like further evaluation for persistent or worsening symptoms, you may schedule an E-visit or virtual (video) visit throughout the [facility] MyChart app or website. ° °PLEASE NOTE: If you develop severe chest pain or shortness of breath please go to the ER or call [id number] for further evaluation --> DO NOT schedule electronic or virtual visits for this. °Please call our office for further guidance / recommendations as needed. ° °For information about the Covid vaccine, please visit [email or web address] °

## 2019-08-01 NOTE — ED Provider Notes (Signed)
[facility]    CSN: [id number] Arrival date & time: 08/01/19  1418      History   Chief Complaint Chief Complaint  Patient presents with  . URI    HPI Angela [name] is a 64 y.o. female with history of asthma, allergies, breast cancer (on hormonal therapy, no chemo/radiation), hypothyroidism, seizures in childhood presenting for 4-day course of URI.  Patient provides history: Endorsing sinus pressure, nasal congestion, facial pain, scratchy throat.  States that she can "smell garlic ".  Is fully Covid vaccinated: No known sick contacts.  No fever, chest pain, difficulty breathing, nausea, vomiting, diarrhea, change in urination.  Patient does endorse mild, dry cough.  Has not been using albuterol inhaler for this.   Past Medical History:  Diagnosis Date  . Anxiety   . Asthma   . Asthma due to seasonal allergies   . Breast cancer ([place]) 07/2017   Left Breast Cancer  . Breast discharge   . Cataract    Mixed form OU  . Congestion of right ear   . Depression   . GERD (gastroesophageal reflux disease)   . Headache(784.0)   . Hyperlipidemia   . Hypothyroidism   . Seizures ([place])    hx of during childhood -not since age 14  . Sleep apnea    uses CPAP  . Thyroid disease   . Tinnitus   . Urticaria     Patient Active Problem List   Diagnosis Date Noted  . Moderate persistent asthma 06/23/2019  . Other allergic rhinitis 06/23/2019  . Breast cancer, left ([place]) 01/06/2018  . Malignant neoplasm of lower-inner quadrant of left breast in female, estrogen receptor positive ([place]) 07/22/2017  . Elevated BP without diagnosis of hypertension   . Gastroesophageal reflux disease   . Asthma exacerbation 05/13/2016  . Anxiety 05/13/2016  . Depression 05/13/2016  . Erosion of suburethral sling ([place]) 11/01/2014  . Goiter, nodular 09/17/2012  . Hypothyroidism 09/04/2012  . Postop check 02/08/2011  . Nipple discharge 12/18/2010  . Right breast nipple inversion and discharge  11/06/2010    Past Surgical History:  Procedure Laterality Date  . [place] VITRECTOMY WITH 20 GAUGE MVR PORT FOR MACULAR HOLE Right 04/29/2019   Procedure: 25 GAUGE PARS PLANA VITRECTOMY WITH 20 GAUGE MVR PORT FOR MACULAR HOLE;  Surgeon: Bernarda Caffey, MD;  Location: [facility];  Service: Ophthalmology;  Laterality: Right;  . ANTERIOR AND POSTERIOR REPAIR  2007  . BREAST DUCTAL SYSTEM EXCISION  01/18/2011   Procedure: EXCISION DUCTAL SYSTEM BREAST;  Surgeon: Belva Crome, MD;  Location: [facility];  Service: General;  Laterality: Right;  . BREAST EXCISIONAL BIOPSY Right   . BREAST RECONSTRUCTION WITH PLACEMENT OF TISSUE EXPANDER AND ALLODERM Left 01/06/2018   Procedure: BREAST RECONSTRUCTION WITH PLACEMENT OF TISSUE EXPANDER AND ALLODERM;  Surgeon: Irene Limbo, MD;  Location: [facility];  Service: Plastics;  Laterality: Left;  . BREAST SURGERY  01/18/2011   Right BX  . CYSTOSCOPY WITH URETHROLYSIS N/A 11/01/2014   Procedure: CYSTOSCOPY WITH URETHROLYSIS;  Surgeon: Bjorn Loser, MD;  Location: [facility];  Service: Urology;  Laterality: N/A;  . DILATION AND CURETTAGE OF UTERUS    . MASTECTOMY Left 12/2017  . PUBOVAGINAL SLING N/A 11/01/2014   Procedure: REMOVAL VAGINAL SLING AND URETHROLYSIS AND CYSTO;  Surgeon: Bjorn Loser, MD;  Location: [facility];  Service: Urology;  Laterality: N/A;  . REMOVAL OF TISSUE EXPANDER AND PLACEMENT OF IMPLANT Left 07/14/2018  Procedure: REMOVAL OF  LEFT TISSUE EXPANDER AND PLACEMENT OF IMPLANT;  Surgeon: Irene Limbo, MD;  Location: [facility];  Service: Plastics;  Laterality: Left;  . TUBAL LIGATION  1998  . TYMPANOSTOMY TUBE PLACEMENT  2010   right ear   . WISDOM TOOTH EXTRACTION      OB History    Gravida  6   Para  4   Term  4   Preterm      AB  2   Living  4     SAB  2   TAB      Ectopic      Multiple      Live Births               Home Medications     Prior to Admission medications   Medication Sig Start Date End Date Taking? Authorizing Provider  acetaminophen (TYLENOL) 500 MG tablet Take 1,000 mg by mouth every 6 (six) hours as needed for moderate pain or headache.    [provider]  acetaZOLAMIDE (DIAMOX) 250 MG tablet Take 1 tablet (250 mg total) by mouth 3 (three) times daily. 06/09/19   Bernarda Caffey, MD  albuterol [facility] HFA) [phone] Base) MCG/ACT inhaler Inhale 2 puffs into the lungs every 4 (four) hours as needed for wheezing or shortness of breath. 06/23/19   Bobbitt, Sed[place] Muta, MD  albuterol (PROVENTIL) (2.5 MG/3ML) 0.083% nebulizer solution Take 3 mLs (2.5 mg total) by nebulization every 6 (six) hours as needed for wheezing or shortness of breath. 05/21/19   Little, Wenda Overland, MD  albuterol (PROVENTIL) (2.5 MG/3ML) 0.083% nebulizer solution Take 3 mLs (2.5 mg total) by nebulization every 6 (six) hours as needed for wheezing or shortness of breath. 06/23/19   Bobbitt, Sedalia Muta, MD  albuterol (VENTOLIN HFA) 108 (90 Base) MCG/ACT inhaler Inhale 2 puffs into the lungs every 4 (four) hours as needed for wheezing or shortness of breath. 05/21/19   Little, Wenda Overland, MD  atropine 1 % ophthalmic solution Place 1 drop into the right eye in the morning and at bedtime. Patient not taking: Reported on 07/14/2019    [provider]  azelastine (ASTELIN) 0.1 % nasal spray 1-2 sprays each nostril 2 times daily as needed 06/23/19   Bobbitt, Sedalia Muta, MD  bacitracin-polymyxin b (POLYSPORIN) ophthalmic ointment Place 1 application into the right eye at bedtime. apply to right eye as needed and at bedtime Patient not taking: Reported on 07/14/2019 05/07/19   Bernarda Caffey, MD  benzonatate (TESSALON) 100 MG capsule Take 1 capsule (100 mg total) by mouth every 8 (eight) hours. 08/01/19   Hall-Potvin, [place], PA-C  brimonidine (ALPHAGAN P) 0.1 % SOLN Place 1 drop into both eyes 3 (three) times daily. 05/07/19   Bernarda Caffey, MD   budesonide-formoterol [facility]) 160-4.5 MCG/ACT inhaler Inhale 2 puffs into the lungs 2 (two) times daily. 06/23/19   Bobbitt, Sedalia Muta, MD  diphenhydrAMINE (BENADRYL) 25 MG tablet Take 25 mg by mouth every 6 (six) hours as needed for itching or allergies.    [provider]  dorzolamide-timolol (COSOPT) 22.3-6.8 MG/ML ophthalmic solution Place 1 drop into the right eye 3 (three) times daily. 05/07/19   Bernarda Caffey, MD  letrozole [facility]) 2.5 MG tablet Take 1 tablet (2.5 mg total) by mouth daily. 03/08/19   Nicholas Lose, MD  levothyroxine (SYNTHROID, LEVOTHROID) 125 MCG tablet Take 125 mcg by mouth daily before breakfast. 11/21/17   [provider]  loratadine (  CLARITIN) 10 MG tablet Take 10 mg by mouth daily.    [provider]  LOTEMAX SM 0.38 % GEL Place 1 drop into the right eye 2 (two) times daily. 07/14/19   Bernarda Caffey, MD  montelukast (SINGULAIR) 10 MG tablet Take 10 mg by mouth at bedtime.    [provider]  prednisoLONE acetate (PRED FORTE) 1 % ophthalmic suspension Place 1 drop into the right eye 3 (three) times daily. 05/07/19   Bernarda Caffey, MD  triamcinolone (NASACORT AQ) 55 MCG/ACT AERO nasal inhaler Place 1 spray into the nose daily as needed (allergies).     [provider]    Family History Family History  Problem Relation Age of Onset  . Cancer Father        throat  . Cancer Maternal Aunt        breast  . Breast cancer Maternal Aunt        in 39's  . Allergic rhinitis Neg Hx   . Asthma Neg Hx   . Eczema Neg Hx   . Immunodeficiency Neg Hx   . Urticaria Neg Hx     Social History Social History   Tobacco Use  . Smoking status: Never Smoker  . Smokeless tobacco: Never Used  Vaping Use  . Vaping Use: Never used  Substance Use Topics  . Alcohol use: No  . Drug use: No     Allergies   Other, Aspirin, and Ibuprofen   Review of Systems As per HPI   Physical Exam Triage Vital Signs ED Triage Vitals  Enc  Vitals Group     BP      Pulse      Resp      Temp      Temp src      SpO2      Weight      Height      Head Circumference      Peak Flow      Pain Score      Pain Loc      Pain Edu?      Excl. in [place]?    No data found.  Updated Vital Signs BP (!) 140/97 (BP Location: Right Arm)   Pulse 94   Temp 98.3 F (36.8 C) (Oral)   Resp 18   SpO2 94%   Visual Acuity Right Eye Distance:   Left Eye Distance:   Bilateral Distance:    Right Eye Near:   Left Eye Near:    Bilateral Near:     Physical Exam Constitutional:      General: She is not in acute distress. HENT:     Head: Normocephalic and atraumatic.     Right Ear: Tympanic membrane, ear canal and external ear normal.     Left Ear: Tympanic membrane, ear canal and external ear normal.     Nose: No nasal deformity, congestion or rhinorrhea.     Comments: Turbinates edematous bilaterally with pale mucosa    Mouth/Throat:     Mouth: Mucous membranes are moist.     Tongue: Tongue does not deviate from midline.     Pharynx: Oropharynx is clear. Uvula midline.     Comments: No tonsillar hypertrophy or exudate Eyes:     General: No scleral icterus.    Conjunctiva/sclera: Conjunctivae normal.     Pupils: Pupils are equal, round, and reactive to light.  Cardiovascular:     Rate and Rhythm: Normal rate and regular rhythm.  Pulmonary:  Effort: Pulmonary effort is normal. No respiratory distress.     Breath sounds: No wheezing or rales.  Musculoskeletal:     Cervical back: Normal range of motion and neck supple. No tenderness. No muscular tenderness.  Lymphadenopathy:     Cervical: No cervical adenopathy.  Neurological:     Mental Status: She is alert.      UC Treatments / Results  Labs (all labs ordered are listed, but only abnormal results are displayed) Labs Reviewed  NOVEL CORONAVIRUS, NAA    EKG   Radiology No results found.  Procedures Procedures (including critical care time)  Medications  Ordered in UC Medications - No data to display  Initial Impression / Assessment and Plan / UC Course  I have reviewed the triage vital signs and the nursing notes.  Pertinent labs & imaging results that were available during my care of the patient were reviewed by me and considered in my medical decision making (see chart for details).     Patient afebrile, nontoxic, with SpO2 94%.  Likely viral URI.  Covid PCR pending.  Patient to quarantine until results are back.  We will treat supportively as outlined below (antihistamine, azelastine, tessalon, albuterol inhaler) as patient does not have been immunocompromised status that would increase risk of acute bacterial infection.  Return precautions discussed, patient verbalized understanding and is agreeable to plan. Final Clinical Impressions(s) / UC Diagnoses   Final diagnoses:  Nasal congestion  Viral illness  Seasonal allergies     Discharge Instructions     Your COVID test is pending - it is important to quarantine / isolate at home until your results are back. If you test positive and would like further evaluation for persistent or worsening symptoms, you may schedule an E-visit or virtual (video) visit throughout the [facility] app or website.  PLEASE NOTE: If you develop severe chest pain or shortness of breath please go to the ER or call 9-1-1 for further evaluation --> DO NOT schedule electronic or virtual visits for this. Please call our office for further guidance / recommendations as needed.  For information about the Covid vaccine, please visit [email or web address]    ED Prescriptions    Medication Sig Dispense Auth. Provider   benzonatate (TESSALON) 100 MG capsule Take 1 capsule (100 mg total) by mouth every 8 (eight) hours. 21 capsule Hall-Potvin, [place], PA-C     PDMP not reviewed this encounter.   Hall-Potvin, [place], [place] 08/01/19 1442

## 2019-08-04 LAB — NOVEL CORONAVIRUS, NAA: SARS-CoV-2, NAA: NOT DETECTED

## 2019-08-05 ENCOUNTER — Telehealth (INDEPENDENT_AMBULATORY_CARE_PROVIDER_SITE_OTHER): Payer: Self-pay

## 2019-08-06 ENCOUNTER — Encounter (INDEPENDENT_AMBULATORY_CARE_PROVIDER_SITE_OTHER): Payer: Self-pay

## 2019-08-06 ENCOUNTER — Encounter (INDEPENDENT_AMBULATORY_CARE_PROVIDER_SITE_OTHER): Payer: Self-pay | Admitting: Ophthalmology

## 2019-08-09 ENCOUNTER — Ambulatory Visit: Payer: Self-pay | Admitting: Allergy and Immunology

## 2019-09-10 ENCOUNTER — Other Ambulatory Visit: Payer: Self-pay | Admitting: Obstetrics and Gynecology

## 2019-09-10 DIAGNOSIS — Z1231 Encounter for screening mammogram for malignant neoplasm of breast: Secondary | ICD-10-CM

## 2019-09-13 ENCOUNTER — Ambulatory Visit: Payer: 59

## 2019-09-29 ENCOUNTER — Other Ambulatory Visit: Payer: Self-pay

## 2019-12-14 ENCOUNTER — Ambulatory Visit: Payer: 59

## 2020-01-04 ENCOUNTER — Ambulatory Visit
Admission: RE | Admit: 2020-01-04 | Discharge: 2020-01-04 | Disposition: A | Discharge status: Home / Self Care | Payer: 59 | Source: Ambulatory Visit | Attending: Obstetrics and Gynecology | Admitting: Obstetrics and Gynecology

## 2020-01-04 ENCOUNTER — Other Ambulatory Visit: Payer: Self-pay

## 2020-01-04 DIAGNOSIS — Z1231 Encounter for screening mammogram for malignant neoplasm of breast: Secondary | ICD-10-CM

## 2020-01-09 NOTE — Progress Notes (Signed)
Patient Care Team: Alroy Dust, L.[name], MD as PCP - General (Family Medicine) Rolm Bookbinder, MD as Consulting Physician (General Surgery) Nicholas Lose, MD as Consulting Physician (Hematology and Oncology) Gery Pray, MD as Consulting Physician (Radiation Oncology) Irene Limbo, MD as Consulting Physician (Plastic Surgery)  DIAGNOSIS:    ICD-10-CM   1. Malignant neoplasm of lower-inner quadrant of left breast in female, estrogen receptor positive ([place])  C50.312    Z17.0     SUMMARY OF ONCOLOGIC HISTORY: Oncology History  Malignant neoplasm of lower-inner quadrant of left breast in female, estrogen receptor positive ([place])  07/14/2017 Initial Diagnosis   Screening detected left breast asymmetry at 6 o'clock position, could not be measured accurately.  Axillary ultrasound was not performed.  Biopsy of the asymmetry revealed IDC grade 1, ER 95%, PR 60%, Ki-67 2%, HER-2 negative ratio 1.52, T1 NX stage Ia probable clinical stage   07/30/2017 Breast MRI   Biopsy-proven left breast 4.4 cm with non-mass enhancement overall extent 5.5 cm.  Additional clumped non-mass enhancement left breast 2 sites middle depth anterior depth suspicious for multifocal disease overall extent 8 cm, 5 morphologically abnormal lymph nodes in the left axilla 2 prominent lymph nodes in the right axilla   08/15/2017 Procedure   Left breast biopsy anterior anterior and lower outer quadrants DCIS high-grade; left axillary lymph node biopsy benign fatty tissue no lymph node tissue present   08/27/2017 -  Anti-estrogen oral therapy   Letrozole 2.5 mg daily   01/06/2018 Surgery   Left mastectomy with reconstruction on 01/06/2018: IDC, 3 cm, margins negative, 0/1 lymph node negative, grade 2, ER 100%, PR 0%, Ki-67 3%   01/06/2018 Oncotype testing   12/3%     CHIEF COMPLIANT: Follow-up of left breast cancer on letrozole therapy, complaining of rash on the left breast  INTERVAL HISTORY: Angela [name] is a 64  y.o. with above-mentioned history of left breast cancer for which she underwent neoadjuvant letrozole therapy, a mastectomy of the left breast, and is currently on antiestrogen therapy with letrozole. Mammogram on 01/04/20 showed no evidence of malignancy in the right breast. She presents to the clinic today for follow-up.   She came in today complaining of a rash on the left breast for the past couple of weeks.  Is been itching and causing some discomfort.  Is also having some pain in the axilla and in the back.  ALLERGIES:  is allergic to other, aspirin, and ibuprofen.  MEDICATIONS:  Current Outpatient Medications  Medication Sig Dispense Refill  . acetaminophen (TYLENOL) 500 MG tablet Take 1,000 mg by mouth every 6 (six) hours as needed for moderate pain or headache.    Marland Kitchen acetaZOLAMIDE (DIAMOX) 250 MG tablet Take 1 tablet (250 mg total) by mouth 3 (three) times daily. 90 tablet 2  . albuterol (PROAIR HFA) 108 (90 Base) MCG/ACT inhaler Inhale 2 puffs into the lungs every 4 (four) hours as needed for wheezing or shortness of breath. 18 g 1  . albuterol (PROVENTIL) (2.5 MG/3ML) 0.083% nebulizer solution Take 3 mLs (2.5 mg total) by nebulization every 6 (six) hours as needed for wheezing or shortness of breath. 75 mL 0  . albuterol (PROVENTIL) (2.5 MG/3ML) 0.083% nebulizer solution Take 3 mLs (2.5 mg total) by nebulization every 6 (six) hours as needed for wheezing or shortness of breath. 75 mL 1  . albuterol (VENTOLIN HFA) 108 (90 Base) MCG/ACT inhaler Inhale 2 puffs into the lungs every 4 (four) hours as needed for wheezing or shortness  of breath. 6.7 g 1  . atropine 1 % ophthalmic solution Place 1 drop into the right eye in the morning and at bedtime. (Patient not taking: Reported on 07/14/2019)    . azelastine (ASTELIN) 0.1 % nasal spray 1-2 sprays each nostril 2 times daily as needed 30 mL 5  . bacitracin-polymyxin b (POLYSPORIN) ophthalmic ointment Place 1 application into the right eye at  bedtime. apply to right eye as needed and at bedtime (Patient not taking: Reported on 07/14/2019) 3.5 g 4  . benzonatate (TESSALON) 100 MG capsule Take 1 capsule (100 mg total) by mouth every 8 (eight) hours. 21 capsule 0  . brimonidine (ALPHAGAN P) 0.1 % SOLN Place 1 drop into both eyes 3 (three) times daily. 15 mL 3  . budesonide-formoterol (SYMBICORT) 160-4.5 MCG/ACT inhaler Inhale 2 puffs into the lungs 2 (two) times daily. 1 Inhaler 5  . diphenhydrAMINE (BENADRYL) 25 MG tablet Take 25 mg by mouth every 6 (six) hours as needed for itching or allergies.    Marland Kitchen dorzolamide-timolol (COSOPT) 22.3-6.8 MG/ML ophthalmic solution Place 1 drop into the right eye 3 (three) times daily. 10 mL 4  . letrozole (FEMARA) 2.5 MG tablet Take 1 tablet (2.5 mg total) by mouth daily. 90 tablet 3  . levothyroxine (SYNTHROID, LEVOTHROID) 125 MCG tablet Take 125 mcg by mouth daily before breakfast.  3  . loratadine (CLARITIN) 10 MG tablet Take 10 mg by mouth daily.    Marland Kitchen LOTEMAX SM 0.38 % GEL Place 1 drop into the right eye 2 (two) times daily. 5 g 3  . montelukast (SINGULAIR) 10 MG tablet Take 10 mg by mouth at bedtime.    . prednisoLONE acetate (PRED FORTE) 1 % ophthalmic suspension Place 1 drop into the right eye 3 (three) times daily. 15 mL 0  . triamcinolone (NASACORT AQ) 55 MCG/ACT AERO nasal inhaler Place 1 spray into the nose daily as needed (allergies).      No current facility-administered medications for this visit.    PHYSICAL EXAMINATION: ECOG PERFORMANCE STATUS: 1 - Symptomatic but completely ambulatory  Vitals:   01/10/20 0947  BP: (!) 158/97  Pulse: 80  Resp: 17  Temp: 97.7 F (36.5 C)  SpO2: 100%   Filed Weights   01/10/20 0947  Weight: 243 lb 12.8 oz (110.6 kg)    BREAST: Left breast reconstructed, no palpable lumps or nodules but there is a rash on the left breast in a dermatomal distribution with grouped vesicles.  (exam performed in the presence of a chaperone)    LABORATORY DATA:   I have reviewed the data as listed CMP Latest Ref Rng & Units 05/21/2019 05/21/2019 07/23/2017  Glucose 70 - 99 mg/dL - 89 95  BUN 8 - 23 mg/dL - 13 14  Creatinine 0.44 - 1.00 mg/dL - 0.78 0.94  Sodium 135 - 145 mmol/L 147(H) 142 144  Potassium 3.5 - 5.1 mmol/L 2.9(L) 3.0(L) 4.0  Chloride 98 - 111 mmol/L - 113(H) 109  CO2 22 - 32 mmol/L - 23 24  Calcium 8.9 - 10.3 mg/dL - 7.4(L) 9.6  Total Protein 6.5 - 8.1 g/dL - - 7.7  Total Bilirubin 0.3 - 1.2 mg/dL - - 0.2(L)  Alkaline Phos 38 - 126 U/L - - 79  AST 15 - 41 U/L - - 11(L)  ALT 0 - 44 U/L - - 12    Lab Results  Component Value Date   WBC 6.4 05/21/2019   HGB 11.6 (L) 05/21/2019   HCT  34.0 (L) 05/21/2019   MCV 76.3 (L) 05/21/2019   PLT 263 05/21/2019   NEUTROABS 4.3 07/23/2017    ASSESSMENT & PLAN:  Malignant neoplasm of lower-inner quadrant of left breast in female, estrogen receptor positive ([place]) 07/14/2017:Screening detected left breast asymmetry at 6 o'clock position, could not be measured accurately. Axillary ultrasound was not performed. Biopsy of the asymmetry revealed IDC grade 1, ER 95%, PR 60%, Ki-67 2%, HER-2 negative ratio 1.52, T1 NX stage Ia probable clinical stage  Additional biopsies left breast showed DCIS Left axillary lymph node biopsy came back benign although there was no lymphoid tissue. Oncotype DX score 12: 3% risk of distant recurrence of 9 years, no chemo benefit Left mastectomy with reconstruction on 01/06/2018: IDC, 3 cm, margins negative, 0/1 lymph node negative, grade 2, ER 100%, PR 0%, Ki-67 3%  Current treatment:Adjuvantantiestrogen therapy with letrozole (letrozole was started neoadjuvantlyin July 2019) Letrozole toxicities: Denies any adverse effects to letrozole.  Breast cancer surveillance: 1.  Breast exam 01/10/2020: Benign 2.  Mammogram right breast: 01/06/2020: Benign breast density category C  Rash on the left breast: I suspect that this is shingles.  I sent a prescription for  Valtrex 1000 mg p.o. twice daily. Return to clinic in 2 to 3 weeks to assess the rash and follow-up.    No orders of the defined types were placed in this encounter.  The patient has a good understanding of the overall plan. she agrees with it. she will call with any problems that may develop before the next visit here.  Total time spent: 30 mins including face to face time and time spent for planning, charting and coordination of care  Nicholas Lose, MD 01/10/2020  I, Cloyde Reams Dorshimer, am acting as scribe for Dr. Nicholas Lose.  I have reviewed the above documentation for accuracy and completeness, and I agree with the above.

## 2020-01-10 ENCOUNTER — Other Ambulatory Visit: Payer: Self-pay

## 2020-01-10 ENCOUNTER — Inpatient Hospital Stay: Payer: 59 | Attending: Hematology and Oncology | Admitting: Hematology and Oncology

## 2020-01-10 DIAGNOSIS — Z170 Estrogen receptor positive status [ER+]: Secondary | ICD-10-CM

## 2020-01-10 DIAGNOSIS — R21 Rash and other nonspecific skin eruption: Secondary | ICD-10-CM | POA: Insufficient documentation

## 2020-01-10 DIAGNOSIS — Z79811 Long term (current) use of aromatase inhibitors: Secondary | ICD-10-CM | POA: Diagnosis not present

## 2020-01-10 DIAGNOSIS — C50312 Malignant neoplasm of lower-inner quadrant of left female breast: Secondary | ICD-10-CM | POA: Diagnosis not present

## 2020-01-10 MED ORDER — PREDNISOLONE ACETATE 1 % OP SUSP: 1.0000 [drp] | Freq: Every day | OPHTHALMIC | 0 refills | Status: DC

## 2020-01-10 MED ORDER — LEVOTHYROXINE SODIUM 137 MCG PO TABS: 125.0000 ug | ORAL_TABLET | Freq: Every day | ORAL | Status: DC

## 2020-01-10 MED ORDER — VALACYCLOVIR HCL 1 G PO TABS: 1000.0000 mg | ORAL_TABLET | Freq: Two times a day (BID) | ORAL | 0 refills | Status: DC

## 2020-01-10 NOTE — Assessment & Plan Note (Signed)
07/14/2017:Screening detected left breast asymmetry at 6 o'clock position, could not be measured accurately. Axillary ultrasound was not performed. Biopsy of the asymmetry revealed IDC grade 1, ER 95%, PR 60%, Ki-67 2%, HER-2 negative ratio 1.52, T1 NX stage Ia probable clinical stage  Additional biopsies left breast showed DCIS Left axillary lymph node biopsy came back benign although there was no lymphoid tissue. Oncotype DX score 12: 3% risk of distant recurrence of 9 years, no chemo benefit Left mastectomy with reconstruction on 01/06/2018: IDC, 3 cm, margins negative, 0/1 lymph node negative, grade 2, ER 100%, PR 0%, Ki-67 3%  Current treatment:Adjuvantantiestrogen therapy with letrozole (letrozole was started neoadjuvantlyin July 2019) Letrozole toxicities: Denies any adverse effects to letrozole.  Breast cancer surveillance: 1.  Breast exam 01/10/2020: Benign 2.  Mammogram right breast: 01/06/2020: Benign breast density category C  Rash on the left breast: Unclear etiology she is going to apply cortisone cream.  If it does not get better then we may have to perform an MRI for further evaluation.  Return to clinic in 1 year for follow-up

## 2020-01-30 NOTE — Progress Notes (Signed)
Patient Care Team: Alroy Dust, L.[name], MD as PCP - General (Family Medicine) Rolm Bookbinder, MD as Consulting Physician (General Surgery) Nicholas Lose, MD as Consulting Physician (Hematology and Oncology) Gery Pray, MD as Consulting Physician (Radiation Oncology) Irene Limbo, MD as Consulting Physician (Plastic Surgery)  DIAGNOSIS:    ICD-10-CM   1. Malignant neoplasm of lower-inner quadrant of left breast in female, estrogen receptor positive ([place])  C50.312    Z17.0     SUMMARY OF ONCOLOGIC HISTORY: Oncology History  Malignant neoplasm of lower-inner quadrant of left breast in female, estrogen receptor positive ([place])  07/14/2017 Initial Diagnosis   Screening detected left breast asymmetry at 6 o'clock position, could not be measured accurately.  Axillary ultrasound was not performed.  Biopsy of the asymmetry revealed IDC grade 1, ER 95%, PR 60%, Ki-67 2%, HER-2 negative ratio 1.52, T1 NX stage Ia probable clinical stage   07/30/2017 Breast MRI   Biopsy-proven left breast 4.4 cm with non-mass enhancement overall extent 5.5 cm.  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 with  above-mentioned history of left breast cancer for which sheunderwent neoadjuvant letrozole therapy,a mastectomy of the left breast, and is currently on antiestrogen therapy with letrozole.She presents to the clinic todayfor follow-up of a rash on her left breast.   ALLERGIES:  is allergic to other, aspirin, and ibuprofen.  MEDICATIONS:  Current Outpatient Medications  Medication Sig Dispense Refill  . triamcinolone ointment (KENALOG) 0.5 % Apply 1 application topically 2 (two) times daily. 30 g 0  . acetaminophen (TYLENOL) 500 MG tablet Take 1,000 mg by mouth every 6 (six) hours as needed for moderate pain or headache.    . albuterol (PROAIR HFA) 108 (90 Base) MCG/ACT inhaler Inhale 2 puffs into the lungs every 4 (four) hours as needed for wheezing or shortness of breath. 18 g 1  . albuterol (PROVENTIL) (2.5 MG/3ML) 0.083% nebulizer solution Take 3 mLs (2.5 mg total) by nebulization every 6 (six) hours as needed for wheezing or shortness of breath. 75 mL 0  . albuterol (PROVENTIL) (2.5 MG/3ML) 0.083% nebulizer solution Take 3 mLs (2.5 mg total) by nebulization every 6 (six) hours as needed for wheezing or shortness of breath. 75 mL 1  . albuterol (VENTOLIN HFA) 108 (90 Base) MCG/ACT inhaler Inhale 2 puffs into the lungs every 4 (four) hours as needed for wheezing or shortness of breath. 6.7 g 1  . azelastine (ASTELIN) 0.1 % nasal spray 1-2 sprays each nostril 2 times daily as needed 30 mL 5  . benzonatate (TESSALON) 100 MG capsule Take 1 capsule (100 mg total) by mouth every 8 (eight) hours. 21 capsule 0  . budesonide-formoterol (SYMBICORT) 160-4.5 MCG/ACT inhaler Inhale 2 puffs into  the lungs 2 (two) times daily. 1 Inhaler 5  . diphenhydrAMINE (BENADRYL) 25 MG tablet Take 25 mg by mouth every 6 (six) hours as needed for itching or allergies.    Marland Kitchen dorzolamide-timolol (COSOPT) 22.3-6.8 MG/ML ophthalmic solution Place 1 drop into the right eye 3 (three) times daily. 10 mL 4  . letrozole (FEMARA)  2.5 MG tablet Take 1 tablet (2.5 mg total) by mouth daily. 90 tablet 3  . levothyroxine (SYNTHROID) 137 MCG tablet Take 1 tablet (137 mcg total) by mouth daily before breakfast.    . loratadine (CLARITIN) 10 MG tablet Take 10 mg by mouth daily.    . montelukast (SINGULAIR) 10 MG tablet Take 10 mg by mouth at bedtime.    . prednisoLONE acetate (PRED FORTE) 1 % ophthalmic suspension Place 1 drop into the right eye daily. 15 mL 0  . triamcinolone (NASACORT AQ) 55 MCG/ACT AERO nasal inhaler Place 1 spray into the nose daily as needed (allergies).     . valACYclovir (VALTREX) 1000 MG tablet Take 1 tablet (1,000 mg total) by mouth 2 (two) times daily. 20 tablet 0   No current facility-administered medications for this visit.    PHYSICAL EXAMINATION: ECOG PERFORMANCE STATUS: 1 - Symptomatic but completely ambulatory  Vitals:   01/31/20 1539  BP: (!) 146/94  Pulse: 81  Resp: 17  Temp: 97.9 F (36.6 C)  SpO2: 100%   Filed Weights   01/31/20 1539  Weight: 243 lb 11.2 oz (110.5 kg)      BREAST: Rash in the left breast has changed in its texture.  It is no longer papular it is more eczematous in nature.  (exam performed in the presence of a chaperone)  LABORATORY DATA:  I have reviewed the data as listed CMP Latest Ref Rng & Units 05/21/2019 05/21/2019 07/23/2017  Glucose 70 - 99 mg/dL - 89 95  BUN 8 - 23 mg/dL - 13 14  Creatinine 0.44 - 1.00 mg/dL - 0.78 0.94  Sodium 135 - 145 mmol/L 147(H) 142 144  Potassium 3.5 - 5.1 mmol/L 2.9(L) 3.0(L) 4.0  Chloride 98 - 111 mmol/L - 113(H) 109  CO2 22 - 32 mmol/L - 23 24  Calcium 8.9 - 10.3 mg/dL - 7.4(L) 9.6  Total Protein 6.5 - 8.1 g/dL - - 7.7  Total Bilirubin 0.3 - 1.2 mg/dL - - 0.2(L)  Alkaline Phos 38 - 126 U/L - - 79  AST 15 - 41 U/L - - 11(L)  ALT 0 - 44 U/L - - 12    Lab Results  Component Value Date   WBC 6.4 05/21/2019   HGB 11.6 (L) 05/21/2019   HCT 34.0 (L) 05/21/2019   MCV 76.3 (L) 05/21/2019   PLT 263 05/21/2019    NEUTROABS 4.3 07/23/2017    ASSESSMENT & PLAN:  Malignant neoplasm of lower-inner quadrant of left breast in female, estrogen receptor positive ([place]) 07/14/2017:Screening detected left breast asymmetry at 6 o'clock position, could not be measured accurately. Axillary ultrasound was not performed. Biopsy of the asymmetry revealed IDC grade 1, ER 95%, PR 60%, Ki-67 2%, HER-2 negative ratio 1.52, T1 NX stage Ia probable clinical stage  Additional biopsies left breast showed DCIS Left axillary lymph node biopsy came back benign although there was no lymphoid tissue. Oncotype DX score 12: 3% risk of distant recurrence of 9 years, no chemo benefit Left mastectomy with reconstruction on 01/06/2018: IDC, 3 cm, margins negative, 0/1 lymph node negative, grade 2, ER 100%, PR 0%, Ki-67 3%  Current treatment:Adjuvantantiestrogen therapy with letrozole (letrozole was started neoadjuvantlyin July 2019) Letrozole toxicities:Denies any adverse effects to letrozole.  Breast cancer surveillance: 1.Breast exam 01/10/2020: Benign 2.Mammogram right breast: 01/06/2020: Benign breast density category C  Rash on the left breast:  Suspected to be shingles. sent for Valtrex 1000 mg p.o. twice daily.  She completed Valtrex and the rash appears to have changed from papular rash to an eczematous rash. I sent her prescription for triamcinolone. She tells me that she had a similar rash before and was seen by dermatology and she underwent a skin biopsy which came back as dermatitis.  I instructed her to see her dermatologist once again.  Return to clinic in 6 months for follow-up. No orders of the defined types were placed in this encounter.  The patient has a good understanding of the overall plan. she agrees with it. she will call with any problems that may develop before the next visit here.  Total time spent: 30 mins including face to face time and time spent for planning, charting and coordination of  care  Nicholas Lose, MD 01/31/2020  I, Cloyde Reams Dorshimer, am acting as scribe for Dr. Nicholas Lose.  I have reviewed the above documentation for accuracy and completeness, and I agree with the above.

## 2020-01-31 ENCOUNTER — Inpatient Hospital Stay: Payer: 59 | Attending: Hematology and Oncology | Admitting: Hematology and Oncology

## 2020-01-31 ENCOUNTER — Other Ambulatory Visit: Payer: Self-pay

## 2020-01-31 DIAGNOSIS — R21 Rash and other nonspecific skin eruption: Secondary | ICD-10-CM | POA: Diagnosis not present

## 2020-01-31 DIAGNOSIS — Z170 Estrogen receptor positive status [ER+]: Secondary | ICD-10-CM

## 2020-01-31 DIAGNOSIS — C50312 Malignant neoplasm of lower-inner quadrant of left female breast: Secondary | ICD-10-CM | POA: Diagnosis present

## 2020-01-31 DIAGNOSIS — Z9012 Acquired absence of left breast and nipple: Secondary | ICD-10-CM | POA: Diagnosis not present

## 2020-01-31 DIAGNOSIS — Z79811 Long term (current) use of aromatase inhibitors: Secondary | ICD-10-CM | POA: Diagnosis not present

## 2020-01-31 DIAGNOSIS — J329 Chronic sinusitis, unspecified: Secondary | ICD-10-CM | POA: Insufficient documentation

## 2020-01-31 MED ORDER — TRIAMCINOLONE ACETONIDE 0.5 % EX OINT: 1.0000 "application " | TOPICAL_OINTMENT | Freq: Two times a day (BID) | CUTANEOUS | 0 refills | Status: AC

## 2020-01-31 NOTE — Assessment & Plan Note (Signed)
07/14/2017:Screening detected left breast asymmetry at 6 o'clock position, could not be measured accurately. Axillary ultrasound was not performed. Biopsy of the asymmetry revealed IDC grade 1, ER 95%, PR 60%, Ki-67 2%, HER-2 negative ratio 1.52, T1 NX stage Ia probable clinical stage  Additional biopsies left breast showed DCIS Left axillary lymph node biopsy came back benign although there was no lymphoid tissue. Oncotype DX score 12: 3% risk of distant recurrence of 9 years, no chemo benefit Left mastectomy with reconstruction on 01/06/2018: IDC, 3 cm, margins negative, 0/1 lymph node negative, grade 2, ER 100%, PR 0%, Ki-67 3%  Current treatment:Adjuvantantiestrogen therapy with letrozole (letrozole was started neoadjuvantlyin July 2019) Letrozole toxicities:Denies any adverse effects to letrozole.  Breast cancer surveillance: 1.Breast exam 01/10/2020: Benign 2.Mammogram right breast: 01/06/2020: Benign breast density category C  Rash on the left breast: I suspect that this is shingles.  I sent a prescription for Valtrex 1000 mg p.o. twice daily. Return to clinic in 2 to 3 weeks to assess the rash and follow-up.

## 2020-02-16 ENCOUNTER — Other Ambulatory Visit: Payer: Self-pay | Admitting: Hematology and Oncology

## 2020-03-07 ENCOUNTER — Ambulatory Visit: Payer: 59 | Admitting: Hematology and Oncology

## 2020-06-21 IMAGING — MG DIGITAL SCREENING UNILATERAL RIGHT MAMMOGRAM WITH CAD AND TOMO
4 series · 4 of 12 positions shown · non-contrast
Comparison: Previous exam(s).

CLINICAL DATA: Screening.

EXAM:
DIGITAL SCREENING UNILATERAL RIGHT MAMMOGRAM WITH CAD AND TOMO

[R MLO synth-2D]
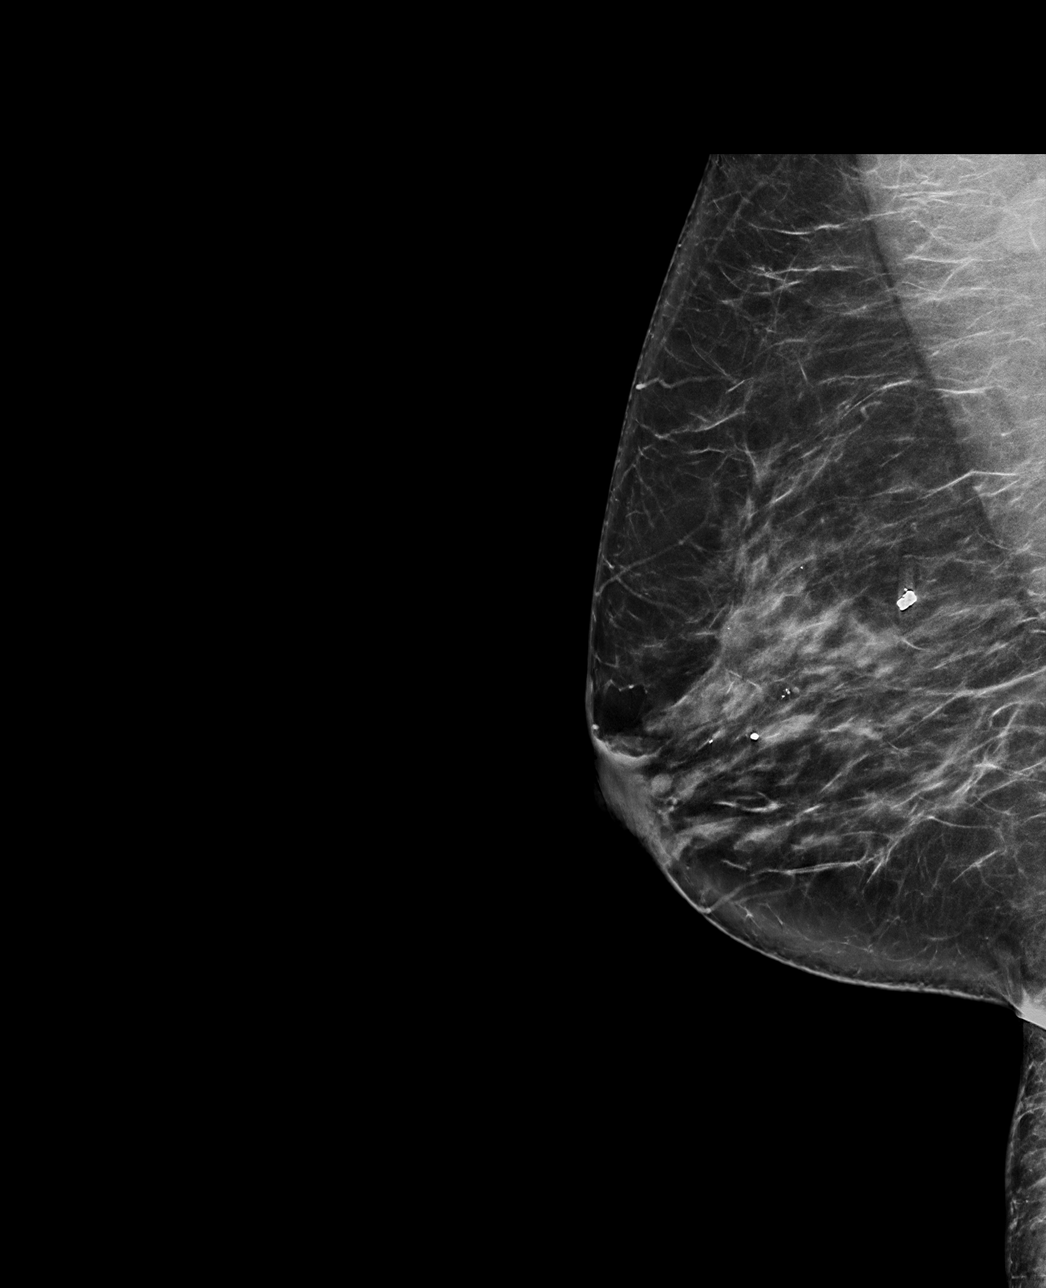

[R CC synth-2D]
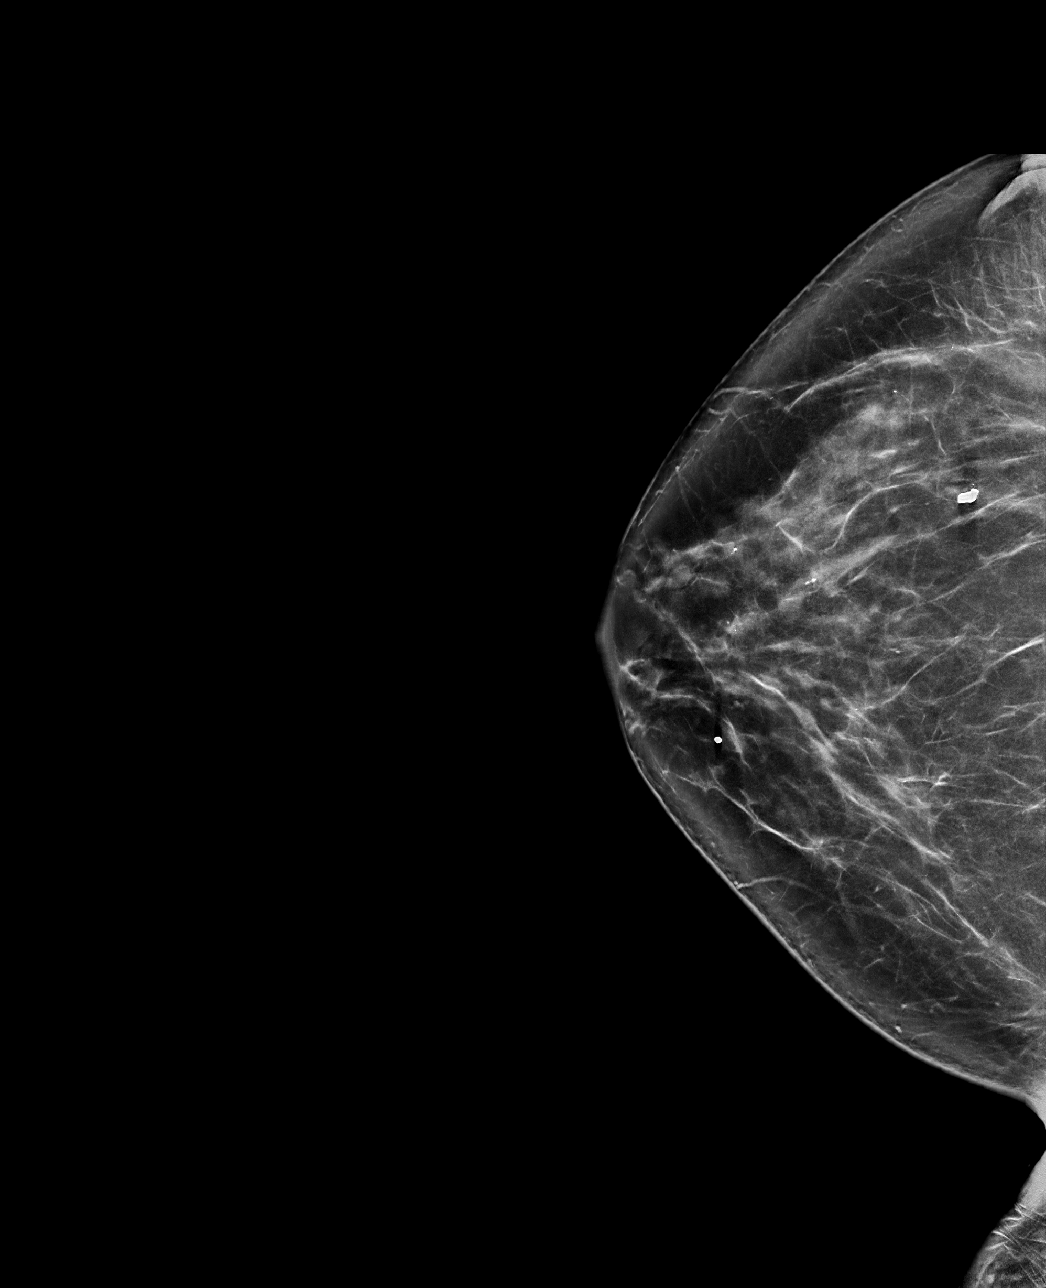

[R CC tomo · tomo slice 41/82.0]
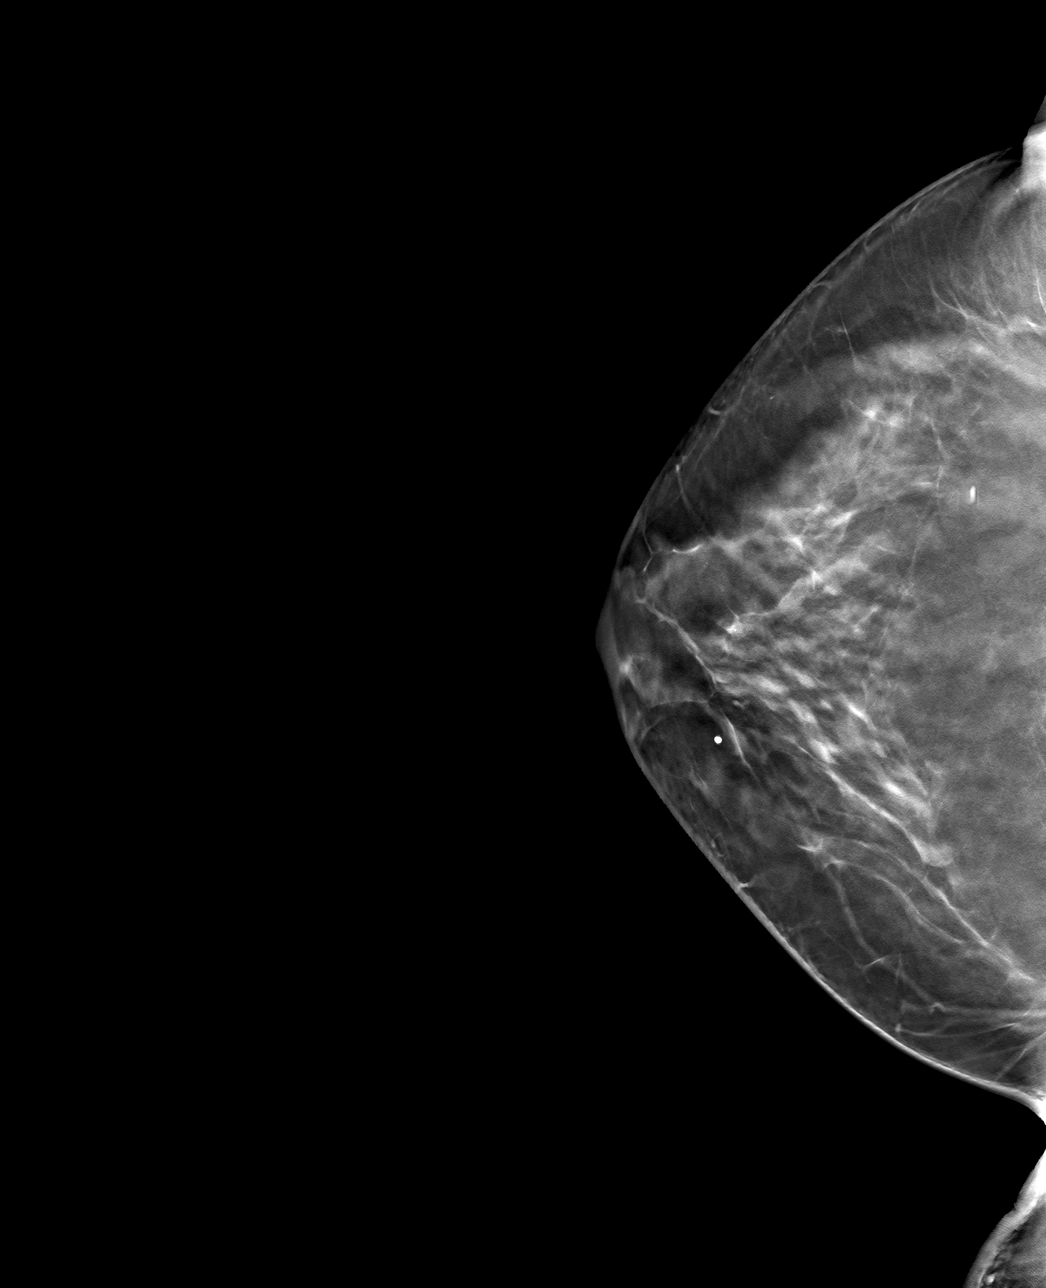

[R MLO tomo · tomo slice 42/83.0]
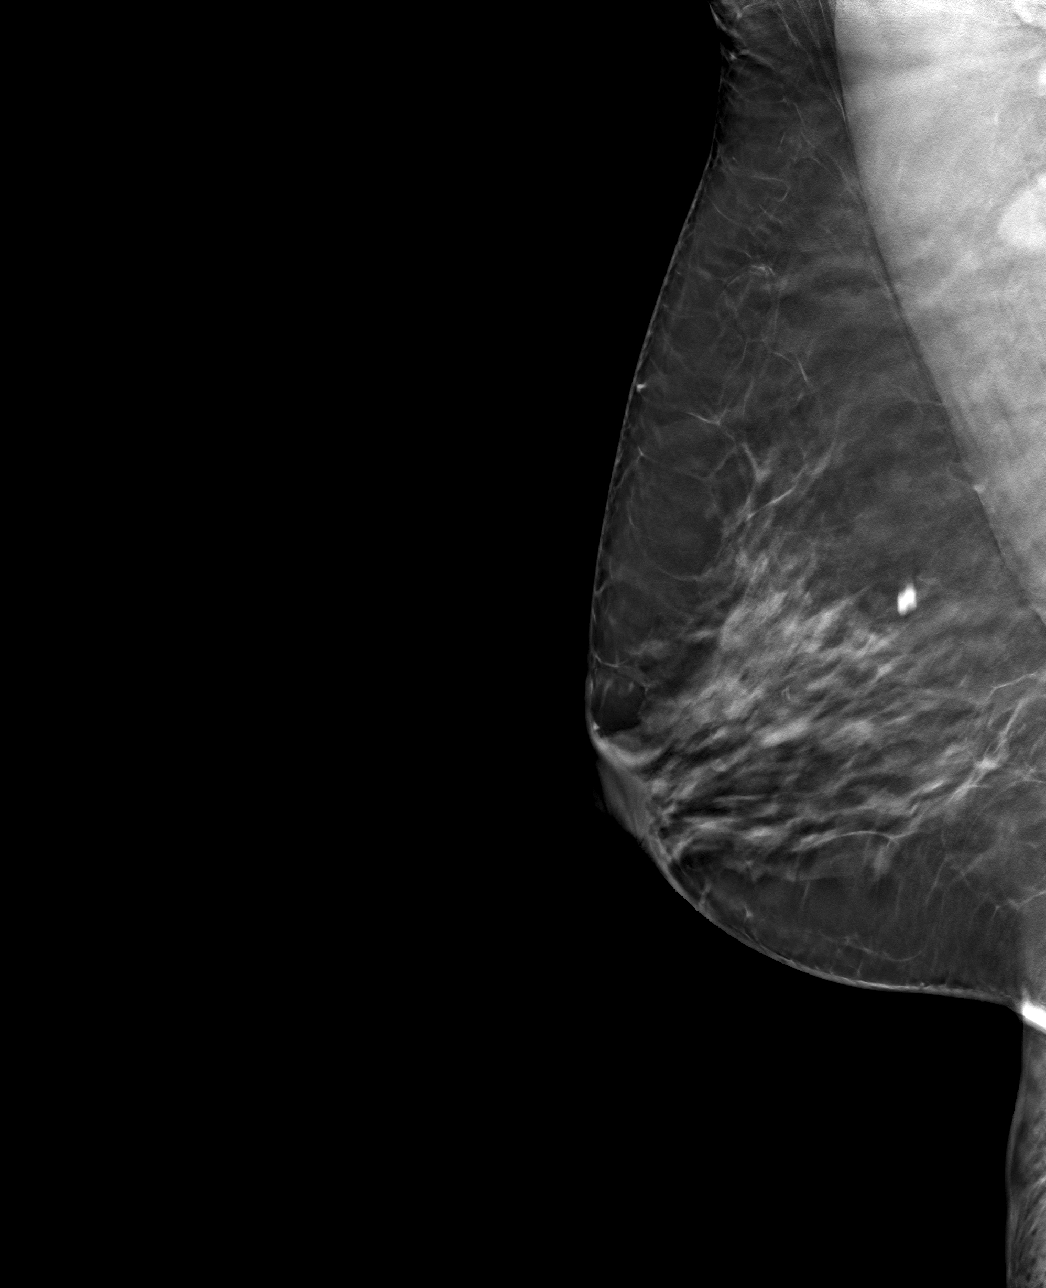

[4 of 12 positions shown; findings below may reference images not displayed]

ACR Breast Density Category c: The breast tissue is heterogeneously
dense, which may obscure small masses.
FINDINGS: The patient has had a left mastectomy. There are no findings
suspicious for malignancy. Images were processed with CAD.
IMPRESSION: No mammographic evidence of malignancy. A result letter of this
screening mammogram will be mailed directly to the patient.

RECOMMENDATION:
Screening mammogram in one year.  (Code:[id number]-[id number])

BI-RADS CATEGORY  1: Negative.

## 2020-07-28 DEATH — deceased

## 2020-07-31 NOTE — Progress Notes (Signed)
Patient Care Team: Alroy Dust, L.[name], MD as PCP - General (Family Medicine) Rolm Bookbinder, MD as Consulting Physician (General Surgery) Nicholas Lose, MD as Consulting Physician (Hematology and Oncology) Gery Pray, MD as Consulting Physician (Radiation Oncology) Irene Limbo, MD as Consulting Physician (Plastic Surgery)  DIAGNOSIS:    ICD-10-CM   1. Malignant neoplasm of lower-inner quadrant of left breast in female, estrogen receptor positive ([place])  C50.312    Z17.0       SUMMARY OF ONCOLOGIC HISTORY: Oncology History  Malignant neoplasm of lower-inner quadrant of left breast in female, estrogen receptor positive ([place])  07/14/2017 Initial Diagnosis   Screening detected left breast asymmetry at 6 o'clock position, could not be measured accurately.  Axillary ultrasound was not performed.  Biopsy of the asymmetry revealed IDC grade 1, ER 95%, PR 60%, Ki-67 2%, HER-2 negative ratio 1.52, T1 NX stage Ia probable clinical stage    07/30/2017 Breast MRI   Biopsy-proven left breast 4.4 cm with non-mass enhancement overall extent 5.5 cm.  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 with above-mentioned  history of left breast cancer for which she underwent neoadjuvant letrozole therapy, a mastectomy of the left breast, and is currently on antiestrogen therapy with letrozole. She presents to the clinic today for follow-up.  Her major complaints are sensitivity and discomfort in the left breast.  She also felt a cervical lymph node and is concerned about that.  It is tender and's been there for about 5 weeks.  ALLERGIES:  is allergic to other, aspirin, and ibuprofen.  MEDICATIONS:  Current Outpatient Medications  Medication Sig Dispense Refill   acetaminophen (TYLENOL) 500 MG tablet Take 1,000 mg by mouth every 6 (six) hours as needed for moderate pain or headache.     albuterol (PROAIR HFA) 108 (90 Base) MCG/ACT inhaler Inhale 2 puffs into the lungs every 4 (four) hours as needed for wheezing or shortness of breath. 18 g 1   albuterol (PROVENTIL) (2.5 MG/3ML) 0.083% nebulizer solution Take 3 mLs (2.5 mg total) by nebulization every 6 (six) hours as needed for wheezing or shortness of breath. 75 mL 0   albuterol (PROVENTIL) (2.5 MG/3ML) 0.083% nebulizer solution Take 3 mLs (2.5 mg total) by nebulization every 6 (six) hours as needed for wheezing or shortness of breath. 75 mL 1   albuterol (VENTOLIN HFA) 108 (90 Base) MCG/ACT inhaler Inhale 2 puffs into the lungs every 4 (four) hours as needed for wheezing or shortness of breath. 6.7 g 1   azelastine (ASTELIN) 0.1 % nasal spray 1-2 sprays each nostril 2 times daily as needed 30 mL 5   benzonatate (TESSALON) 100 MG capsule Take 1 capsule (100 mg total)  by mouth every 8 (eight) hours. 21 capsule 0   budesonide-formoterol (SYMBICORT) 160-4.5 MCG/ACT inhaler Inhale 2 puffs into the lungs 2 (two) times daily. 1 Inhaler 5   diphenhydrAMINE (BENADRYL) 25 MG tablet Take 25 mg by mouth every 6 (six) hours as needed for itching or allergies.     dorzolamide-timolol (COSOPT) 22.3-6.8 MG/ML ophthalmic solution Place 1 drop into the right eye 3 (three) times daily.  10 mL 4   letrozole (FEMARA) 2.5 MG tablet TAKE 1 TABLET BY MOUTH EVERY DAY 90 tablet 3   levothyroxine (SYNTHROID) 137 MCG tablet Take 1 tablet (137 mcg total) by mouth daily before breakfast.     loratadine (CLARITIN) 10 MG tablet Take 10 mg by mouth daily.     montelukast (SINGULAIR) 10 MG tablet Take 10 mg by mouth at bedtime.     prednisoLONE acetate (PRED FORTE) 1 % ophthalmic suspension Place 1 drop into the right eye daily. 15 mL 0   triamcinolone (NASACORT AQ) 55 MCG/ACT AERO nasal inhaler Place 1 spray into the nose daily as needed (allergies).      triamcinolone ointment (KENALOG) 0.5 % Apply 1 application topically 2 (two) times daily. 30 g 0   valACYclovir (VALTREX) 1000 MG tablet Take 1 tablet (1,000 mg total) by mouth 2 (two) times daily. 20 tablet 0   No current facility-administered medications for this visit.    PHYSICAL EXAMINATION: ECOG PERFORMANCE STATUS: 1 - Symptomatic but completely ambulatory  Vitals:   08/01/20 0810  BP: (!) 154/98  Pulse: 68  Resp: 18  Temp: 97.6 F (36.4 C)  SpO2: 100%   There were no vitals filed for this visit.  BREAST: No palpable masses or nodules in either right or left breasts. No palpable axillary supraclavicular or infraclavicular adenopathy no breast tenderness or nipple discharge. (exam performed in the presence of a chaperone)  LABORATORY DATA:  I have reviewed the data as listed CMP Latest Ref Rng & Units 05/21/2019 05/21/2019 07/23/2017  Glucose 70 - 99 mg/dL - 89 95  BUN 8 - 23 mg/dL - 13 14  Creatinine 0.44 - 1.00 mg/dL - 0.78 0.94  Sodium 135 - 145 mmol/L 147(H) 142 144  Potassium 3.5 - 5.1 mmol/L 2.9(L) 3.0(L) 4.0  Chloride 98 - 111 mmol/L - 113(H) 109  CO2 22 - 32 mmol/L - 23 24  Calcium 8.9 - 10.3 mg/dL - 7.4(L) 9.6  Total Protein 6.5 - 8.1 g/dL - - 7.7  Total Bilirubin 0.3 - 1.2 mg/dL - - 0.2(L)  Alkaline Phos 38 - 126 U/L - - 79  AST 15 - 41 U/L - - 11(L)  ALT 0 - 44 U/L - - 12    Lab Results  Component  Value Date   WBC 6.4 05/21/2019   HGB 11.6 (L) 05/21/2019   HCT 34.0 (L) 05/21/2019   MCV 76.3 (L) 05/21/2019   PLT 263 05/21/2019   NEUTROABS 4.3 07/23/2017    ASSESSMENT & PLAN:  Malignant neoplasm of lower-inner quadrant of left breast in female, estrogen receptor positive ([place]) 07/14/2017:Screening detected left breast asymmetry at 6 o'clock position, could not be measured accurately.  Axillary ultrasound was not performed.  Biopsy of the asymmetry revealed IDC grade 1, ER 95%, PR 60%, Ki-67 2%, HER-2 negative ratio 1.52, T1 NX stage Ia probable clinical stage   Additional biopsies left breast showed DCIS Left axillary lymph node biopsy came back benign although there was no lymphoid tissue. Oncotype DX score 12: 3% risk of distant  recurrence of 9 years, no chemo benefit Left mastectomy with reconstruction on 01/06/2018: IDC, 3 cm, margins negative, 0/1 lymph node negative, grade 2, ER 100%, PR 0%, Ki-67 3%   Current treatment: Adjuvant antiestrogen therapy with letrozole (letrozole was started neoadjuvantly in July 2019) Letrozole toxicities: Denies any adverse effects to letrozole.   Breast cancer surveillance: 1.  Breast exam 08/01/2020: Benign 2.  Mammogram right breast: 01/06/2020: Benign breast density category C   Rash on the left breast: Resolved leaving a dark area on the skin Tenderness in the left breast: Benign and to physical exam.  Most likely related to physical exertion.  No imaging planned.  Anterior cervical lymph node versus thyroid nodule: We will obtain ultrasound of the neck for further evaluation.   Return to clinic in 1 year for follow-up    No orders of the defined types were placed in this encounter.  The patient has a good understanding of the overall plan. she agrees with it. she will call with any problems that may develop before the next visit here.  Total time spent: 30 mins including face to face time and time spent for planning, charting and  coordination of care  Rulon Eisenmenger, MD, MPH 08/01/2020  I, Thana Ates, am acting as scribe for Dr. Nicholas Lose.  I have reviewed the above documentation for accuracy and completeness, and I agree with the above.

## 2020-08-01 ENCOUNTER — Other Ambulatory Visit (HOSPITAL_COMMUNITY): Payer: 59

## 2020-08-01 ENCOUNTER — Other Ambulatory Visit: Payer: Self-pay

## 2020-08-01 ENCOUNTER — Telehealth: Payer: Self-pay

## 2020-08-01 ENCOUNTER — Inpatient Hospital Stay: Payer: 59 | Attending: Hematology and Oncology | Admitting: Hematology and Oncology

## 2020-08-01 VITALS — BP 154/98 | HR 68 | Temp 97.6°F | Resp 18 | Ht 65.5 in

## 2020-08-01 DIAGNOSIS — C50312 Malignant neoplasm of lower-inner quadrant of left female breast: Secondary | ICD-10-CM | POA: Insufficient documentation

## 2020-08-01 DIAGNOSIS — Z170 Estrogen receptor positive status [ER+]: Secondary | ICD-10-CM | POA: Insufficient documentation

## 2020-08-01 DIAGNOSIS — Z79811 Long term (current) use of aromatase inhibitors: Secondary | ICD-10-CM | POA: Diagnosis not present

## 2020-08-01 DIAGNOSIS — R221 Localized swelling, mass and lump, neck: Secondary | ICD-10-CM

## 2020-08-01 MED ORDER — PREDNISOLONE ACETATE 1 % OP SUSP: 3.0000 [drp] | Freq: Every day | OPHTHALMIC | 0 refills | Status: DC

## 2020-08-01 MED ORDER — DORZOLAMIDE HCL-TIMOLOL MAL 2-0.5 % OP SOLN: 1.0000 [drp] | Freq: Two times a day (BID) | OPHTHALMIC | 4 refills | Status: AC

## 2020-08-01 NOTE — Telephone Encounter (Signed)
MD order STAT ultrasound.  RN scheduled ultrasound for 08/01/2020 @ 2:30pm @ [place].   RN notified patient, patient unable to do today.  Patient requested number to have rescheduled.  Pt is now scheduled for 7/13.

## 2020-08-01 NOTE — Assessment & Plan Note (Addendum)
07/14/2017:Screening detected left breast asymmetry at 6 o'clock position, could not be measured accurately. Axillary ultrasound was not performed. Biopsy of the asymmetry revealed IDC grade 1, ER 95%, PR 60%, Ki-67 2%, HER-2 negative ratio 1.52, T1 NX stage Ia probable clinical stage  Additional biopsies left breast showed DCIS Left axillary lymph node biopsy came back benign although there was no lymphoid tissue. Oncotype DX score 12: 3% risk of distant recurrence of 9 years, no chemo benefit Left mastectomy with reconstruction on 01/06/2018: IDC, 3 cm, margins negative, 0/1 lymph node negative, grade 2, ER 100%, PR 0%, Ki-67 3%  Current treatment:Adjuvantantiestrogen therapy with letrozole (letrozole was started neoadjuvantlyin July 2019) Letrozole toxicities:Denies any adverse effects to letrozole.  Breast cancer surveillance: 1.Breast exam7/05/2020: Benign 2.Mammogram right breast:01/06/2020: Benign breast density category C  Rash on the left breast:    Return to clinic in 1 year for follow-up

## 2020-08-03 ENCOUNTER — Telehealth: Payer: Self-pay | Admitting: Hematology and Oncology

## 2020-08-03 NOTE — Telephone Encounter (Signed)
Scheduled appointment per 07/05 los. Patient is aware. 

## 2020-08-09 ENCOUNTER — Other Ambulatory Visit: Payer: Self-pay

## 2020-08-09 ENCOUNTER — Ambulatory Visit (HOSPITAL_COMMUNITY)
Admission: RE | Admit: 2020-08-09 | Discharge: 2020-08-09 | Disposition: A | Discharge status: Home / Self Care | Payer: 59 | Source: Ambulatory Visit | Attending: Hematology and Oncology | Admitting: Hematology and Oncology

## 2020-08-09 DIAGNOSIS — R221 Localized swelling, mass and lump, neck: Secondary | ICD-10-CM | POA: Diagnosis not present

## 2020-08-17 ENCOUNTER — Telehealth: Payer: Self-pay

## 2020-08-17 NOTE — Telephone Encounter (Signed)
MD reviewed patient's ultrasound results - Per MD no need follow up.  RN notified patient, verbalized understanding.

## 2021-03-01 ENCOUNTER — Other Ambulatory Visit: Payer: Self-pay | Admitting: Hematology and Oncology

## 2021-04-03 ENCOUNTER — Other Ambulatory Visit: Payer: Self-pay

## 2021-04-03 ENCOUNTER — Ambulatory Visit (INDEPENDENT_AMBULATORY_CARE_PROVIDER_SITE_OTHER): Payer: 59 | Admitting: Family

## 2021-04-03 ENCOUNTER — Telehealth: Payer: Self-pay | Admitting: Family

## 2021-04-03 ENCOUNTER — Telehealth: Payer: 59

## 2021-04-03 ENCOUNTER — Encounter: Payer: Self-pay | Admitting: Family

## 2021-04-03 VITALS — BP 134/84 | HR 65 | Temp 97.8°F | Resp 18 | Ht 65.5 in | Wt 238.5 lb

## 2021-04-03 DIAGNOSIS — J3089 Other allergic rhinitis: Secondary | ICD-10-CM | POA: Diagnosis not present

## 2021-04-03 DIAGNOSIS — J4541 Moderate persistent asthma with (acute) exacerbation: Secondary | ICD-10-CM

## 2021-04-03 MED ORDER — ALBUTEROL SULFATE (2.5 MG/3ML) 0.083% IN NEBU: 2.5000 mg | INHALATION_SOLUTION | Freq: Four times a day (QID) | RESPIRATORY_TRACT | 0 refills | Status: DC | PRN

## 2021-04-03 MED ORDER — BUDESONIDE-FORMOTEROL FUMARATE 160-4.5 MCG/ACT IN AERO: 2.0000 | INHALATION_SPRAY | Freq: Two times a day (BID) | RESPIRATORY_TRACT | 5 refills | Status: DC

## 2021-04-03 MED ORDER — ALBUTEROL SULFATE HFA 108 (90 BASE) MCG/ACT IN AERS: 2.0000 | INHALATION_SPRAY | Freq: Four times a day (QID) | RESPIRATORY_TRACT | 1 refills | Status: DC | PRN

## 2021-04-03 MED ORDER — PREDNISONE 10 MG PO TABS: ORAL_TABLET | ORAL | 0 refills | Status: DC

## 2021-04-03 MED ORDER — MONTELUKAST SODIUM 10 MG PO TABS: 10.0000 mg | ORAL_TABLET | Freq: Every day | ORAL | 5 refills | Status: DC

## 2021-04-03 NOTE — Progress Notes (Signed)
? ?[facility] [address] ?Dept: [phone] ? ?FOLLOW UP NOTE ? ?Patient ID: Angela [name], female    DOB: [birth year]  Age: 66 y.o. MRN: [id number] ?Date of Office Visit: 04/03/2021 ? ?Assessment  ?Chief Complaint: Wheezing (Over 1 week) ? ?HPI ?Angela [name] is a 66 year old female who presents today for an acute visit.  She was last seen on Jun 23, 2019 by Dr. Verlin Fester for moderate persistent asthma and allergic rhinitis.  Since her last office visit she reports that she had a cataract removed from her left eye a year ago and is on eyedrops.  Approximately  2 years ago she had retinal surgery with a stent in her right eye.  She uses a different eyedrop for her right eye.  She continues to follow-up with Dr. Midge Aver, her eye doctor, and has an appointment on March 20 of this year. ? ?Moderate persistent asthma is reported as not well controlled with Symbicort 160/4.5 mcg 2 puffs twice a day with spacer.  She ran out of Singulair 10 mg once a day last week and has albuterol to use as needed.  She reports for the past week or 2 she has had rattling in her chest, a lot of wheezing that is worse at night, and a cough that is productive.  What she is able to cough up is clear and white in color.  She has tightness in her chest at times for which her albuterol helps.  She has shortness of breath when going up and down stairs.  Her breathing does wake her up at night.  She has been using her albuterol inhaler every 4 hours for the past week.  She has been taking Benadryl to help her sleep at night so she does not hear the rattling/wheezing in her chest.  She denies fever, chills, lower extremity swelling, long distance travel, and recent surgery.  Last time she had to go to the emergency room or urgent care due to breathing problems was approximately 2 years ago and her last round of steroids for her asthma was approximately 2 years ago also. ? ?Allergic rhinitis is reported as moderately  controlled with azelastine nasal spray 1 to 2 sprays each nostril twice a day as needed and Claritin as needed.  She has not been using Nasacort nasal spray because she thought azelastine replaced Nasacort.  She does not use saline rinses because in the past when she used a Nettie pot it felt like moisture would get in her right ear and stay.  She reports rhinorrhea only if she takes Mucinex.  She also reports nasal congestion for which azelastine helps.  She denies postnasal drip.  She has not had any sinus infections since we last saw her. ? ? ?Drug Allergies:  ?Allergies  ?Allergen Reactions  ? Other Other (See Comments)  ?  NO BLOOD PRODUCTS - PT IN AGREEMENT FOR ALBUMIN OR ALBUMIN CONTAINING PRODUCTS PER CONSENT  ? Aspirin Other (See Comments)  ?  Stomach ulcers   ? Ibuprofen Other (See Comments)  ?  Cautious by MD due to kidney function   ? ? ?Review of Systems: ?Review of Systems  ?Constitutional:  Negative for chills and fever.  ?HENT:    ?     Reports rhinorrhea only if she takes Mucinex.  She also reports nasal congestion for which azelastine helps.  Denies postnasal drip.  ?Eyes:   ?     Reports itchy watery eyes at times  ?  Respiratory:  Positive for cough, shortness of breath and wheezing.   ?     Reports a lot of wheezing that is worse at night, productive cough with clear white sputum, tightness in her chest at times for which albuterol helps, and shortness of breath when going up and down stairs.  She also reports the cough keeps her up at night.  ?Cardiovascular:  Negative for chest pain and palpitations.  ?Gastrointestinal:   ?     Denies heartburn or reflux symptoms  ?Genitourinary:  Negative for frequency.  ?Skin:  Negative for itching and rash.  ?Neurological:  Negative for headaches.  ?Endo/Heme/Allergies:  Positive for environmental allergies.  ? ? ?Physical Exam: ?BP 134/84   Pulse 65   Temp 97.8 ?F (36.6 ?C)   Resp 18   Ht 5' 5.5" (1.664 m)   Wt 238 lb 8 oz (108.2 kg)   SpO2 97%   BMI  39.08 kg/m?   ? ?Physical Exam ?Constitutional:   ?   Appearance: Normal appearance.  ?HENT:  ?   Head: Normocephalic and atraumatic.  ?   Comments: Pharynx normal, eyes normal, ears normal, nose: Bilateral lower turbinates moderately edematous and pale with clear drainage noted ?   Right Ear: Tympanic membrane, ear canal and external ear normal.  ?   Left Ear: Tympanic membrane, ear canal and external ear normal.  ?   Mouth/Throat:  ?   Mouth: Mucous membranes are moist.  ?   Pharynx: Oropharynx is clear.  ?Eyes:  ?   Conjunctiva/sclera: Conjunctivae normal.  ?Cardiovascular:  ?   Rate and Rhythm: Normal rate and regular rhythm.  ?   Heart sounds: Normal heart sounds.  ?Pulmonary:  ?   Effort: Pulmonary effort is normal.  ?   Comments: Rhonchi heard throughout all lung fields.  After 4 puffs of Xopenex rhonchi a little less, but still throughout all lung fields. ?Musculoskeletal:  ?   Cervical back: Neck supple.  ?Skin: ?   General: Skin is warm.  ?Neurological:  ?   Mental Status: She is alert and oriented to person, place, and time.  ?Psychiatric:     ?   Mood and Affect: Mood normal.     ?   Behavior: Behavior normal.     ?   Thought Content: Thought content normal.     ?   Judgment: Judgment normal.  ? ? ?Diagnostics: ?FVC 1.95 L, FEV1 1.29 L (87%).  Predicted FVC 1.85 L, predicted FEV1 1.49 L.  Spirometry indicates possible mild obstruction.  Postbronchodilator response shows FVC 2.29 L, FEV1 1.46 L.  Spirometry indicates possible mild reduction.  There is a 13% change in FEV1. ? ?Assessment and Plan: ?1. Moderate persistent asthma with acute exacerbation   ?2. Other allergic rhinitis   ? ? ?No orders of the defined types were placed in this encounter. ? ? ?Patient Instructions  ?Moderate persistent asthma ?Continue Symbicort (budesonide/formoterol) 160/4.5 ?g, 2 inhalations twice a day with a spacer to help prevent cough and wheeze.  ?Due to your eye history we cannot use triple therapy for your asthma. We  will get lab work to see if you qualify for a biologic drug. We will call you with results once they are back ?Continue montelukast 10 mg daily at bedtime. We will send in a refill prescription ? May use albuterol 2 puffs every 4-6 hours if needed for cough, wheeze, tightness in chest, or shortness of breath OR albuterol via nebulizer 1 unit dose  via nebulizer every 4-6 hours as needed for cough, wheeze, tightness in chest, or shortness of breath. ?Asthma control goals:  ?Full participation in all desired activities (may need albuterol before activity) ?Albuterol use two time or less a week on average (not counting use with activity) ?Cough interfering with sleep two time or less a month ?Oral steroids no more than once a year ?No hospitalizations ? ?  ?Other allergic rhinitis ?Continue azelastine nasal spray, 1-2 sprays per nostril 2 times daily as needed for runny nose/drainage down throat ?Nasal saline spray (i.e., Simply Saline)  as needed and prior to medicated nasal sprays. ?Continue loratadine 10 mg once a day as needed for runny nose. ? ?Please let us know if this treatment plan is not working well for you ?Schedule a follow up appointment in 4-6 weeks or sooner if needed ?Return in about 4 weeks (around 05/01/2021), or if symptoms worsen or fail to improve. ?  ? ?Thank you for the opportunity to care for this patient.  Please do not hesitate to contact me with questions. ? ?Althea Charon, FNP ?[facility] ? ? ? ? ?

## 2021-04-03 NOTE — Patient Instructions (Addendum)
Moderate persistent asthma ? I would like for you to call your eye doctor ,Dr. Midge Aver, to get his approval before starting a low-dose steroid-prednisone 10 mg once a day for 10 days ?Let us know if you develop a fever or are not getting better ?Continue Symbicort (budesonide/formoterol) 160/4.5 ?g, 2 inhalations twice a day with a spacer to help prevent cough and wheeze.  ?Due to your eye history we cannot use triple therapy for your asthma. We will get lab work to see if you qualify for a biologic drug. We will call you with results once they are back ?Continue montelukast 10 mg daily at bedtime. We will send in a refill prescription ? May use albuterol 2 puffs every 4-6 hours if needed for cough, wheeze, tightness in chest, or shortness of breath OR albuterol via nebulizer 1 unit dose via nebulizer every 4-6 hours as needed for cough, wheeze, tightness in chest, or shortness of breath. ?Asthma control goals:  ?Full participation in all desired activities (may need albuterol before activity) ?Albuterol use two time or less a week on average (not counting use with activity) ?Cough interfering with sleep two time or less a month ?Oral steroids no more than once a year ?No hospitalizations ? ?  ?Other allergic rhinitis ?Continue azelastine nasal spray, 1-2 sprays per nostril 2 times daily as needed for runny nose/drainage down throat ?Nasal saline spray (i.e., Simply Saline)  as needed and prior to medicated nasal sprays. ?Continue loratadine 10 mg once a day as needed for runny nose. ? ?Please let us know if this treatment plan is not working well for you ?Schedule a follow up appointment in 4-6 weeks or sooner if needed ?

## 2021-04-03 NOTE — Telephone Encounter (Signed)
Called and left a message for patient to return call and see if she would be interested in stopping Symbicort 160/4.5 mcg and seeing if her insurance would cover AirDuo. This could be cheaper than Symbicort if it is covered. I would also like for her to get approval from her eye doctor to switch to AirDuo first before sending the prescription.Please let me know. ? ?Thank you, ?Althea Charon, FNP ?

## 2021-04-04 ENCOUNTER — Other Ambulatory Visit: Payer: Self-pay

## 2021-04-04 MED ORDER — AIRDUO DIGIHALER 232-14 MCG/ACT IN AEPB
1.0000 | INHALATION_SPRAY | Freq: Two times a day (BID) | RESPIRATORY_TRACT | 5 refills | Status: DC
Start: 2021-04-04 — End: 2021-04-05

## 2021-04-04 NOTE — Telephone Encounter (Signed)
LVM for patient to call the office back. Will advise patient of instructions when she calls back. Prescription sent in.  ?

## 2021-04-04 NOTE — Telephone Encounter (Signed)
Message left for patient to return my call.  

## 2021-04-04 NOTE — Telephone Encounter (Signed)
Please have her Stop Symbicort 160/4.5 mcg and start AirDuo 232/14 mcg 1 puff twice a day. Do not use a spacer with AirDuo. Make sure she rinses her mouth out afterwards. Please send her AirDuo prescription to [facility]. Let her know that this prescription goes to a specialty pharmacy and not her local pharmacy. If her insurance does not cover AirDuo resume using Symbicort 160/4.5 mcg.

## 2021-04-04 NOTE — Telephone Encounter (Signed)
Patient states she spoke to her eye doctor and he has approved the airduo and the prednisone.  ?

## 2021-04-04 NOTE — Telephone Encounter (Signed)
Thank you :)

## 2021-04-05 ENCOUNTER — Other Ambulatory Visit: Payer: Self-pay | Admitting: *Deleted

## 2021-04-05 LAB — ALLERGENS, ZONE 2

## 2021-04-05 LAB — CBC WITH DIFFERENTIAL
Basophils Absolute: 0.1 10*3/uL (ref 0.0–0.2)
Basos: 1 %
EOS (ABSOLUTE): 0.4 10*3/uL (ref 0.0–0.4)
Eos: 5 %
Hematocrit: 43.4 % (ref 34.0–46.6)
Hemoglobin: 13.9 g/dL (ref 11.1–15.9)
Immature Grans (Abs): 0 10*3/uL (ref 0.0–0.1)
Immature Granulocytes: 0 %
Lymphocytes Absolute: 2.2 10*3/uL (ref 0.7–3.1)
Lymphs: 26 %
MCH: 23.8 pg — ABNORMAL LOW (ref 26.6–33.0)
MCHC: 32 g/dL (ref 31.5–35.7)
MCV: 74 fL — ABNORMAL LOW (ref 79–97)
Monocytes Absolute: 0.6 10*3/uL (ref 0.1–0.9)
Monocytes: 7 %
Neutrophils Absolute: 5.2 10*3/uL (ref 1.4–7.0)
Neutrophils: 61 %
RBC: 5.83 x10E6/uL — ABNORMAL HIGH (ref 3.77–5.28)
RDW: 15.5 % — ABNORMAL HIGH (ref 11.7–15.4)
WBC: 8.5 10*3/uL (ref 3.4–10.8)

## 2021-04-05 MED ORDER — AIRDUO DIGIHALER 232-14 MCG/ACT IN AEPB: 1.0000 | INHALATION_SPRAY | Freq: Two times a day (BID) | RESPIRATORY_TRACT | 5 refills | Status: DC

## 2021-04-05 NOTE — Telephone Encounter (Signed)
Patient called back and was advised of the inhaler being sent in and how to use it. Patient verbalized understanding and will call back if she needs anything further.  ?

## 2021-04-10 NOTE — Progress Notes (Signed)
Please let Angela [name] know that we received her lab results.  ? ?Her lab work for environmental allergens (trees, weeds, grass, ragweed, weed pollen, mold, dust mite, cat,cockroach, and dog) were all negative.  For some reason her IgE was not drawn. ? ?Her lab work (elevated eosinophils) does qualify her for a biologic drug to help with her asthma. ?Please have her schedule a 4-6 week follow up appointment so we can discuss further. ? ?Also, please send a copy of her lap results to her primary care physician. ? ?

## 2021-05-28 ENCOUNTER — Ambulatory Visit: Payer: 59 | Admitting: Family

## 2021-07-12 ENCOUNTER — Other Ambulatory Visit: Payer: Self-pay

## 2021-07-12 ENCOUNTER — Other Ambulatory Visit: Payer: Self-pay | Admitting: Family

## 2021-07-12 MED ORDER — ALBUTEROL SULFATE (2.5 MG/3ML) 0.083% IN NEBU: 2.5000 mg | INHALATION_SOLUTION | Freq: Four times a day (QID) | RESPIRATORY_TRACT | 0 refills | Status: DC | PRN

## 2021-07-17 NOTE — Telephone Encounter (Signed)
It looks like this was already sent on 07/12/21

## 2021-08-01 NOTE — Assessment & Plan Note (Deleted)
07/14/2017:Screening detected left breast asymmetry at 6 o'clock position, could not be measured accurately. Axillary ultrasound was not performed. Biopsy of the asymmetry revealed IDC grade 1, ER 95%, PR 60%, Ki-67 2%, HER-2 negative ratio 1.52, T1 NX stage Ia probable clinical stage  Additional biopsies left breast showed DCIS Left axillary lymph node biopsy came back benign although there was no lymphoid tissue. Oncotype DX score 12: 3% risk of distant recurrence of 9 years, no chemo benefit Left mastectomy with reconstruction on 01/06/2018: IDC, 3 cm, margins negative, 0/1 lymph node negative, grade 2, ER 100%, PR 0%, Ki-67 3%  Current treatment:Adjuvantantiestrogen therapy with letrozole (letrozole was started neoadjuvantlyin July 2019) Letrozole toxicities:Denies any adverse effects to letrozole.  Breast cancer surveillance: 1.Breast exam7/05/2020: Benign 2.Mammogram right breast:01/06/2020: Benign breast density category C  Rash on the left breast:Resolved leaving a dark area on the skin Tenderness in the left breast: Benign and to physical exam.  Most likely related to physical exertion.  No imaging planned.  Anterior cervical lymph node versus thyroid nodule: We will obtain ultrasound of the neck for further evaluation.   Return to clinic in 1 year for follow-up

## 2021-08-02 ENCOUNTER — Inpatient Hospital Stay: Payer: 59 | Attending: Hematology and Oncology | Admitting: Hematology and Oncology

## 2021-08-02 DIAGNOSIS — C50312 Malignant neoplasm of lower-inner quadrant of left female breast: Secondary | ICD-10-CM

## 2021-08-10 ENCOUNTER — Telehealth: Payer: Self-pay | Admitting: Hematology and Oncology

## 2021-08-10 NOTE — Telephone Encounter (Signed)
Per 7/14 phone line pt called to schedule appointment with [facility].  Appointment scheduled per pt

## 2021-09-11 ENCOUNTER — Telehealth: Payer: Self-pay | Admitting: Hematology and Oncology

## 2021-09-11 NOTE — Telephone Encounter (Signed)
Rescheduled appointment per provider PAL. Left voicemail. 

## 2021-10-05 ENCOUNTER — Ambulatory Visit: Payer: 59 | Admitting: Hematology and Oncology

## 2021-10-17 ENCOUNTER — Inpatient Hospital Stay: Payer: 59 | Admitting: Hematology and Oncology

## 2021-10-31 NOTE — Progress Notes (Signed)
Patient Care Team: Clovis Riley, L.August Saucer, MD as PCP - General (Family Medicine) Emelia Loron, MD as Consulting Physician (General Surgery) Serena Croissant, MD as Consulting Physician (Hematology and Oncology) Antony Blackbird, MD as Consulting Physician (Radiation Oncology) Glenna Fellows, MD as Consulting Physician (Plastic Surgery)  DIAGNOSIS:  Encounter Diagnosis  Name Primary?   Malignant neoplasm of lower-inner quadrant of left breast in female, estrogen receptor positive (HCC)     SUMMARY OF ONCOLOGIC HISTORY: Oncology History  Malignant neoplasm of lower-inner quadrant of left breast in female, estrogen receptor positive (HCC)  07/14/2017 Initial Diagnosis   Screening detected left breast asymmetry at 6 o'clock position, could not be measured accurately.  Axillary ultrasound was not performed.  Biopsy of the asymmetry revealed IDC grade 1, ER 95%, PR 60%, Ki-67 2%, HER-2 negative ratio 1.52, T1 NX stage Ia probable clinical stage   07/30/2017 Breast MRI   Biopsy-proven left breast 4.4 cm with non-mass enhancement overall extent 5.5 cm.  Additional clumped non-mass enhancement left breast 2 sites middle depth anterior depth suspicious for multifocal disease overall extent 8 cm, 5 morphologically abnormal lymph nodes in the left axilla 2 prominent lymph nodes in the right axilla   08/15/2017 Procedure   Left breast biopsy anterior anterior and lower outer quadrants DCIS high-grade; left axillary lymph node biopsy benign fatty tissue no lymph node tissue present   08/27/2017 -  Anti-estrogen oral therapy   Letrozole 2.5 mg daily   01/06/2018 Surgery   Left mastectomy with reconstruction on 01/06/2018: IDC, 3 cm, margins negative, 0/1 lymph node negative, grade 2, ER 100%, PR 0%, Ki-67 3%   01/06/2018 Oncotype testing   12/3%     CHIEF COMPLIANT: Follow-up of left breast cancer on letrozole therapy  INTERVAL HISTORY: Angela [name] is a 66 y.o. with above-mentioned history of  left breast cancer for which she underwent neoadjuvant letrozole therapy, a mastectomy of the left breast, and is currently on antiestrogen therapy with letrozole. She presents to the clinic today for follow-up. She states that she had some fatigue and mild hot flashes, but she adjusted to it. She denies any side effects or symptoms. She is tolerating the letrozole extremely well. She does have some soreness in the nipple area. She does keep moisturizer on it. Denies    ALLERGIES:  is allergic to other, aspirin, and ibuprofen.  MEDICATIONS:  Current Outpatient Medications  Medication Sig Dispense Refill   acetaminophen (TYLENOL) 500 MG tablet Take 1,000 mg by mouth every 6 (six) hours as needed for moderate pain or headache.     AIRDUO DIGIHALER 232-14 MCG/ACT AEPB Inhale 1 puff into the lungs in the morning and at bedtime. 1 each 5   albuterol (PROVENTIL) (2.5 MG/3ML) 0.083% nebulizer solution Take 3 mLs (2.5 mg total) by nebulization every 6 (six) hours as needed for wheezing or shortness of breath. 75 mL 0   albuterol (VENTOLIN HFA) 108 (90 Base) MCG/ACT inhaler Inhale 2 puffs into the lungs every 6 (six) hours as needed for wheezing or shortness of breath. 8 g 1   azelastine (ASTELIN) 0.1 % nasal spray 1-2 sprays each nostril 2 times daily as needed 30 mL 5   diphenhydrAMINE (BENADRYL) 25 MG tablet Take 25 mg by mouth every 6 (six) hours as needed for itching or allergies.     dorzolamide-timolol (COSOPT) 22.3-6.8 MG/ML ophthalmic solution Place 1 drop into the left eye 2 (two) times daily. 10 mL 4   levothyroxine (SYNTHROID) 137 MCG tablet Take 1  tablet (137 mcg total) by mouth daily before breakfast.     loratadine (CLARITIN) 10 MG tablet Take 10 mg by mouth daily.     montelukast (SINGULAIR) 10 MG tablet Take 1 tablet (10 mg total) by mouth at bedtime. 30 tablet 5   predniSONE (DELTASONE) 10 MG tablet Take one tablet once a day for 10 days then stop 10 tablet 0   triamcinolone ointment  (KENALOG) 0.5 % Apply 1 application topically 2 (two) times daily. 30 g 0   letrozole (FEMARA) 2.5 MG tablet Take 1 tablet (2.5 mg total) by mouth daily. 90 tablet 3   No current facility-administered medications for this visit.    PHYSICAL EXAMINATION: ECOG PERFORMANCE STATUS: 1 - Symptomatic but completely ambulatory  Vitals:   11/06/21 0841  BP: (!) 140/87  Pulse: 64  Resp: 18  Temp: 97.9 F (36.6 C)  SpO2: 100%   Filed Weights   11/06/21 0841  Weight: 245 lb 6.4 oz (111.3 kg)    BREAST: No palpable masses or nodules in either right or left breasts.  Left reconstructed breast.  No palpable axillary supraclavicular or infraclavicular adenopathy no breast tenderness or nipple discharge. (exam performed in the presence of a chaperone)  LABORATORY DATA:  I have reviewed the data as listed    Latest Ref Rng & Units 05/21/2019   11:11 AM 05/21/2019   10:35 AM 07/23/2017    8:09 AM  CMP  Glucose 70 - 99 mg/dL  89  95   BUN 8 - 23 mg/dL  13  14   Creatinine 0.44 - 1.00 mg/dL  0.78  0.94   Sodium 135 - 145 mmol/L 147  142  144   Potassium 3.5 - 5.1 mmol/L 2.9  3.0  4.0   Chloride 98 - 111 mmol/L  113  109   CO2 22 - 32 mmol/L  23  24   Calcium 8.9 - 10.3 mg/dL  7.4  9.6   Total Protein 6.5 - 8.1 g/dL   7.7   Total Bilirubin 0.3 - 1.2 mg/dL   0.2   Alkaline Phos 38 - 126 U/L   79   AST 15 - 41 U/L   11   ALT 0 - 44 U/L   12     Lab Results  Component Value Date   WBC 8.5 04/03/2021   HGB 13.9 04/03/2021   HCT 43.4 04/03/2021   MCV 74 (L) 04/03/2021   PLT 263 05/21/2019   NEUTROABS 5.2 04/03/2021    ASSESSMENT & PLAN:  Malignant neoplasm of lower-inner quadrant of left breast in female, estrogen receptor positive ([facility]) 07/14/2017:Screening detected left breast asymmetry at 6 o'clock position, could not be measured accurately.  Axillary ultrasound was not performed.  Biopsy of the asymmetry revealed IDC grade 1, ER 95%, PR 60%, Ki-67 2%, HER-2 negative ratio 1.52, T1 NX  stage Ia probable clinical stage   Additional biopsies left breast showed DCIS Left axillary lymph node biopsy came back benign although there was no lymphoid tissue. Oncotype DX score 12: 3% risk of distant recurrence of 9 years, no chemo benefit Left mastectomy with reconstruction on 01/06/2018: IDC, 3 cm, margins negative, 0/1 lymph node negative, grade 2, ER 100%, PR 0%, Ki-67 3%   Current treatment: Adjuvant antiestrogen therapy with letrozole (letrozole was started neoadjuvantly in July 2019) Letrozole toxicities: Denies any adverse effects to letrozole.   Breast cancer surveillance: 1.  Breast exam 11/06/2021: Benign 2.  Mammogram right breast: 01/06/2020: Benign  breast density category C, she will need a new right breast mammogram she will call and make this appointment.  Cervical lymph node: 08/09/2020: Small and clinical follow-up suggested.  It has resolved. Obesity: We will refer to healthy weight loss clinic. Return to clinic in 1 year for follow-up    No orders of the defined types were placed in this encounter.  The patient has a good understanding of the overall plan. she agrees with it. she will call with any problems that may develop before the next visit here. Total time spent: 30 mins including face to face time and time spent for planning, charting and co-ordination of care   Harriette Ohara, MD 11/06/21    I Gardiner Coins am scribing for Dr. Lindi Adie  I have reviewed the above documentation for accuracy and completeness, and I agree with the above.

## 2021-11-06 ENCOUNTER — Other Ambulatory Visit: Payer: Self-pay

## 2021-11-06 ENCOUNTER — Inpatient Hospital Stay: Payer: 59 | Attending: Hematology and Oncology | Admitting: Hematology and Oncology

## 2021-11-06 ENCOUNTER — Other Ambulatory Visit: Payer: Self-pay | Admitting: *Deleted

## 2021-11-06 DIAGNOSIS — Z170 Estrogen receptor positive status [ER+]: Secondary | ICD-10-CM | POA: Insufficient documentation

## 2021-11-06 DIAGNOSIS — C50312 Malignant neoplasm of lower-inner quadrant of left female breast: Secondary | ICD-10-CM | POA: Insufficient documentation

## 2021-11-06 DIAGNOSIS — Z79811 Long term (current) use of aromatase inhibitors: Secondary | ICD-10-CM | POA: Diagnosis not present

## 2021-11-06 DIAGNOSIS — E663 Overweight: Secondary | ICD-10-CM

## 2021-11-06 MED ORDER — LETROZOLE 2.5 MG PO TABS: 2.5000 mg | ORAL_TABLET | Freq: Every day | ORAL | 3 refills | Status: DC

## 2021-11-06 NOTE — Assessment & Plan Note (Addendum)
07/14/2017:Screening detected left breast asymmetry at 6 o'clock position, could not be measured accurately. Axillary ultrasound was not performed. Biopsy of the asymmetry revealed IDC grade 1, ER 95%, PR 60%, Ki-67 2%, HER-2 negative ratio 1.52, T1 NX stage Ia probable clinical stage  Additional biopsies left breast showed DCIS Left axillary lymph node biopsy came back benign although there was no lymphoid tissue. Oncotype DX score 12: 3% risk of distant recurrence of 9 years, no chemo benefit Left mastectomy with reconstruction on 01/06/2018: IDC, 3 cm, margins negative, 0/1 lymph node negative, grade 2, ER 100%, PR 0%, Ki-67 3%  Current treatment:Adjuvantantiestrogen therapy with letrozole (letrozole was started neoadjuvantlyin July 2019) Letrozole toxicities:Denies any adverse effects to letrozole.  Breast cancer surveillance: 1.Breast exam10/10/2021: Benign 2.Mammogram right breast:01/06/2020: Benign breast density category C, she will need a new right breast mammogram she will call and make this appointment.  Cervical lymph node: 08/09/2020: Small and clinical follow-up suggested.  It has resolved.  Return to clinic in 1 year for follow-up

## 2021-11-26 ENCOUNTER — Other Ambulatory Visit: Payer: Self-pay | Admitting: Family

## 2021-11-27 ENCOUNTER — Encounter (INDEPENDENT_AMBULATORY_CARE_PROVIDER_SITE_OTHER): Payer: 59 | Admitting: Family Medicine

## 2021-11-30 ENCOUNTER — Other Ambulatory Visit: Payer: Self-pay | Admitting: Family

## 2021-11-30 NOTE — Telephone Encounter (Signed)
Would you like to change to Advair Diskus?

## 2021-12-06 NOTE — Telephone Encounter (Signed)
She is due for a follow up. She was last seen on 04/03/21. Please have her schedule a follow up appointment and send in a prescription for Advair 250/50 1 puff twice a day to help prevent cough and wheeze. Rinse mouth out after. No refills.

## 2021-12-10 MED ORDER — FLUTICASONE-SALMETEROL 250-50 MCG/ACT IN AEPB: 1.0000 | INHALATION_SPRAY | Freq: Two times a day (BID) | RESPIRATORY_TRACT | 0 refills | Status: DC

## 2021-12-10 NOTE — Addendum Note (Signed)
Addended by: Katherina Right D on: 12/10/2021 12:00 PM   Modules accepted: Orders

## 2021-12-13 ENCOUNTER — Other Ambulatory Visit: Payer: Self-pay | Admitting: Family

## 2021-12-14 NOTE — Telephone Encounter (Signed)
Pleasel advise to changing medication. Called and spoke with [facility] and they stated that it has been on backorder for some time. Called and spoke to a nearby [facility] and the Advair was out of stock too.

## 2021-12-16 NOTE — Telephone Encounter (Signed)
Lets try Dulera 100 mcg 2 puffs twice a day with spacer. Quantity 1 with 1 refill. She needs to schedule a follow up appointment.

## 2021-12-17 NOTE — Telephone Encounter (Signed)
Can we try different pharmacy for the Symbicort?

## 2021-12-18 NOTE — Telephone Encounter (Signed)
Lm for pts parent to call us back about this

## 2022-01-03 NOTE — Telephone Encounter (Signed)
Called and left a voicemail asking for patient to return call to discuss.  °

## 2022-01-11 ENCOUNTER — Telehealth: Payer: Self-pay | Admitting: Family

## 2022-01-11 NOTE — Telephone Encounter (Signed)
Called and spoke with the patient and she did state that she has been without Advair for some while. She stated that she was on Airduo but insurance did not prefer it so she has been on Advair but now it is on backorder. She does have some wheezing and a rattling sound and she has been having issues sleeping at night and her Albuterol is not helping much. She does have an appointment to see [name] this coming Monday. I advised to use her albuterol as directed every 4 hours and to try using it before bedtime to see if she can get some relief. I did advise that she can call us during our on call after hours if she needs [place] or if she uses her Albuterol back to back with no relief to please go to the emergency room to be evaluated. Patient verbalized understanding.

## 2022-01-11 NOTE — Patient Instructions (Incomplete)
Moderate persistent asthma Restart. Advair 250/50 g, 1 inhalation twice a day to help prevent cough and wheeze. Rinse mouth out afterwards. Let us know if you are not able to get this medication Due to your eye history we cannot use triple therapy for your asthma.  Continue montelukast 10 mg daily at bedtime Make sure and take this every day. This can also help with your allergies  May use albuterol 2 puffs every 4-6 hours if needed for cough, wheeze, tightness in chest, or shortness of breath OR albuterol via nebulizer 1 unit dose via nebulizer every 4-6 hours as needed for cough, wheeze, tightness in chest, or shortness of breath. If your breathing does not get better we do have the option of starting a biologic in the future. AEC  was 400 on 04/03/21. Start prednisone 10 mg taking 1 tablet twice a day for 4 days, then on the 5th day take one tablet and stop. Please call your eye doctor first and let us know if this is ok to prescribe.  I spoke with Dr. Zenia Resides office myself and they were okay with me prescribing prednisone.  Asthma control goals:  Full participation in all desired activities (may need albuterol before activity) Albuterol use two time or less a week on average (not counting use with activity) Cough interfering with sleep two time or less a month Oral steroids no more than once a year No hospitalizations    Other allergic rhinitis Continue azelastine nasal spray, 1-2 sprays per nostril 2 times daily as needed for runny nose/drainage down throat Nasal saline spray (i.e., Simply Saline)  as needed and prior to medicated nasal sprays. Continue loratadine 10 mg once a day as needed for runny nose. Continue Singulair 10 mg as above  Please schedule an appointment with your primary care physician due to your elevated blood pressure while in the office today.  Schedule a follow up appointment in 6 weeks or sooner if needed  Please let us know if this treatment plan is not  working well for you

## 2022-01-11 NOTE — Telephone Encounter (Signed)
Patient called the office requesting to speak with Angela [name] regarding her refill for her Advair inhaler. I did inform patient she is due for a follow up visit and is coming in to see [name] on 12/18.

## 2022-01-12 NOTE — Telephone Encounter (Signed)
noted 

## 2022-01-14 ENCOUNTER — Encounter: Payer: Self-pay | Admitting: Family

## 2022-01-14 ENCOUNTER — Other Ambulatory Visit: Payer: Self-pay

## 2022-01-14 ENCOUNTER — Ambulatory Visit (INDEPENDENT_AMBULATORY_CARE_PROVIDER_SITE_OTHER): Payer: 59 | Admitting: Family

## 2022-01-14 VITALS — BP 142/100 | HR 84 | Temp 97.2°F | Resp 16 | Wt 249.7 lb

## 2022-01-14 DIAGNOSIS — J4541 Moderate persistent asthma with (acute) exacerbation: Secondary | ICD-10-CM

## 2022-01-14 DIAGNOSIS — J3089 Other allergic rhinitis: Secondary | ICD-10-CM | POA: Diagnosis not present

## 2022-01-14 MED ORDER — FLUTICASONE-SALMETEROL 250-50 MCG/ACT IN AEPB: 1.0000 | INHALATION_SPRAY | Freq: Two times a day (BID) | RESPIRATORY_TRACT | 5 refills | Status: DC

## 2022-01-14 NOTE — Progress Notes (Signed)
Angela [name] [id number] Dept: [phone]  FOLLOW UP NOTE  Patient ID: Angela [name], female    DOB: 1955-10-17  Age: 66 y.o. MRN: [id number] Date of Office Visit: 01/14/2022  Assessment  Chief Complaint: Follow-up (Wheezing and coughing for the last few weeks)  HPI Angela [name] is a 65 year old female who presents today for follow-up of moderate persistent asthma and allergic rhinitis.  She was last seen on April 03, 2021 by myself.  She denies any new diagnosis or surgeries since her last office visit.  She does mention that she has another 2 years of letrozole to take for her breast cancer.  She does mention that when she had the mastectomy that they got all the cancer.  Moderate persistent asthma: She reports that she has been out of her Advair for a couple of months now, but for the past week to week and a half her asthma has been worse.  She has only been taking Singulair 10 mg as needed and has been using her albuterol as needed.  Though over the weekend she has been using her albuterol every 4 hours and her nebulizer with albuterol at night.  When she does use her albuterol it does relieve the tightness in her chest and does help with the wheezing a little.  She reports a cough that is productive at times with clear sputum, tightness in chest, wheezing, and a little bit of shortness of breath with exertion.  Her wheezing does keep her up at night.  She did take Benadryl last night because of the wheezing.  Since her last office visit she has not required any systemic steroids or made any trips to the emergency room or urgent care due to breathing problems.  She reports that when she was on Advair 250/50 1 puff twice a day consistently that her breathing was fine and she did not have to use her albuterol often.  Allergic rhinitis: She has azelastine nasal spray that she uses as needed, Singulair 10 mg as needed, and Claritin as needed.  She reports a feeling of phlegm in  her throat.  She did take some Mucinex and it helped some.  She also has white rhinorrhea, little nasal congestion, and postnasal drip sometimes.  She has not had any sinus infections since we last saw her.   Drug Allergies:  Allergies  Allergen Reactions   Other Other (See Comments)    NO BLOOD PRODUCTS - PT IN AGREEMENT FOR ALBUMIN OR ALBUMIN CONTAINING PRODUCTS PER CONSENT   Aspirin Other (See Comments)    Stomach ulcers    Ibuprofen Other (See Comments)    Cautious by MD due to kidney function     Review of Systems: Review of Systems  Constitutional:  Negative for chills and fever.       Denies body aches  HENT:         Reports rhinorrhea that is white in color and a little bit of stuffiness.  She sometimes has postnasal drip.  Denies sore throat  Eyes:        Denies itchy watery eyes  Respiratory:  Positive for cough, shortness of breath and wheezing.        Reports productive cough at times with clear sputum, tightness in chest, wheezing, a little bit of shortness of breath with exertion, and her wheezing waking her up at night.  Cardiovascular:  Negative for palpitations.       Reports her chest was  hurting over the weekend, but not as sore today.  It was sore in the center of her chest  Gastrointestinal:        Reports little bit of reflux, but is not often.  She will take Tums  Genitourinary:  Negative for frequency.  Skin:  Negative for itching and rash.  Neurological:  Negative for headaches.  Endo/Heme/Allergies:  Positive for environmental allergies.     Physical Exam: BP (!) 142/100   Pulse 84   Temp (!) 97.2 F (36.2 C) (Temporal)   Resp 16   Wt 249 lb 11.2 oz (113.3 kg)   SpO2 95%   BMI 40.92 kg/m    Physical Exam Constitutional:      Appearance: Normal appearance.  HENT:     Head: Normocephalic and atraumatic.     Comments: Pharynx normal, eyes normal, ears: Left ear normal, right ear: Blue PE tube present.  Nose: Bilateral lower turbinates mildly  edematous and slightly erythematous with no drainage noted    Right Ear: Tympanic membrane, ear canal and external ear normal.     Left Ear: Ear canal and external ear normal.     Mouth/Throat:     Mouth: Mucous membranes are moist.     Pharynx: Oropharynx is clear.  Eyes:     Conjunctiva/sclera: Conjunctivae normal.  Cardiovascular:     Rate and Rhythm: Normal rate and regular rhythm.     Heart sounds: Normal heart sounds.  Pulmonary:     Effort: Pulmonary effort is normal.     Comments: Rhonchi heard throughout all lung fields Musculoskeletal:     Cervical back: Neck supple.  Skin:    General: Skin is warm.  Neurological:     Mental Status: She is alert and oriented to person, place, and time.  Psychiatric:        Mood and Affect: Mood normal.        Behavior: Behavior normal.        Thought Content: Thought content normal.        Judgment: Judgment normal.     Diagnostics: Unable to obtain spirometry due to machine not working properly.  Assessment and Plan: 1. Moderate persistent asthma with acute exacerbation   2. Other allergic rhinitis     Meds ordered this encounter  Medications   fluticasone-salmeterol (ADVAIR) 250-50 MCG/ACT AEPB    Sig: Inhale 1 puff into the lungs in the morning and at bedtime.    Dispense:  60 each    Refill:  5    Ok to dispense generic or brand name    Patient Instructions  Moderate persistent asthma Restart. Advair 250/50 g, 1 inhalation twice a day to help prevent cough and wheeze. Rinse mouth out afterwards. Let us know if you are not able to get this medication Due to your eye history we cannot use triple therapy for your asthma.  Continue montelukast 10 mg daily at bedtime Make sure and take this every day. This can also help with your allergies  May use albuterol 2 puffs every 4-6 hours if needed for cough, wheeze, tightness in chest, or shortness of breath OR albuterol via nebulizer 1 unit dose via nebulizer every 4-6 hours as  needed for cough, wheeze, tightness in chest, or shortness of breath. If your breathing does not get better we do have the option of starting a biologic in the future. AEC  was 400 on 04/03/21. Start prednisone 10 mg taking 1 tablet twice a day for 4  days, then on the 5th day take one tablet and stop. Please call your eye doctor first and let us know if this is ok to prescribe.  I spoke with Dr. Earl Lites office myself and they were okay with me prescribing prednisone.  Asthma control goals:  Full participation in all desired activities (may need albuterol before activity) Albuterol use two time or less a week on average (not counting use with activity) Cough interfering with sleep two time or less a month Oral steroids no more than once a year No hospitalizations    Other allergic rhinitis Continue azelastine nasal spray, 1-2 sprays per nostril 2 times daily as needed for runny nose/drainage down throat Nasal saline spray (i.e., Simply Saline)  as needed and prior to medicated nasal sprays. Continue loratadine 10 mg once a day as needed for runny nose. Continue Singulair 10 mg as above  Please schedule an appointment with your primary care physician due to your elevated blood pressure while in the office today.  Schedule a follow up appointment in 6 weeks or sooner if needed  Please let us know if this treatment plan is not working well for you   Return in about 6 weeks (around 02/25/2022), or if symptoms worsen or fail to improve, for With spirometry.    Thank you for the opportunity to care for this patient.  Please do not hesitate to contact me with questions.  Althea Charon, FNP [facility]

## 2022-01-24 ENCOUNTER — Telehealth: Payer: Self-pay

## 2022-01-24 MED ORDER — FLUTICASONE FUROATE-VILANTEROL 200-25 MCG/ACT IN AEPB: 1.0000 | INHALATION_SPRAY | Freq: Every day | RESPIRATORY_TRACT | 5 refills | Status: AC

## 2022-01-24 NOTE — Telephone Encounter (Signed)
I sent in Breo 274mg one puff once daily.   JSalvatore Marvel MD [facility]

## 2022-01-24 NOTE — Telephone Encounter (Signed)
Called patient - DOB verified - advised of provider notation below.  Patient verbalized understanding, no further questions. 

## 2022-01-24 NOTE — Telephone Encounter (Signed)
Patient called in - DOB verified - stated she was advised by [facility] that the Advair is on backorder.  Patient advised message will be forwarded to a provider since Dr. Nelva Bush and [name] are not in the office today for alternative  - she will be contacted once a provider responds.  Patient verbalized understanding, no further questions.

## 2022-01-24 NOTE — Addendum Note (Signed)
Addended by: Valentina Shaggy on: 01/24/2022 01:43 PM   Modules accepted: Orders

## 2022-02-08 NOTE — Telephone Encounter (Signed)
Patient has since come in to be seen with the provider and an alternative inhaler has been sent in.

## 2022-02-23 NOTE — Patient Instructions (Incomplete)
Moderate persistent asthma Continue Breo 200 mcg 1 puff once  a day to help prevent cough and wheeze. Rinse mouth out afterwards.  Due to your eye history we cannot use triple therapy for your asthma.  Continue montelukast 10 mg daily at bedtime Make sure and take this every day. This can also help with your allergies  May use albuterol 2 puffs every 4-6 hours if needed for cough, wheeze, tightness in chest, or shortness of breath OR albuterol via nebulizer 1 unit dose via nebulizer every 4-6 hours as needed for cough, wheeze, tightness in chest, or shortness of breath. If your breathing does not get better we do have the option of starting a biologic in the future. AEC  was 400 on 04/03/21..  Asthma control goals:  Full participation in all desired activities (may need albuterol before activity) Albuterol use two time or less a week on average (not counting use with activity) Cough interfering with sleep two time or less a month Oral steroids no more than once a year No hospitalizations    Other allergic rhinitis Continue azelastine nasal spray, 1-2 sprays per nostril 2 times daily as needed for runny nose/drainage down throat Nasal saline spray (i.e., Simply Saline)  as needed and prior to medicated nasal sprays. Continue loratadine 10 mg once a day as needed for runny nose. Continue Singulair 10 mg as above .  Schedule a follow up appointment in months or sooner if needed  Please let us know if this treatment plan is not working well for you

## 2022-02-25 ENCOUNTER — Other Ambulatory Visit: Payer: Self-pay

## 2022-02-25 ENCOUNTER — Encounter: Payer: Self-pay | Admitting: Family

## 2022-02-25 ENCOUNTER — Ambulatory Visit (INDEPENDENT_AMBULATORY_CARE_PROVIDER_SITE_OTHER): Payer: 59 | Admitting: Family

## 2022-02-25 VITALS — BP 132/90 | HR 73 | Temp 98.3°F | Resp 16 | Wt 243.9 lb

## 2022-02-25 DIAGNOSIS — J4540 Moderate persistent asthma, uncomplicated: Secondary | ICD-10-CM | POA: Diagnosis not present

## 2022-02-25 DIAGNOSIS — J3089 Other allergic rhinitis: Secondary | ICD-10-CM

## 2022-02-25 MED ORDER — ALBUTEROL SULFATE HFA 108 (90 BASE) MCG/ACT IN AERS: 2.0000 | INHALATION_SPRAY | Freq: Four times a day (QID) | RESPIRATORY_TRACT | 1 refills | Status: DC | PRN

## 2022-02-25 MED ORDER — MONTELUKAST SODIUM 10 MG PO TABS: 10.0000 mg | ORAL_TABLET | Freq: Every day | ORAL | 5 refills | Status: DC

## 2022-02-25 NOTE — Progress Notes (Signed)
[place] [address] Dept: [phone]  FOLLOW UP NOTE  Patient ID: Angela [name], female    DOB: 17-May-1955  Age: 67 y.o. MRN: [id number] Date of Office Visit: 02/25/2022  Assessment  Chief Complaint: Follow-up (Doing much better with all the medications. )  HPI Angela [name] is a 67 year old female who presents today for follow-up of moderate persistent asthma with acute exacerbation and allergic rhinitis.  She was last seen on January 14, 2022 by myself.  She denies any new diagnosis or surgery since her last office visit.  Moderate persistent asthma: She reports that her breathing is much better.  She is currently taking Breo 200 mcg 1 puff once a day, montelukast 10 mg once a day and albuterol as needed.  She will report occasional cough is productive with foamy sputum if a coworker has incense or strong fragrance.  This does not occur often.  She also will have tightness in her chest and a little bit of wheezing if the weather changes.  She denies shortness of breath and nocturnal awakenings due to breathing problems.  Since her last office visit she has not required any other systemic steroids than the ones I prescribed or made any trips to the emergency room or urgent care due to breathing problems.  She does use steroid eyedrops for her eyes.  She uses her albuterol maybe once every other week.  Allergic rhinitis: She continues to take loratadine 10 mg once a day and azelastine nasal spray once a day.  She does feel like her nasal congestion is better since learning how to properly use nose sprays.  She reports a little bit of nasal congestion and denies postnasal drip and rhinorrhea.  She has not had any sinus infections since we last saw her.   Drug Allergies:  Allergies  Allergen Reactions   Other Other (See Comments)    NO BLOOD PRODUCTS - PT IN AGREEMENT FOR ALBUMIN OR ALBUMIN CONTAINING PRODUCTS PER CONSENT   Aspirin Other (See Comments)    Stomach  ulcers    Ibuprofen Other (See Comments)    Cautious by MD due to kidney function     Review of Systems: Review of Systems  Constitutional:  Negative for chills and fever.  HENT:         Reports a little bit of nasal congestion, but using her nasal spray helps.  Denies rhinorrhea and postnasal drip  Eyes:        Denies itchy watery eyes  Respiratory:  Positive for cough and wheezing. Negative for shortness of breath.        Reports  productive cough with foamy sputum at times when a coworker uses incense or a strong fragrance.  Also reports little bit of wheezing depending on the weather, and a little bit of tightness depending on the weather.  Denies shortness of breath and nocturnal awakenings due to breathing problems.  Cardiovascular:  Negative for chest pain and palpitations.  Gastrointestinal:  Positive for heartburn.       Reports heartburn symptoms occurring maybe once every other week.  Denies reflux symptoms  Genitourinary:  Negative for frequency.  Skin:  Negative for itching and rash.  Neurological:  Negative for headaches.     Physical Exam: BP (!) 132/90   Pulse 73   Temp 98.3 F (36.8 C) (Temporal)   Resp 16   Wt 243 lb 14.4 oz (110.6 kg)   SpO2 97%   BMI 39.97 kg/m  Physical Exam Constitutional:      Appearance: Normal appearance.  HENT:     Head: Normocephalic and atraumatic.     Comments: Pharynx normal, eyes normal, ears: Blue PE tube noted in right ear, left ear normal, nose: Bilateral lower turbinates moderately edematous and slightly erythematous.  Right turbinate greater than left turbinate    Right Ear: Ear canal and external ear normal.     Left Ear: Tympanic membrane, ear canal and external ear normal.     Mouth/Throat:     Mouth: Mucous membranes are moist.     Pharynx: Oropharynx is clear.  Eyes:     Conjunctiva/sclera: Conjunctivae normal.  Cardiovascular:     Rate and Rhythm: Regular rhythm.     Heart sounds: Normal heart sounds.   Pulmonary:     Effort: Pulmonary effort is normal.     Breath sounds: Normal breath sounds.     Comments: Lungs clear to auscultation Musculoskeletal:     Cervical back: Neck supple.  Skin:    General: Skin is warm.  Neurological:     Mental Status: She is alert and oriented to person, place, and time.  Psychiatric:        Mood and Affect: Mood normal.        Behavior: Behavior normal.        Thought Content: Thought content normal.        Judgment: Judgment normal.    Diagnostics: FVC 3.03 L (110%), FEV1 2.29 L (107%) spirometry decays normal spirometry  Assessment and Plan: 1. Moderate persistent asthma without complication   2. Other allergic rhinitis     No orders of the defined types were placed in this encounter.   Patient Instructions  Moderate persistent asthma Continue Breo 200 mcg 1 puff once  a day to help prevent cough and wheeze. Rinse mouth out afterwards.  Due to your eye history we cannot use triple therapy for your asthma.  Continue montelukast 10 mg daily at bedtime Make sure and take this every day. This can also help with your allergies  May use albuterol 2 puffs every 4-6 hours if needed for cough, wheeze, tightness in chest, or shortness of breath OR albuterol via nebulizer 1 unit dose via nebulizer every 4-6 hours as needed for cough, wheeze, tightness in chest, or shortness of breath. If your breathing does not get better we do have the option of starting a biologic in the future. AEC  was 400 on 04/03/21..  Asthma control goals:  Full participation in all desired activities (may need albuterol before activity) Albuterol use two time or less a week on average (not counting use with activity) Cough interfering with sleep two time or less a month Oral steroids no more than once a year No hospitalizations    Other allergic rhinitis Continue azelastine nasal spray, 1-2 sprays per nostril 2 times daily as needed for runny nose/drainage down  throat Nasal saline spray (i.e., Simply Saline)  as needed and prior to medicated nasal sprays. Continue loratadine 10 mg once a day as needed for runny nose. Continue Singulair 10 mg as above .  Schedule a follow up appointment in 3 months or sooner if needed  Please let us know if this treatment plan is not working well for you  Return in about 3 months (around 05/27/2022), or if symptoms worsen or fail to improve.    Thank you for the opportunity to care for this patient.  Please do not hesitate to contact  me with questions.  Althea Charon, FNP [facility]

## 2022-05-09 DIAGNOSIS — Z0000 Encounter for general adult medical examination without abnormal findings: Secondary | ICD-10-CM | POA: Diagnosis not present

## 2022-05-09 DIAGNOSIS — E7800 Pure hypercholesterolemia, unspecified: Secondary | ICD-10-CM | POA: Diagnosis not present

## 2022-05-09 DIAGNOSIS — E039 Hypothyroidism, unspecified: Secondary | ICD-10-CM | POA: Diagnosis not present

## 2022-05-27 ENCOUNTER — Ambulatory Visit: Payer: 59 | Admitting: Family

## 2022-06-01 NOTE — Patient Instructions (Incomplete)
Moderate persistent asthma Continue Breo 200 mcg 1 puff once  a day to help prevent cough and wheeze. Rinse mouth out afterwards.  Due to your eye history we cannot use triple therapy for your asthma.  Continue montelukast 10 mg daily at bedtime Make sure and take this every day. This can also help with your allergies  May use albuterol 2 puffs every 4-6 hours if needed for cough, wheeze, tightness in chest, or shortness of breath OR albuterol via nebulizer 1 unit dose via nebulizer every 4-6 hours as needed for cough, wheeze, tightness in chest, or shortness of breath. If your breathing does not get better we do have the option of starting a biologic in the future. AEC  was 400 on 04/03/21..  Asthma control goals:  Full participation in all desired activities (may need albuterol before activity) Albuterol use two time or less a week on average (not counting use with activity) Cough interfering with sleep two time or less a month Oral steroids no more than once a year No hospitalizations    Other allergic rhinitis Continue azelastine nasal spray, 1-2 sprays per nostril 2 times daily as needed for runny nose/drainage down throat Nasal saline spray (i.e., Simply Saline)  as needed and prior to medicated nasal sprays. Continue loratadine 10 mg once a day as needed for runny nose. Continue Singulair 10 mg as above .  Schedule a follow up appointment in months or sooner if needed  Please let us know if this treatment plan is not working well for you 

## 2022-06-03 ENCOUNTER — Other Ambulatory Visit: Payer: Self-pay

## 2022-06-03 ENCOUNTER — Ambulatory Visit (INDEPENDENT_AMBULATORY_CARE_PROVIDER_SITE_OTHER): Payer: BC Managed Care – PPO | Admitting: Family

## 2022-06-03 ENCOUNTER — Encounter: Payer: Self-pay | Admitting: Family

## 2022-06-03 VITALS — BP 138/90 | HR 65 | Temp 98.0°F | Resp 16 | Wt 244.8 lb

## 2022-06-03 DIAGNOSIS — K219 Gastro-esophageal reflux disease without esophagitis: Secondary | ICD-10-CM

## 2022-06-03 DIAGNOSIS — J3089 Other allergic rhinitis: Secondary | ICD-10-CM

## 2022-06-03 DIAGNOSIS — J4540 Moderate persistent asthma, uncomplicated: Secondary | ICD-10-CM

## 2022-06-03 MED ORDER — FAMOTIDINE 20 MG PO TABS: ORAL_TABLET | ORAL | 1 refills | Status: DC

## 2022-06-03 NOTE — Progress Notes (Signed)
[address] [place] [address] Dept: [phone]  FOLLOW UP NOTE  Patient ID: Angela [name], female    DOB: Jan 26, 1956  Age: 67 y.o. MRN: [id number] Date of Office Visit: 06/03/2022  Assessment  Chief Complaint: Follow-up  HPI Angela [name] is a 67 year old female who presents today for follow-up of moderate persistent asthma without complication and allergic rhinitis.  She was last seen on February 25, 2022 by myself.  She denies any new diagnosis or surgery since her last office visit.  Moderate persistent asthma: Continues to take Breo 200 mcg 1 puff once a day, montelukast 10 mg once a day, and albuterol as needed.  She reports her asthma is not as bad as it was, but it is not what she wants it to be.  She reports coughing fits that occur almost daily.  The cough can occur at night also.  What she coughs up is foamy and clear in color.  She also has wheezing at times and tightness in her chest at times.  When she does use her albuterol it does help.  She is using her albuterol approximately 3-4 times a week.  Her wheezing does occasionally wake her up at night.  She denies fever and shortness of breath.  Perfumes and scents sometimes will bother her asthma also.  Since her last office visit she has not required any systemic steroids for her asthma, but she does use eyedrops.  She has also not made any trips to the emergency room or urgent care due to breathing problems.  Allergic rhinitis: She continues to take azelastine nasal spray at night, Singulair 10 mg once a day, and loratadine as needed.  She reports rhinorrhea during the day, nasal congestion in the morning, and some postnasal drip.  She has not been treated for any sinus infections since we last saw her.  She reports in the past she has used Nasacort nasal spray and wonders if she can use this again.   The other day she had some pressure in front of her right ear and then it drained. The draining from the right hear has  stopped now.  She does have a PE tube in this ear.  She is going to schedule an appointment with her ENT to discuss.  Reflux: She reports reflux symptoms approximately 3 times a week for which she takes Tums.  She knows her reflux is due to her diet and eating late.   Drug Allergies:  Allergies  Allergen Reactions   Other Other (See Comments)    NO BLOOD PRODUCTS - PT IN AGREEMENT FOR ALBUMIN OR ALBUMIN CONTAINING PRODUCTS PER CONSENT   Aspirin Other (See Comments)    Stomach ulcers    Ibuprofen Other (See Comments)    Cautious by MD due to kidney function     Review of Systems: Review of Systems  Constitutional:  Negative for chills and fever.  HENT:         Reports rhinorrhea during the day, nasal congestion in the morning, and some postnasal drip.  Eyes:        Denies itchy watery eyes  Respiratory:  Positive for cough and wheezing. Negative for shortness of breath.        Reports coughing fits that can be foamy with clear sputum.  The cough occurs almost daily and can occur at night.  She also has wheezing and some tightness in her chest.  She denies shortness of breath.  Cardiovascular:  Negative  for chest pain and palpitations.  Gastrointestinal:        Reports reflux symptoms approximately 3 times a week. Takes Tums  Skin:  Negative for itching and rash.  Neurological:  Negative for headaches.     Physical Exam: BP (!) 138/90   Pulse 65   Temp 98 F (36.7 C) (Temporal)   Resp 16   Wt 244 lb 12.8 oz (111 kg)   SpO2 99%   BMI 40.12 kg/m    Physical Exam Constitutional:      Appearance: Normal appearance.  HENT:     Head: Normocephalic and atraumatic.     Comments: Pharynx normal, eyes normal, ears: left ear normal, right ear with blue PE tube and no drainage. Nose: Bilateral lower turbinates mildly edematous with no drainage noted    Right Ear: Ear canal and external ear normal.     Left Ear: Tympanic membrane, ear canal and external ear normal.      Mouth/Throat:     Mouth: Mucous membranes are moist.     Pharynx: Oropharynx is clear.  Eyes:     Conjunctiva/sclera: Conjunctivae normal.  Cardiovascular:     Rate and Rhythm: Regular rhythm.     Heart sounds: Normal heart sounds.  Pulmonary:     Effort: Pulmonary effort is normal.     Breath sounds: Normal breath sounds.     Comments: Lungs clear to auscultation Musculoskeletal:     Cervical back: Neck supple.  Skin:    General: Skin is warm.  Neurological:     Mental Status: She is alert and oriented to person, place, and time.  Psychiatric:        Mood and Affect: Mood normal.        Behavior: Behavior normal.        Thought Content: Thought content normal.        Judgment: Judgment normal.     Diagnostics: FVC 3.11 L (114%), FEV1 2.42 L (113%).  Spirometry indicates normal spirometry.  Assessment and Plan: 1. Not well controlled moderate persistent asthma   2. Other allergic rhinitis   3. Gastroesophageal reflux disease, unspecified whether esophagitis present     Meds ordered this encounter  Medications   famotidine (PEPCID) 20 MG tablet    Sig: Take one tablet daily- 30 minutes before dinner    Dispense:  30 tablet    Refill:  1    Patient Instructions  Moderate persistent asthma Continue Breo 200 mcg 1 puff once  a day to help prevent cough and wheeze. Rinse mouth out afterwards.  Due to your eye history we cannot use triple therapy for your asthma.  Continue montelukast 10 mg daily at bedtime Make sure and take this every day. This can also help with your allergies  May use albuterol 2 puffs every 4-6 hours if needed for cough, wheeze, tightness in chest, or shortness of breath OR albuterol via nebulizer 1 unit dose via nebulizer every 4-6 hours as needed for cough, wheeze, tightness in chest, or shortness of breath.  AEC  was 400 on 04/03/21. Will order a cbc with diff to update your eosinophil count. We will call you with results once they are all back.  Information given on Reunion, and Dupxient today.  Asthma control goals:  Full participation in all desired activities (may need albuterol before activity) Albuterol use two time or less a week on average (not counting use with activity) Cough interfering with sleep two time or less a  month Oral steroids no more than once a year No hospitalizations    Other allergic rhinitis Continue azelastine nasal spray, 1-2 sprays per nostril 2 times daily as needed for runny nose/drainage down throat Nasal saline spray (i.e., Simply Saline)  as needed and prior to medicated nasal sprays. Continue loratadine 10 mg once a day as needed for runny nose. Continue Singulair 10 mg as above Ask your eye doctor, Dr. Dione Booze, whether it is ok to start a steroid nasal spray  Reflux Start Pepcid (famotidine) 20 mg at night. Take 30 minutes before dinner Start dietary and lifestyle modifications as below  Your blood pressure was elevated in the office today. Please schedule an appointment to discuss with your primary care physician. Schedule a follow up appointment in 4-6 weeks or sooner if needed  Please let us know if this treatment plan is not working well for you  Lifestyle Changes for Controlling GERD When you have GERD, stomach acid feels as if it's backing up toward your mouth. Whether or not you take medication to control your GERD, your symptoms can often be improved with lifestyle changes.   Raise Your Head Reflux is more likely to strike when you're lying down flat, because stomach fluid can flow backward more easily. Raising the head of your bed 4-6 inches can help. To do this: Slide blocks or books under the legs at the head of your bed. Or, place a wedge under the mattress. Many foam stores can make a suitable wedge for you. The wedge should run from your waist to the top of your head. Don't just prop your head on several pillows. This increases pressure on your stomach. It can make  GERD worse.  Watch Your Eating Habits Certain foods may increase the acid in your stomach or relax the lower esophageal sphincter, making GERD more likely. It's best to avoid the following: Coffee, tea, and carbonated drinks (with and without caffeine) Fatty, fried, or spicy food Mint, chocolate, onions, and tomatoes Any other foods that seem to irritate your stomach or cause you pain  Relieve the Pressure Eat smaller meals, even if you have to eat more often. Don't lie down right after you eat. Wait a few hours for your stomach to empty. Avoid tight belts and tight-fitting clothes. Lose excess weight.  Tobacco and Alcohol Avoid smoking tobacco and drinking alcohol. They can make GERD symptoms worse.  Return in about 4 weeks (around 07/01/2022), or if symptoms worsen or fail to improve.    Thank you for the opportunity to care for this patient.  Please do not hesitate to contact me with questions.  Nehemiah Settle, FNP [facility]

## 2022-06-04 LAB — CBC WITH DIFFERENTIAL
Basophils Absolute: 0.1 10*3/uL (ref 0.0–0.2)
Basos: 1 %
EOS (ABSOLUTE): 0.2 10*3/uL (ref 0.0–0.4)
Eos: 2 %
Hematocrit: 44.2 % (ref 34.0–46.6)
Hemoglobin: 13.6 g/dL (ref 11.1–15.9)
Immature Grans (Abs): 0 10*3/uL (ref 0.0–0.1)
Immature Granulocytes: 0 %
Lymphocytes Absolute: 2.1 10*3/uL (ref 0.7–3.1)
Lymphs: 25 %
MCH: 23.1 pg — ABNORMAL LOW (ref 26.6–33.0)
MCHC: 30.8 g/dL — ABNORMAL LOW (ref 31.5–35.7)
MCV: 75 fL — ABNORMAL LOW (ref 79–97)
Monocytes Absolute: 0.5 10*3/uL (ref 0.1–0.9)
Monocytes: 6 %
Neutrophils Absolute: 5.4 10*3/uL (ref 1.4–7.0)
Neutrophils: 66 %
RBC: 5.89 x10E6/uL — ABNORMAL HIGH (ref 3.77–5.28)
RDW: 15.7 % — ABNORMAL HIGH (ref 11.7–15.4)
WBC: 8.2 10*3/uL (ref 3.4–10.8)

## 2022-06-11 NOTE — Progress Notes (Signed)
Please let Tenisha know that we received her lab work.  It looks like she might have iron deficiency anemia. Has she ever been diagnosed with this?  Please send a copy of her lab results to her primary care physician.  Her lab work also shows that she has elevated eosinophils. As you may remember we talked about how elevated eosinophils can cause some asthmatics asthma to flare. Has she looked into the information I gave her about Eliberto Ivory, or Dupixent? Is she interested in starting one of these biologics?  Nehemiah Settle, FNP [facility]

## 2022-06-13 ENCOUNTER — Other Ambulatory Visit: Payer: Self-pay

## 2022-06-17 ENCOUNTER — Other Ambulatory Visit: Payer: Self-pay | Admitting: Family

## 2022-06-17 DIAGNOSIS — H40022 Open angle with borderline findings, high risk, left eye: Secondary | ICD-10-CM | POA: Diagnosis not present

## 2022-06-17 DIAGNOSIS — H2011 Chronic iridocyclitis, right eye: Secondary | ICD-10-CM | POA: Diagnosis not present

## 2022-06-17 DIAGNOSIS — H02421 Myogenic ptosis of right eyelid: Secondary | ICD-10-CM | POA: Diagnosis not present

## 2022-06-17 DIAGNOSIS — H401113 Primary open-angle glaucoma, right eye, severe stage: Secondary | ICD-10-CM | POA: Diagnosis not present

## 2022-06-21 ENCOUNTER — Telehealth: Payer: Self-pay

## 2022-06-21 ENCOUNTER — Other Ambulatory Visit (HOSPITAL_COMMUNITY): Payer: Self-pay

## 2022-06-21 NOTE — Telephone Encounter (Signed)
Patient Advocate Encounter  Prior Authorization for Breo Ellipta 200-25MCG/ACT aerosol powder has been approved through [employer].    KeyAlfonzo [name]  Effective: 06-21-2022 to 06-21-2023

## 2022-07-08 ENCOUNTER — Other Ambulatory Visit: Payer: Self-pay | Admitting: Family Medicine

## 2022-07-08 DIAGNOSIS — Z1231 Encounter for screening mammogram for malignant neoplasm of breast: Secondary | ICD-10-CM

## 2022-07-09 DIAGNOSIS — R5383 Other fatigue: Secondary | ICD-10-CM | POA: Diagnosis not present

## 2022-07-09 DIAGNOSIS — Z1211 Encounter for screening for malignant neoplasm of colon: Secondary | ICD-10-CM | POA: Diagnosis not present

## 2022-07-09 DIAGNOSIS — R7989 Other specified abnormal findings of blood chemistry: Secondary | ICD-10-CM | POA: Diagnosis not present

## 2022-07-09 DIAGNOSIS — R799 Abnormal finding of blood chemistry, unspecified: Secondary | ICD-10-CM | POA: Diagnosis not present

## 2022-07-09 DIAGNOSIS — E039 Hypothyroidism, unspecified: Secondary | ICD-10-CM | POA: Diagnosis not present

## 2022-07-09 DIAGNOSIS — G4733 Obstructive sleep apnea (adult) (pediatric): Secondary | ICD-10-CM | POA: Diagnosis not present

## 2022-07-23 ENCOUNTER — Ambulatory Visit
Admission: RE | Admit: 2022-07-23 | Discharge: 2022-07-23 | Disposition: A | Discharge status: Home / Self Care | Payer: BC Managed Care – PPO | Source: Ambulatory Visit | Attending: Family Medicine | Admitting: Family Medicine

## 2022-07-23 DIAGNOSIS — Z1231 Encounter for screening mammogram for malignant neoplasm of breast: Secondary | ICD-10-CM

## 2022-08-26 DIAGNOSIS — E039 Hypothyroidism, unspecified: Secondary | ICD-10-CM | POA: Diagnosis not present

## 2022-08-26 DIAGNOSIS — G4733 Obstructive sleep apnea (adult) (pediatric): Secondary | ICD-10-CM | POA: Diagnosis not present

## 2022-08-26 DIAGNOSIS — R030 Elevated blood-pressure reading, without diagnosis of hypertension: Secondary | ICD-10-CM | POA: Diagnosis not present

## 2022-09-10 DIAGNOSIS — E039 Hypothyroidism, unspecified: Secondary | ICD-10-CM | POA: Diagnosis not present

## 2022-09-10 DIAGNOSIS — G4733 Obstructive sleep apnea (adult) (pediatric): Secondary | ICD-10-CM | POA: Diagnosis not present

## 2022-09-23 ENCOUNTER — Telehealth: Payer: Self-pay | Admitting: Family

## 2022-09-23 MED ORDER — BREO ELLIPTA 200-25 MCG/ACT IN AEPB: 1.0000 | INHALATION_SPRAY | Freq: Every day | RESPIRATORY_TRACT | 1 refills | Status: DC

## 2022-09-23 NOTE — Telephone Encounter (Signed)
Patient is requesting refill for [facility] It is cheaper for patient if she gets it from there.   Patient requesting call once prescription sent in, [phone]

## 2022-09-23 NOTE — Telephone Encounter (Signed)
Sent in breo 200 3 month supply [facility] pt informed of me doing soo

## 2022-10-11 DIAGNOSIS — G4733 Obstructive sleep apnea (adult) (pediatric): Secondary | ICD-10-CM | POA: Diagnosis not present

## 2022-10-14 DIAGNOSIS — E039 Hypothyroidism, unspecified: Secondary | ICD-10-CM | POA: Diagnosis not present

## 2022-10-23 DIAGNOSIS — H40022 Open angle with borderline findings, high risk, left eye: Secondary | ICD-10-CM | POA: Diagnosis not present

## 2022-10-23 DIAGNOSIS — H02421 Myogenic ptosis of right eyelid: Secondary | ICD-10-CM | POA: Diagnosis not present

## 2022-10-23 DIAGNOSIS — H401113 Primary open-angle glaucoma, right eye, severe stage: Secondary | ICD-10-CM | POA: Diagnosis not present

## 2022-10-23 DIAGNOSIS — H2011 Chronic iridocyclitis, right eye: Secondary | ICD-10-CM | POA: Diagnosis not present

## 2022-10-25 DIAGNOSIS — G4733 Obstructive sleep apnea (adult) (pediatric): Secondary | ICD-10-CM | POA: Diagnosis not present

## 2022-11-07 ENCOUNTER — Inpatient Hospital Stay: Payer: BC Managed Care – PPO | Attending: Hematology and Oncology | Admitting: Hematology and Oncology

## 2022-11-07 VITALS — BP 134/103 | HR 77 | Temp 97.8°F | Resp 18 | Ht 65.5 in | Wt 245.0 lb

## 2022-11-07 DIAGNOSIS — R718 Other abnormality of red blood cells: Secondary | ICD-10-CM | POA: Diagnosis not present

## 2022-11-07 DIAGNOSIS — Z9012 Acquired absence of left breast and nipple: Secondary | ICD-10-CM | POA: Diagnosis not present

## 2022-11-07 DIAGNOSIS — G4730 Sleep apnea, unspecified: Secondary | ICD-10-CM | POA: Insufficient documentation

## 2022-11-07 DIAGNOSIS — C50312 Malignant neoplasm of lower-inner quadrant of left female breast: Secondary | ICD-10-CM | POA: Diagnosis not present

## 2022-11-07 DIAGNOSIS — Z79811 Long term (current) use of aromatase inhibitors: Secondary | ICD-10-CM | POA: Diagnosis not present

## 2022-11-07 DIAGNOSIS — Z170 Estrogen receptor positive status [ER+]: Secondary | ICD-10-CM | POA: Insufficient documentation

## 2022-11-07 DIAGNOSIS — R5383 Other fatigue: Secondary | ICD-10-CM | POA: Insufficient documentation

## 2022-11-07 MED ORDER — LETROZOLE 2.5 MG PO TABS: 2.5000 mg | ORAL_TABLET | Freq: Every day | ORAL | 3 refills | Status: DC

## 2022-11-07 NOTE — Progress Notes (Signed)
Patient Care Team: Clovis Riley, L.August Saucer, MD as PCP - General (Family Medicine) Emelia Loron, MD as Consulting Physician (General Surgery) Serena Croissant, MD as Consulting Physician (Hematology and Oncology) Antony Blackbird, MD as Consulting Physician (Radiation Oncology) Glenna Fellows, MD as Consulting Physician (Plastic Surgery)  DIAGNOSIS:  Encounter Diagnosis  Name Primary?   Malignant neoplasm of lower-inner quadrant of left breast in female, estrogen receptor positive (HCC) Yes    SUMMARY OF ONCOLOGIC HISTORY: Oncology History  Malignant neoplasm of lower-inner quadrant of left breast in female, estrogen receptor positive (HCC)  07/14/2017 Initial Diagnosis   Screening detected left breast asymmetry at 6 o'clock position, could not be measured accurately.  Axillary ultrasound was not performed.  Biopsy of the asymmetry revealed IDC grade 1, ER 95%, PR 60%, Ki-67 2%, HER-2 negative ratio 1.52, T1 NX stage Ia probable clinical stage   07/30/2017 Breast MRI   Biopsy-proven left breast 4.4 cm with non-mass enhancement overall extent 5.5 cm.  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 She has been on letrozole for the duration of her remission and reports occasional hot flashes and joint stiffness when she misses doses. She also reports a persistent itch in the nipple of the right breast, which has been evaluated with a recent mammogram that showed a breast density of category C.  The patient also reports fatigue, which she is being treated for by another physician. She has been told that her red blood cells are small, but she is not anemic. She also has thyroid issues and sleep apnea, which she has recently started treatment for. She is concerned about the potential impact of these conditions on her overall health and is seeking reassurance and guidance.        ALLERGIES:  is allergic to other, aspirin, and ibuprofen.  MEDICATIONS:  Current Outpatient Medications  Medication Sig Dispense Refill   acetaminophen (TYLENOL) 500 MG tablet Take 1,000 mg by mouth every 6 (six) hours as needed for moderate pain or headache.     albuterol (PROVENTIL) (2.5 MG/3ML) 0.083% nebulizer solution Take 3 mLs (2.5 mg total) by nebulization every 6 (six) hours as needed for wheezing or shortness of breath. 75 mL 0   albuterol (VENTOLIN HFA) 108 (90 Base) MCG/ACT inhaler Inhale 2 puffs into the lungs every 6 (six) hours as needed for wheezing or shortness of breath. 18 g 1   azelastine (ASTELIN) 0.1 % nasal spray 1-2 sprays each nostril 2 times daily as needed 30 mL 5   BREO ELLIPTA 200-25 MCG/ACT AEPB Inhale 1 puff into the lungs daily. 120 each 1   diphenhydrAMINE (BENADRYL) 25 MG tablet Take  25 mg by mouth every 6 (six) hours as needed for itching or allergies.     dorzolamide-timolol (COSOPT) 22.3-6.8 MG/ML ophthalmic solution Place 1 drop into the left eye 2 (two) times daily. 10 mL 4   famotidine (PEPCID) 20 MG tablet TAKE ONE TABLET DAILY- 30 MINUTES BEFORE DINNER 90 tablet 1   levothyroxine (SYNTHROID) 137 MCG tablet Take 1 tablet (137  mcg total) by mouth daily before breakfast. (Patient taking differently: Take 125 mcg by mouth daily before breakfast. Alternating between 125/112)     loratadine (CLARITIN) 10 MG tablet Take 10 mg by mouth daily.     montelukast (SINGULAIR) 10 MG tablet Take 1 tablet (10 mg total) by mouth at bedtime. 30 tablet 5   triamcinolone ointment (KENALOG) 0.5 % Apply 1 application topically 2 (two) times daily. 30 g 0   letrozole (FEMARA) 2.5 MG tablet Take 1 tablet (2.5 mg total) by mouth daily. 90 tablet 3   No current facility-administered medications for this visit.    PHYSICAL EXAMINATION: ECOG PERFORMANCE STATUS: 1 - Symptomatic but completely ambulatory  Vitals:   11/07/22 0837 11/07/22 0838  BP: (!) 134/103 (!) 134/103  Pulse: 77   Resp: 18   Temp: 97.8 F (36.6 C)   SpO2: 100%    Filed Weights   11/07/22 0837  Weight: 245 lb (111.1 kg)    Physical Exam   SKIN: No eczema, scar tissue present, tenderness upon palpation at the center of the nipple.   Breast exam: Benign  (exam performed in the presence of a chaperone)  LABORATORY DATA:  I have reviewed the data as listed    Latest Ref Rng & Units 05/21/2019   11:11 AM 05/21/2019   10:35 AM 07/23/2017    8:09 AM  CMP  Glucose 70 - 99 mg/dL  89  95   BUN 8 - 23 mg/dL  13  14   Creatinine 1.61 - 1.00 mg/dL  0.96  0.45   Sodium 409 - 145 mmol/L 147  142  144   Potassium 3.5 - 5.1 mmol/L 2.9  3.0  4.0   Chloride 98 - 111 mmol/L  113  109   CO2 22 - 32 mmol/L  23  24   Calcium 8.9 - 10.3 mg/dL  7.4  9.6   Total Protein 6.5 - 8.1 g/dL   7.7   Total Bilirubin 0.3 - 1.2 mg/dL   0.2   Alkaline Phos 38 - 126 U/L   79   AST 15 - 41 U/L   11   ALT 0 - 44 U/L   12     Lab Results  Component Value Date   WBC 8.2 06/03/2022   HGB 13.6 06/03/2022   HCT 44.2 06/03/2022   MCV 75 (L) 06/03/2022   PLT 263 05/21/2019   NEUTROABS 5.4 06/03/2022    ASSESSMENT & PLAN:  Malignant neoplasm of lower-inner quadrant of left breast in  female, estrogen receptor positive (HCC) 07/14/2017:Screening detected left breast asymmetry at 6 o'clock position, could not be measured accurately.  Axillary ultrasound was not performed.  Biopsy of the asymmetry revealed IDC grade 1, ER 95%, PR 60%, Ki-67 2%, HER-2 negative ratio 1.52, T1 NX stage Ia probable clinical stage   Additional biopsies left breast showed DCIS Left axillary lymph node biopsy came back benign although there was no lymphoid tissue. Oncotype DX score 12: 3% risk of distant recurrence of 9 years, no chemo benefit Left mastectomy with reconstruction on 01/06/2018:  IDC, 3 cm, margins negative, 0/1 lymph node negative, grade 2, ER 100%, PR 0%, Ki-67 3%   Current treatment: Adjuvant antiestrogen therapy with letrozole (letrozole was started neoadjuvantly in July 2019) Letrozole toxicities: Denies any adverse effects to letrozole.   Breast cancer surveillance: 1.  Breast exam 11/07/2022: Benign 2.  Mammogram right breast: 07/25/2022: Benign breast density category C  Fatigue issues: Most likely from sleep apnea.  She pointed to me that she has microcytosis.  I suspect that she has beta thalassemia.  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  Patient assured me that next year when she comes she is going to lose weight. 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 Not the cause of patient's fatigue. -No further intervention needed at this time.  Fatigue Likely multifactorial, including sleep apnea, thyroid issues, and lack of physical activity. Patient is being treated for sleep apnea and is planning to increase physical activity. -Encourage continued treatment for sleep apnea and increased physical activity.  Weight Management Patient acknowledges need for weight loss and  has a plan to increase physical activity. -Encourage continued efforts towards weight loss and increased physical activity. Follow up on progress at next visit.     Return to clinic in 1 year and we will decide on continuing antiestrogen therapy or stopping it at that time.     No orders of the defined types were placed in this encounter.  The patient has a good understanding of the overall plan. she agrees with it. she will call with any problems that may develop before the next visit here. Total time spent: 30 mins including face to face time and time spent for planning, charting and co-ordination of care   Tamsen Meek, MD 11/07/22

## 2022-11-07 NOTE — Assessment & Plan Note (Addendum)
07/14/2017:Screening detected left breast asymmetry at 6 o'clock position, could not be measured accurately.  Axillary ultrasound was not performed.  Biopsy of the asymmetry revealed IDC grade 1, ER 95%, PR 60%, Ki-67 2%, HER-2 negative ratio 1.52, T1 NX stage Ia probable clinical stage   Additional biopsies left breast showed DCIS Left axillary lymph node biopsy came back benign although there was no lymphoid tissue. Oncotype DX score 12: 3% risk of distant recurrence of 9 years, no chemo benefit Left mastectomy with reconstruction on 01/06/2018: IDC, 3 cm, margins negative, 0/1 lymph node negative, grade 2, ER 100%, PR 0%, Ki-67 3%   Current treatment: Adjuvant antiestrogen therapy with letrozole (letrozole was started neoadjuvantly in July 2019) Letrozole toxicities: Denies any adverse effects to letrozole.   Breast cancer surveillance: 1.  Breast exam 11/07/2022: Benign 2.  Mammogram right breast: 07/25/2022: Benign breast density category C  Fatigue issues: Most likely from sleep apnea.  She pointed to me that she has microcytosis.  I suspect that she has beta thalassemia.  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  Patient assured me that next year when she comes she is going to lose weight. Return to clinic in 1 year for follow-up

## 2022-11-10 DIAGNOSIS — G4733 Obstructive sleep apnea (adult) (pediatric): Secondary | ICD-10-CM | POA: Diagnosis not present

## 2022-11-30 ENCOUNTER — Other Ambulatory Visit: Payer: Self-pay | Admitting: Family

## 2022-12-11 DIAGNOSIS — G4733 Obstructive sleep apnea (adult) (pediatric): Secondary | ICD-10-CM | POA: Diagnosis not present

## 2022-12-17 DIAGNOSIS — G4733 Obstructive sleep apnea (adult) (pediatric): Secondary | ICD-10-CM | POA: Diagnosis not present

## 2022-12-18 ENCOUNTER — Other Ambulatory Visit: Payer: Self-pay | Admitting: *Deleted

## 2022-12-18 MED ORDER — LETROZOLE 2.5 MG PO TABS: 2.5000 mg | ORAL_TABLET | Freq: Every day | ORAL | 3 refills | Status: AC

## 2023-01-06 ENCOUNTER — Other Ambulatory Visit: Payer: Self-pay | Admitting: Family

## 2023-01-06 NOTE — Telephone Encounter (Signed)
Called and left a message for patient to call our office back to inform her to schedule a follow up with a DOCTOR and receive a courtesy refill after doing so.

## 2023-01-10 ENCOUNTER — Other Ambulatory Visit: Payer: Self-pay | Admitting: Family

## 2023-01-10 DIAGNOSIS — G4733 Obstructive sleep apnea (adult) (pediatric): Secondary | ICD-10-CM | POA: Diagnosis not present

## 2023-01-12 NOTE — Patient Instructions (Incomplete)
Moderate persistent asthma-not well controlled- still deciding on Nucala, Fasenra or Dupixent Continue Breo 200 mcg 1 puff once  a day to help prevent cough and wheeze. Rinse mouth out afterwards.  Due to your eye history we cannot use triple therapy for your asthma.  Continue montelukast 10 mg daily at bedtime Make sure and take this every day. This can also help with your allergies  May use albuterol 2 puffs every 4-6 hours if needed for cough, wheeze, tightness in chest, or shortness of breath OR albuterol via nebulizer 1 unit dose via nebulizer every 4-6 hours as needed for cough, wheeze, tightness in chest, or shortness of breath.  AEC  was 200 on 06/03/22. Information given again on Reunion, and Dupxient today.  Asthma control goals:  Full participation in all desired activities (may need albuterol before activity) Albuterol use two time or less a week on average (not counting use with activity) Cough interfering with sleep two time or less a month Oral steroids no more than once a year No hospitalizations    Other allergic rhinitis-moderately controlled Continue azelastine nasal spray, 1-2 sprays per nostril 2 times daily as needed for runny nose/drainage down throat Nasal saline spray (i.e., Simply Saline)  as needed and prior to medicated nasal sprays. Continue loratadine 10 mg once a day as needed for runny nose. Continue Singulair 10 mg as above   Reflux-not well controlled Stop Pepcid (famotidine) Start omeprazole 20 mg at night. Take 30 minutes before dinner. Lets see if this helps decrease your symptoms Continue dietary and lifestyle modifications as below Decrease caffeine consumption  . Schedule a follow up appointment in 4 weeks or sooner if needed  Please let us know if this treatment plan is not working well for you  Lifestyle Changes for Controlling GERD When you have GERD, stomach acid feels as if it's backing up toward your mouth. Whether or not you  take medication to control your GERD, your symptoms can often be improved with lifestyle changes.   Raise Your Head Reflux is more likely to strike when you're lying down flat, because stomach fluid can flow backward more easily. Raising the head of your bed 4-6 inches can help. To do this: Slide blocks or books under the legs at the head of your bed. Or, place a wedge under the mattress. Many foam stores can make a suitable wedge for you. The wedge should run from your waist to the top of your head. Don't just prop your head on several pillows. This increases pressure on your stomach. It can make GERD worse.  Watch Your Eating Habits Certain foods may increase the acid in your stomach or relax the lower esophageal sphincter, making GERD more likely. It's best to avoid the following: Coffee, tea, and carbonated drinks (with and without caffeine) Fatty, fried, or spicy food Mint, chocolate, onions, and tomatoes Any other foods that seem to irritate your stomach or cause you pain  Relieve the Pressure Eat smaller meals, even if you have to eat more often. Don't lie down right after you eat. Wait a few hours for your stomach to empty. Avoid tight belts and tight-fitting clothes. Lose excess weight.  Tobacco and Alcohol Avoid smoking tobacco and drinking alcohol. They can make GERD symptoms worse.

## 2023-01-14 ENCOUNTER — Ambulatory Visit (INDEPENDENT_AMBULATORY_CARE_PROVIDER_SITE_OTHER): Payer: BC Managed Care – PPO | Admitting: Family

## 2023-01-14 ENCOUNTER — Other Ambulatory Visit: Payer: Self-pay

## 2023-01-14 ENCOUNTER — Encounter: Payer: Self-pay | Admitting: Family

## 2023-01-14 VITALS — BP 122/78 | HR 81 | Temp 97.9°F | Resp 18 | Ht 63.5 in | Wt 249.5 lb

## 2023-01-14 DIAGNOSIS — J4540 Moderate persistent asthma, uncomplicated: Secondary | ICD-10-CM

## 2023-01-14 DIAGNOSIS — K219 Gastro-esophageal reflux disease without esophagitis: Secondary | ICD-10-CM

## 2023-01-14 DIAGNOSIS — J3089 Other allergic rhinitis: Secondary | ICD-10-CM | POA: Diagnosis not present

## 2023-01-14 MED ORDER — BREO ELLIPTA 200-25 MCG/ACT IN AEPB: 1.0000 | INHALATION_SPRAY | Freq: Every day | RESPIRATORY_TRACT | 1 refills | Status: DC

## 2023-01-14 MED ORDER — OMEPRAZOLE 20 MG PO CPDR: 20.0000 mg | DELAYED_RELEASE_CAPSULE | Freq: Every day | ORAL | 1 refills | Status: DC

## 2023-01-14 MED ORDER — MONTELUKAST SODIUM 10 MG PO TABS: 10.0000 mg | ORAL_TABLET | Freq: Every day | ORAL | 5 refills | Status: DC

## 2023-01-14 MED ORDER — ALBUTEROL SULFATE (2.5 MG/3ML) 0.083% IN NEBU: 2.5000 mg | INHALATION_SOLUTION | Freq: Four times a day (QID) | RESPIRATORY_TRACT | 0 refills | Status: AC | PRN

## 2023-01-14 MED ORDER — ALBUTEROL SULFATE HFA 108 (90 BASE) MCG/ACT IN AERS: 2.0000 | INHALATION_SPRAY | Freq: Four times a day (QID) | RESPIRATORY_TRACT | 1 refills | Status: DC | PRN

## 2023-01-14 NOTE — Progress Notes (Signed)
[address] [place] [address] Dept: [phone]  FOLLOW UP NOTE  Patient ID: Angela [name], female    DOB: 1955-03-17  Age: 67 y.o. MRN: [id number] Date of Office Visit: 01/14/2023  Assessment  Chief Complaint: Asthma and Cough  HPI Angela [name] is a 67 year old female who presents today for follow-up of not well-controlled moderate persistent asthma, allergic rhinitis, and gastroesophageal reflux disease.  She was last seen on Jun 03, 2022 by myself.  She reports since her last office visit she has been dealing with bouts of fatigue found that her red blood cells were too small.  She was diagnosed with thalassemia.  Moderate persistent asthma: She reports that she still has a cough with white mucus as the day goes on.  The productive cough does not occur every day, but maybe a few times a week she will try taking a cough syrup to stop it, but does not want to keep on taking the cough syrup.  She also reports wheezing some, but is not as bad as it used to be.  She has no wheezing maybe a couple times a night.  She reports that wheezing used to wake her up at night but it is not.  The tightness in her chest has gotten better since using her CPAP.  She has shortness of breath with coughing fits.  She also reports no energy to walk like she used to.  She denies fever or chills.  She reports when she uses her albuterol it does help with the cough.  She uses her albuterol maybe once a week.  She did look into the information on Reunion and Dupixent that was given at her last office, but did not want to add on any medications at the time due to her recent fatigue.  She wonders if Paulene Floor could be beneficial to her regimen.  She continues to take Breo 200 mcg once a day, montelukast 10 mg once a day and albuterol as needed.  She has a past medical history of retina surgery and reports that she has glaucoma.  Allergic rhinitis: She reports nasal congestion at night and denies  rhinorrhea and postnasal drip.  She is currently using azelastine spray and reports that it helps with her CPAP usage.  She continues to take Singulair 10 mg a day and loratadine 10 mg as needed.  She has not been treated for any sinus infections since we last saw her.  Reflux: She continues to take famotidine 20 mg most days of the week.  She does mention that it helps she takes it.  She knows that she needs to watch her diet better.  She will have symptoms of reflux approximately 4 days a week.  Salads will cause her to have reflux symptoms.  She drinks a couple cups of coffee a day and eats very little chocolate.  She also drinks a cup of tea 3-4 times a week.   Drug Allergies:  Allergies  Allergen Reactions   Other Other (See Comments)    NO BLOOD PRODUCTS - PT IN AGREEMENT FOR ALBUMIN OR ALBUMIN CONTAINING PRODUCTS PER CONSENT   Aspirin Other (See Comments)    Stomach ulcers    Ibuprofen Other (See Comments)    Cautious by MD due to kidney function     Review of Systems: Negative except as above   Physical Exam: BP 122/78 (BP Location: Left Arm, Patient Position: Sitting, Cuff Size: Large)   Pulse 81  Temp 97.9 F (36.6 C) (Temporal)   Resp 18   Ht 5' 3.5" (1.613 m)   Wt 249 lb 8 oz (113.2 kg)   SpO2 97%   BMI 43.50 kg/m    Physical Exam Constitutional:      Appearance: Normal appearance.  HENT:     Head: Normocephalic.     Comments: Pharynx normal. Eyes normal. Ears: left ear normal, right ear: blue PE tube, nose: bilateral lower turbinates mildly edematous with no drainage noted    Right Ear: Ear canal and external ear normal.     Left Ear: Tympanic membrane, ear canal and external ear normal.     Mouth/Throat:     Mouth: Mucous membranes are moist.     Pharynx: Oropharynx is clear.  Eyes:     Conjunctiva/sclera: Conjunctivae normal.  Cardiovascular:     Rate and Rhythm: Regular rhythm.     Heart sounds: Normal heart sounds.  Pulmonary:     Effort: Pulmonary  effort is normal.     Breath sounds: Normal breath sounds.     Comments: Lungs clear to auscultation Musculoskeletal:     Cervical back: Neck supple.  Skin:    General: Skin is warm.  Neurological:     Mental Status: She is alert and oriented to person, place, and time.  Psychiatric:        Mood and Affect: Mood normal.        Behavior: Behavior normal.        Thought Content: Thought content normal.        Judgment: Judgment normal.     Diagnostics: FVC 2.93 L (116%), FEV1 2.33 L (117%), FEV1/FVC 0.80.  Spirometry indicates normal spirometry.  Assessment and Plan: 1. Gastroesophageal reflux disease, unspecified whether esophagitis present   2. Not well controlled moderate persistent asthma   3. Other allergic rhinitis     Meds ordered this encounter  Medications   omeprazole (PRILOSEC) 20 MG capsule    Sig: Take 1 capsule (20 mg total) by mouth daily.    Dispense:  30 capsule    Refill:  1   BREO ELLIPTA 200-25 MCG/ACT AEPB    Sig: Inhale 1 puff into the lungs daily.    Dispense:  90 each    Refill:  1   albuterol (VENTOLIN HFA) 108 (90 Base) MCG/ACT inhaler    Sig: Inhale 2 puffs into the lungs every 6 (six) hours as needed for wheezing or shortness of breath.    Dispense:  18 g    Refill:  1   albuterol (PROVENTIL) (2.5 MG/3ML) 0.083% nebulizer solution    Sig: Take 3 mLs (2.5 mg total) by nebulization every 6 (six) hours as needed for wheezing or shortness of breath.    Dispense:  75 mL    Refill:  0   montelukast (SINGULAIR) 10 MG tablet    Sig: Take 1 tablet (10 mg total) by mouth at bedtime.    Dispense:  30 tablet    Refill:  5    Patient Instructions  Moderate persistent asthma-not well controlled- still deciding on Nucala, Fasenra or Dupixent Continue Breo 200 mcg 1 puff once  a day to help prevent cough and wheeze. Rinse mouth out afterwards.  Due to your eye history we cannot use triple therapy for your asthma.  Continue montelukast 10 mg daily at  bedtime Make sure and take this every day. This can also help with your allergies  May use albuterol 2 puffs  every 4-6 hours if needed for cough, wheeze, tightness in chest, or shortness of breath OR albuterol via nebulizer 1 unit dose via nebulizer every 4-6 hours as needed for cough, wheeze, tightness in chest, or shortness of breath.  AEC  was 200 on 06/03/22. Information given again on Reunion, and Dupxient today.  Asthma control goals:  Full participation in all desired activities (may need albuterol before activity) Albuterol use two time or less a week on average (not counting use with activity) Cough interfering with sleep two time or less a month Oral steroids no more than once a year No hospitalizations    Other allergic rhinitis-moderately controlled Continue azelastine nasal spray, 1-2 sprays per nostril 2 times daily as needed for runny nose/drainage down throat Nasal saline spray (i.e., Simply Saline)  as needed and prior to medicated nasal sprays. Continue loratadine 10 mg once a day as needed for runny nose. Continue Singulair 10 mg as above   Reflux-not well controlled Stop Pepcid (famotidine) Start omeprazole 20 mg at night. Take 30 minutes before dinner. Lets see if this helps decrease your symptoms Continue dietary and lifestyle modifications as below Decrease caffeine consumption  . Schedule a follow up appointment in 4 weeks or sooner if needed  Please let us know if this treatment plan is not working well for you  Lifestyle Changes for Controlling GERD When you have GERD, stomach acid feels as if it's backing up toward your mouth. Whether or not you take medication to control your GERD, your symptoms can often be improved with lifestyle changes.   Raise Your Head Reflux is more likely to strike when you're lying down flat, because stomach fluid can flow backward more easily. Raising the head of your bed 4-6 inches can help. To do this: Slide blocks  or books under the legs at the head of your bed. Or, place a wedge under the mattress. Many foam stores can make a suitable wedge for you. The wedge should run from your waist to the top of your head. Don't just prop your head on several pillows. This increases pressure on your stomach. It can make GERD worse.  Watch Your Eating Habits Certain foods may increase the acid in your stomach or relax the lower esophageal sphincter, making GERD more likely. It's best to avoid the following: Coffee, tea, and carbonated drinks (with and without caffeine) Fatty, fried, or spicy food Mint, chocolate, onions, and tomatoes Any other foods that seem to irritate your stomach or cause you pain  Relieve the Pressure Eat smaller meals, even if you have to eat more often. Don't lie down right after you eat. Wait a few hours for your stomach to empty. Avoid tight belts and tight-fitting clothes. Lose excess weight.  Tobacco and Alcohol Avoid smoking tobacco and drinking alcohol. They can make GERD symptoms worse.  Return in about 4 weeks (around 02/11/2023), or if symptoms worsen or fail to improve.    Thank you for the opportunity to care for this patient.  Please do not hesitate to contact me with questions.  Nehemiah Settle, FNP [facility]

## 2023-01-28 DIAGNOSIS — H2011 Chronic iridocyclitis, right eye: Secondary | ICD-10-CM | POA: Diagnosis not present

## 2023-01-28 DIAGNOSIS — H401113 Primary open-angle glaucoma, right eye, severe stage: Secondary | ICD-10-CM | POA: Diagnosis not present

## 2023-01-28 DIAGNOSIS — H40022 Open angle with borderline findings, high risk, left eye: Secondary | ICD-10-CM | POA: Diagnosis not present

## 2023-01-28 DIAGNOSIS — H43812 Vitreous degeneration, left eye: Secondary | ICD-10-CM | POA: Diagnosis not present

## 2023-02-09 ENCOUNTER — Other Ambulatory Visit: Payer: Self-pay | Admitting: Family

## 2023-02-10 DIAGNOSIS — G4733 Obstructive sleep apnea (adult) (pediatric): Secondary | ICD-10-CM | POA: Diagnosis not present

## 2023-02-24 DIAGNOSIS — G4733 Obstructive sleep apnea (adult) (pediatric): Secondary | ICD-10-CM | POA: Diagnosis not present

## 2023-02-26 ENCOUNTER — Other Ambulatory Visit: Payer: Self-pay | Admitting: Internal Medicine

## 2023-02-26 ENCOUNTER — Ambulatory Visit (INDEPENDENT_AMBULATORY_CARE_PROVIDER_SITE_OTHER): Payer: BC Managed Care – PPO | Admitting: Internal Medicine

## 2023-02-26 ENCOUNTER — Other Ambulatory Visit: Payer: Self-pay

## 2023-02-26 ENCOUNTER — Encounter: Payer: Self-pay | Admitting: Internal Medicine

## 2023-02-26 ENCOUNTER — Other Ambulatory Visit: Payer: Self-pay | Admitting: Obstetrics and Gynecology

## 2023-02-26 VITALS — BP 130/90 | HR 84 | Temp 98.3°F

## 2023-02-26 DIAGNOSIS — J4540 Moderate persistent asthma, uncomplicated: Secondary | ICD-10-CM | POA: Diagnosis not present

## 2023-02-26 DIAGNOSIS — M8580 Other specified disorders of bone density and structure, unspecified site: Secondary | ICD-10-CM

## 2023-02-26 DIAGNOSIS — Z124 Encounter for screening for malignant neoplasm of cervix: Secondary | ICD-10-CM | POA: Diagnosis not present

## 2023-02-26 DIAGNOSIS — K219 Gastro-esophageal reflux disease without esophagitis: Secondary | ICD-10-CM | POA: Diagnosis not present

## 2023-02-26 DIAGNOSIS — Z1331 Encounter for screening for depression: Secondary | ICD-10-CM | POA: Diagnosis not present

## 2023-02-26 DIAGNOSIS — J310 Chronic rhinitis: Secondary | ICD-10-CM | POA: Diagnosis not present

## 2023-02-26 DIAGNOSIS — Z01419 Encounter for gynecological examination (general) (routine) without abnormal findings: Secondary | ICD-10-CM | POA: Diagnosis not present

## 2023-02-26 MED ORDER — ALBUTEROL SULFATE HFA 108 (90 BASE) MCG/ACT IN AERS: 2.0000 | INHALATION_SPRAY | Freq: Four times a day (QID) | RESPIRATORY_TRACT | 1 refills | Status: DC | PRN

## 2023-02-26 MED ORDER — BREO ELLIPTA 200-25 MCG/ACT IN AEPB: 1.0000 | INHALATION_SPRAY | Freq: Every day | RESPIRATORY_TRACT | 1 refills | Status: DC

## 2023-02-26 MED ORDER — LORATADINE 10 MG PO TABS: 10.0000 mg | ORAL_TABLET | Freq: Every day | ORAL | 1 refills | Status: AC | PRN

## 2023-02-26 MED ORDER — AZELASTINE HCL 0.1 % NA SOLN: NASAL | 5 refills | Status: AC

## 2023-02-26 MED ORDER — OMEPRAZOLE 20 MG PO CPDR: 20.0000 mg | DELAYED_RELEASE_CAPSULE | Freq: Every day | ORAL | 1 refills | Status: AC

## 2023-02-26 MED ORDER — MONTELUKAST SODIUM 10 MG PO TABS: 10.0000 mg | ORAL_TABLET | Freq: Every day | ORAL | 1 refills | Status: AC

## 2023-02-26 NOTE — Patient Instructions (Addendum)
Moderate Persistent Asthma - Start Fasenra 30mg  subcutaneous every 4 weeks for 3 doses followed by 30mg  every 8 weeks.  - Maintenance inhaler: continue Breo 1 puff daily.  Continue Singulair 10mg  daily.  - Rescue inhaler: Albuterol 2 puffs via spacer or 1 vial via nebulizer every 4-6 hours as needed for respiratory symptoms of cough, shortness of breath, or wheezing Asthma control goals:  Full participation in all desired activities (may need albuterol before activity) Albuterol use two times or less a week on average (not counting use with activity) Cough interfering with sleep two times or less a month Oral steroids no more than once a year No hospitalizations  Chronic Rhinitis - sIgE 03/2021: negative - Use nasal saline rinses before nose sprays such as with Neilmed Sinus Rinse.  Use distilled water.   - Use Azelastine 2 sprays each nostril twice daily as needed for runny nose, drainage, sneezing, congestion. Aim upward and outward. - Use Claritin 10mg  daily as needed for runny nose, drainage, congestion.    GERD - Use Omeprazole 20 mg on an empty stomach in the morning. Eat a meal/snack about 30-45 minutes after.  -Avoid lying down for at least two hours after a meal or after drinking acidic beverages, like soda, or other caffeinated beverages. This can help to prevent stomach contents from flowing back into the esophagus. -Keep your head elevated while you sleep. Using an extra pillow or two can also help to prevent reflux. -Eat smaller and more frequent meals each day instead of a few large meals. This promotes digestion and can aid in preventing heartburn. -Wear loose-fitting clothes to ease pressure on the stomach, which can worsen heartburn and reflux. -Reduce excess weight around the midsection. This can ease pressure on the stomach. Such pressure can force some stomach contents back up the esophagus.

## 2023-02-26 NOTE — Progress Notes (Signed)
FOLLOW UP Date of Service/Encounter:  02/26/23   Subjective:  Angela [name] (DOB: 05-19-1955) is a 68 y.o. female [facility] for follow up for asthma, chronic rhinitis, GERD.   History obtained from: chart review and patient. Last seen 01/14/2023 and at the time was having trouble with frequent cough/wheezing on Breo/Singulair.  Reflux was also thought to be contributing so started on PPI.  On Azelastine for chronic rhinitis.  Has glaucoma.   Reports reflux is better. Has noted decrease in cough, sour taste in mouth, heartburn.  Taking Omeprazole daily.  Asthma Control Test: ACT Total Score: 16.    Still having trouble with asthma with frequent wheezing throughout the week.  Requires Albuterol about 4x/week.  Has had 1 or 2 courses of oral prednisone for flare ups in the last year.  Taking Breo and Singulair daily.  Coughing is a bit better but still occurs throughout the week.  Does get easily SOB also especially with activity.  Given information on Eliberto Ivory, Dupixent last visit; decided on Fasenra due to dosing interval.    Some stuffy nose and drainage with change in weather but nothing too bothersome.  Uses Azelastine prior to CPAP at night.  Rarely takes Claritin.   Past Medical History: Past Medical History:  Diagnosis Date   Anxiety    Asthma    Asthma due to seasonal allergies    Breast cancer (HCC) 07/2017   Left Breast Cancer   Breast discharge    Cataract    Mixed form OU   Congestion of right ear    Depression    GERD (gastroesophageal reflux disease)    Headache(784.0)    Hyperlipidemia    Hypothyroidism    Seizures (HCC)    hx of during childhood -not since age 77   Sleep apnea    uses CPAP   Thyroid disease    Tinnitus    Urticaria     Objective:  BP (!) 130/90 (BP Location: Right Arm, Patient Position: Sitting, Cuff Size: Large)   Pulse 84   Temp 98.3 F (36.8 C) (Temporal)   SpO2 97%  There  is no height or weight on file to calculate BMI. Physical Exam: GEN: alert, well developed HEENT: clear conjunctiva, nose with mild inferior turbinate hypertrophy, pink nasal mucosa, no rhinorrhea, + cobblestoning HEART: regular rate and rhythm, no murmur LUNGS: clear to auscultation bilaterally, + coughing, unlabored respiration SKIN: no rashes or lesions  Spirometry:  Tracings reviewed. Her effort: Good reproducible efforts. FVC: 2.67L, 98% predicted  FEV1: 2.10L, 99% predicted FEV1/FVC ratio: 79% Interpretation: Spirometry consistent with normal pattern.  Please see scanned spirometry results for details.  Assessment:   1. Chronic rhinitis   2. Gastroesophageal reflux disease, unspecified whether esophagitis present   3. Moderate persistent asthma without complication     Plan/Recommendations:  Moderate Persistent Asthma - Still uncontrolled with wheezing requiring frequent albuterol.  Has required 1-2 courses of oral prednisone in the last year.  AEC 200-400.  Start Fasenra 30mg  subcutaneous every 4 weeks for 3 doses followed by 30mg  every 8 weeks. Will message Tammy.  Unable to do triple inhaler therapy due to glaucoma. MDI technique discussed.  Spirometry today normal.   - Maintenance inhaler: continue Breo 1 puff daily.  Continue Singulair 10mg  daily.  - Rescue inhaler: Albuterol 2 puffs via spacer or 1 vial via nebulizer every 4-6 hours as needed for respiratory symptoms of cough, shortness  of breath, or wheezing Asthma control goals:  Full participation in all desired activities (may need albuterol before activity) Albuterol use two times or less a week on average (not counting use with activity) Cough interfering with sleep two times or less a month Oral steroids no more than once a year No hospitalizations  Chronic Rhinitis - Uncontrolled, can add Claritin with Azelastine.  - sIgE 03/2021: negative - Use nasal saline rinses before nose sprays such as with  Neilmed Sinus Rinse.  Use distilled water.   - Use Azelastine 2 sprays each nostril twice daily as needed for runny nose, drainage, sneezing, congestion. Aim upward and outward. - Use Claritin 10mg  daily as needed for runny nose, drainage, congestion.    GERD - Cough with some improvement.   - Use Omeprazole 20 mg on an empty stomach in the morning. Eat a meal/snack about 30-45 minutes after.  -Avoid lying down for at least two hours after a meal or after drinking acidic beverages, like soda, or other caffeinated beverages. This can help to prevent stomach contents from flowing back into the esophagus. -Keep your head elevated while you sleep. Using an extra pillow or two can also help to prevent reflux. -Eat smaller and more frequent meals each day instead of a few large meals. This promotes digestion and can aid in preventing heartburn. -Wear loose-fitting clothes to ease pressure on the stomach, which can worsen heartburn and reflux. -Reduce excess weight around the midsection. This can ease pressure on the stomach. Such pressure can force some stomach contents back up the esophagus.       Return in about 3 months (around 05/27/2023).  Alesia Morin, MD [facility]

## 2023-03-03 ENCOUNTER — Telehealth: Payer: Self-pay | Admitting: *Deleted

## 2023-03-03 NOTE — Telephone Encounter (Signed)
-----   Message from Birder Robson sent at 02/26/2023 11:59 AM EST ----- Gwenyth Bender I just saw Ms Pasch for asthma. Still having a lot of trouble with wheezing/cough, requiring Albuterol frequently throughout the week.  AEC 200-400.  Would like her to start [facility] please. Thank you.

## 2023-03-03 NOTE — Telephone Encounter (Signed)
Spoke to patient to get PBM info will reach out once approval is done for next step

## 2023-03-05 NOTE — Telephone Encounter (Signed)
Called patient and advised approval, copay card and submit to [facility]. Instructed patient on delivery, storage, dosing and initial injection in clinic with appt

## 2023-03-13 DIAGNOSIS — G4733 Obstructive sleep apnea (adult) (pediatric): Secondary | ICD-10-CM | POA: Diagnosis not present

## 2023-04-10 DIAGNOSIS — G4733 Obstructive sleep apnea (adult) (pediatric): Secondary | ICD-10-CM | POA: Diagnosis not present

## 2023-04-12 ENCOUNTER — Other Ambulatory Visit: Payer: Self-pay | Admitting: Family

## 2023-04-15 DIAGNOSIS — Z853 Personal history of malignant neoplasm of breast: Secondary | ICD-10-CM | POA: Insufficient documentation

## 2023-04-15 DIAGNOSIS — H409 Unspecified glaucoma: Secondary | ICD-10-CM | POA: Insufficient documentation

## 2023-04-15 DIAGNOSIS — H02421 Myogenic ptosis of right eyelid: Secondary | ICD-10-CM | POA: Insufficient documentation

## 2023-04-15 DIAGNOSIS — J45909 Unspecified asthma, uncomplicated: Secondary | ICD-10-CM | POA: Insufficient documentation

## 2023-04-16 ENCOUNTER — Ambulatory Visit

## 2023-04-24 ENCOUNTER — Ambulatory Visit

## 2023-04-24 DIAGNOSIS — J4550 Severe persistent asthma, uncomplicated: Secondary | ICD-10-CM

## 2023-04-24 MED ORDER — BENRALIZUMAB 30 MG/ML ~~LOC~~ SOSY
30.0000 mg | PREFILLED_SYRINGE | Freq: Once | SUBCUTANEOUS | Status: AC
  Administered 2023-04-24: 30 mg via SUBCUTANEOUS

## 2023-04-24 NOTE — Progress Notes (Signed)
 Immunotherapy   Patient Details  Name: Angela [name] MRN: [id number] Date of Birth: Mar 29, 1955  04/24/2023  Oleta Mouse started injections for Asthma. Patient was shown how to self administer, pt demonstrated correct administration and will continue at home. Patient received 30 mg Fasenra and waited 15 minutes with no problems. Patient was given the next 3 dates to continue self administration.   Frequency: every 4 weeks times 3 doses then every 8 weeks Consent signed and patient instructions given.   Dub Mikes 04/24/2023, 11:02 AM

## 2023-04-28 DIAGNOSIS — Z853 Personal history of malignant neoplasm of breast: Secondary | ICD-10-CM | POA: Diagnosis not present

## 2023-04-28 DIAGNOSIS — Z9012 Acquired absence of left breast and nipple: Secondary | ICD-10-CM | POA: Diagnosis not present

## 2023-05-11 DIAGNOSIS — G4733 Obstructive sleep apnea (adult) (pediatric): Secondary | ICD-10-CM | POA: Diagnosis not present

## 2023-05-28 ENCOUNTER — Encounter: Payer: Self-pay | Admitting: Emergency Medicine

## 2023-05-28 ENCOUNTER — Ambulatory Visit: Admission: EM | Admit: 2023-05-28 | Discharge: 2023-05-28 | Disposition: A | Discharge status: Home / Self Care

## 2023-05-28 DIAGNOSIS — G43819 Other migraine, intractable, without status migrainosus: Secondary | ICD-10-CM

## 2023-05-28 MED ORDER — KETOROLAC TROMETHAMINE 30 MG/ML IJ SOLN
30.0000 mg | Freq: Once | INTRAMUSCULAR | Status: AC
  Administered 2023-05-28: 30 mg via INTRAMUSCULAR

## 2023-05-28 MED ORDER — METOCLOPRAMIDE HCL 5 MG/ML IJ SOLN
5.0000 mg | Freq: Once | INTRAMUSCULAR | Status: AC
  Administered 2023-05-28: 5 mg via INTRAMUSCULAR

## 2023-05-28 MED ORDER — DEXAMETHASONE SODIUM PHOSPHATE 10 MG/ML IJ SOLN
10.0000 mg | Freq: Once | INTRAMUSCULAR | Status: AC
  Administered 2023-05-28: 10 mg via INTRAMUSCULAR

## 2023-05-28 NOTE — ED Provider Notes (Signed)
 [facility]    CSN: [id number] Arrival date & time: 05/28/23  0948      History   Chief Complaint Chief Complaint  Patient presents with   Headache    HPI Angela [name] is a 68 y.o. female.   Angela [name] is a 68 y.o. female presenting for chief complaint of Headache that started 4 days ago.  Headache started to the left side of the head and has now moved over to the right frontal region.  Pain radiates to the right posterior head/neck.  Describes pain as a throbbing sensation that is currently an 8 on a scale of 0-10.  Headache is worsened by bright lights and loud noises and improves when she is in a dark room.  Denies vision changes, dizziness, ear pain, neck pain, head injuries, memory changes, fever, chills, nausea, vomiting, abdominal pain, body aches, viral URI symptoms, and unilateral extremity weakness. History of migraine headaches without aura that have not affected her in "years".  Migraines have been well-controlled with changes in diet.  She will have headaches sometimes if she eats pork or certain preservatives.  Denies recent intake of known foods to trigger headache. She has been taking ibuprofen  and tylenol  with minimal relief over the last 4 days since headache onset.   BP elevated at 135/102. History of "spikes" in blood pressure without diagnosis of HTN in the past. States BP "spikes" when she is stressed or in pain and then returns to normal. Denies chest pain, shortness of breath, unilateral weakness, paresthesias, leg swelling, etc.    Headache   Past Medical History:  Diagnosis Date   Anxiety    Asthma    Asthma due to seasonal allergies    Breast cancer (HCC) 07/2017   Left Breast Cancer   Breast discharge    Cataract    Mixed form OU   Congestion of right ear    Depression    GERD (gastroesophageal reflux disease)    Headache(784.0)    Hyperlipidemia    Hypothyroidism    Seizures (HCC)    hx of during childhood -not since age  27   Sleep apnea    uses CPAP   Thyroid  disease    Tinnitus    Urticaria     Patient Active Problem List   Diagnosis Date Noted   Glaucoma 04/15/2023   History of malignant neoplasm of breast 04/15/2023   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   Asthma 04/15/2023   Chronic sinusitis 01/31/2020   Moderate persistent asthma 06/23/2019   Other allergic rhinitis 06/23/2019   Myringotomy tube status 09/08/2018   Otorrhea, right 09/08/2018   Acquired absence of left breast 01/13/2018   Breast cancer, left (HCC) 01/06/2018   Obesity (BMI 35.0-39.9 without comorbidity) 12/10/2017   Malignant neoplasm of lower-inner quadrant of left breast in female, estrogen receptor positive (HCC) 07/22/2017   Elevated BP without diagnosis of hypertension    Gastroesophageal reflux disease    Asthma exacerbation 05/13/2016   Anxiety 05/13/2016   Depression 05/13/2016   Chronic mucoid otitis media, right ear 11/08/2015   Conductive hearing loss in right ear 11/08/2015   Erosion of suburethral sling (HCC) 11/01/2014   Goiter, nodular 09/17/2012   Hypothyroidism 09/04/2012   Postop check 02/08/2011   Nipple discharge 12/18/2010   Right breast nipple inversion and discharge 11/06/2010    Past Surgical History:  Procedure Laterality Date   25 GAUGE PARS PLANA VITRECTOMY WITH 20 GAUGE MVR PORT FOR MACULAR  HOLE Right 04/29/2019   Procedure: 25 GAUGE PARS PLANA VITRECTOMY WITH 20 GAUGE MVR PORT FOR MACULAR HOLE;  Surgeon: Ronelle Coffee, MD;  Location: [facility] OR;  Service: Ophthalmology;  Laterality: Right;   ANTERIOR AND POSTERIOR REPAIR  2007   BREAST DUCTAL SYSTEM EXCISION  01/18/2011   Procedure: EXCISION DUCTAL SYSTEM BREAST;  Surgeon: Sherril Dole, MD;  Location: [facility];  Service: General;  Laterality: Right;   BREAST EXCISIONAL BIOPSY Right    BREAST RECONSTRUCTION WITH PLACEMENT OF TISSUE EXPANDER AND ALLODERM Left 01/06/2018   Procedure: BREAST RECONSTRUCTION WITH  PLACEMENT OF TISSUE EXPANDER AND ALLODERM;  Surgeon: Alger Infield, MD;  Location: [facility];  Service: Plastics;  Laterality: Left;   BREAST SURGERY  01/18/2011   Right BX   CYSTOSCOPY WITH URETHROLYSIS N/A 11/01/2014   Procedure: CYSTOSCOPY WITH URETHROLYSIS;  Surgeon: Erman Hayward, MD;  Location: [facility];  Service: Urology;  Laterality: N/A;   DILATION AND CURETTAGE OF UTERUS     MASTECTOMY Left 12/2017   PUBOVAGINAL SLING N/A 11/01/2014   Procedure: REMOVAL VAGINAL SLING AND URETHROLYSIS AND CYSTO;  Surgeon: Erman Hayward, MD;  Location: [facility];  Service: Urology;  Laterality: N/A;   REMOVAL OF TISSUE EXPANDER AND PLACEMENT OF IMPLANT Left 07/14/2018   Procedure: REMOVAL OF  LEFT TISSUE EXPANDER AND PLACEMENT OF IMPLANT;  Surgeon: Alger Infield, MD;  Location: [facility];  Service: Plastics;  Laterality: Left;   TUBAL LIGATION  1998   TYMPANOSTOMY TUBE PLACEMENT  2010   right ear    WISDOM TOOTH EXTRACTION      OB History     Gravida  6   Para  4   Term  4   Preterm      AB  2   Living  4      SAB  2   IAB      Ectopic      Multiple      Live Births               Home Medications    Prior to Admission medications   Medication Sig Start Date End Date Taking? Authorizing Provider  albuterol  (VENTOLIN  HFA) 108 (90 Base) MCG/ACT inhaler TAKE 2 PUFFS BY MOUTH EVERY 6 HOURS AS NEEDED FOR WHEEZE OR SHORTNESS OF BREATH 04/14/23  Yes Kandice Orleans, MD  BREO ELLIPTA  200-25 MCG/ACT AEPB TAKE 1 PUFF BY MOUTH EVERY DAY 02/26/23  Yes Kandice Orleans, MD  Difluprednate  0.05 % EMUL Place 1 drop into the right eye daily. 01/10/23  Yes [provider]  dorzolamide -timolol  (COSOPT ) 22.3-6.8 MG/ML ophthalmic solution Place 1 drop into the left eye 2 (two) times daily. 08/01/20  Yes Gudena, Vinay, MD  Ferrous Sulfate (IRON PO) Take by mouth. Take every other day   Yes [provider]  letrozole  (FEMARA ) 2.5 MG tablet  Take 1 tablet (2.5 mg total) by mouth daily. 12/18/22  Yes Gudena, Vinay, MD  levothyroxine  (SYNTHROID ) 112 MCG tablet Take by mouth. 03/12/23  Yes [provider]  levothyroxine  (SYNTHROID ) 125 MCG tablet TAKE 1 TABLET BY MOUTH IN THE MORNING ON AN EMPTY STOMACH ALTERNATING EVERY OTHER DAY   Yes [provider]  loratadine  (CLARITIN ) 10 MG tablet Take 1 tablet (10 mg total) by mouth daily as needed for allergies. 02/26/23  Yes Kandice Orleans, MD  montelukast  (SINGULAIR ) 10 MG tablet Take 1 tablet (10 mg total) by mouth at bedtime. 02/26/23  Yes Kandice Orleans, MD  omeprazole  (PRILOSEC) 20 MG capsule Take 1 capsule (20 mg total) by mouth daily. 02/26/23  Yes Kandice Orleans, MD  acetaminophen  (TYLENOL ) 500 MG tablet Take 1,000 mg by mouth every 6 (six) hours as needed for moderate pain or headache.    [provider]  albuterol  (PROVENTIL ) (2.5 MG/3ML) 0.083% nebulizer solution Take 3 mLs (2.5 mg total) by nebulization every 6 (six) hours as needed for wheezing or shortness of breath. 01/14/23   Tinnie Forehand, FNP  azelastine  (ASTELIN ) 0.1 % nasal spray 1-2 sprays each nostril 2 times daily as needed 02/26/23   Kandice Orleans, MD  diphenhydrAMINE  (BENADRYL ) 25 MG tablet Take 25 mg by mouth every 6 (six) hours as needed for itching or allergies.    [provider]  diphenhydrAMINE  HCl (BENADRYL  ALLERGY PO)     [provider]  FASENRA  PEN 30 MG/ML prefilled autoinjector     [provider]  fluticasone  furoate-vilanterol (BREO ELLIPTA ) 200-25 MCG/ACT AEPB Inhale 1 puff into the lungs daily.    [provider]  Multiple Vitamin (MULTIVITAMIN PO) Take 1 tablet by mouth daily at 2 PM.    [provider]  omeprazole  20 MG TBDD disintegrating tablet Take 1 tablet by mouth daily.    [provider]  triamcinolone  ointment (KENALOG ) 0.5 % Apply 1 application topically 2 (two) times daily. 01/31/20   Cameron Cea, MD    Family  History Family History  Problem Relation Age of Onset   Cancer Father        throat   Cancer Maternal Aunt        breast   Breast cancer Maternal Aunt        in 50's   Allergic rhinitis Neg Hx    Asthma Neg Hx    Eczema Neg Hx    Immunodeficiency Neg Hx    Urticaria Neg Hx     Social History Social History   Tobacco Use   Smoking status: Never   Smokeless tobacco: Never  Vaping Use   Vaping status: Never Used  Substance Use Topics   Alcohol use: No   Drug use: No     Allergies   Other, Aspirin, and Ibuprofen    Review of Systems Review of Systems  Neurological:  Positive for headaches.  Per HPI   Physical Exam Triage Vital Signs ED Triage Vitals [05/28/23 1055]  Encounter Vitals Group     BP (!) 135/102     Systolic BP Percentile      Diastolic BP Percentile      Pulse Rate 81     Resp 16     Temp 98.2 F (36.8 C)     Temp Source Oral     SpO2 96 %     Weight      Height      Head Circumference      Peak Flow      Pain Score 8     Pain Loc      Pain Education      Exclude from Growth Chart    No data found.  Updated Vital Signs BP (!) 135/102 (BP Location: Left Arm)   Pulse 81   Temp 98.2 F (36.8 C) (Oral)   Resp 16   SpO2 96%   Visual Acuity Right Eye Distance:   Left Eye Distance:   Bilateral Distance:    Right Eye Near:   Left Eye Near:  Bilateral Near:     Physical Exam Vitals and nursing note reviewed.  Constitutional:      Appearance: She is not ill-appearing or toxic-appearing.  HENT:     Head: Normocephalic and atraumatic.     Right Ear: Hearing, tympanic membrane, ear canal and external ear normal.     Left Ear: Hearing, tympanic membrane, ear canal and external ear normal.     Nose: Nose normal.     Mouth/Throat:     Lips: Pink.     Mouth: Mucous membranes are moist. No injury or oral lesions.     Dentition: Normal dentition.     Tongue: No lesions.     Pharynx: Oropharynx is clear. Uvula midline. No pharyngeal  swelling, oropharyngeal exudate, posterior oropharyngeal erythema, uvula swelling or postnasal drip.     Tonsils: No tonsillar exudate.  Eyes:     General: Lids are normal. Vision grossly intact. Gaze aligned appropriately.     Extraocular Movements: Extraocular movements intact.     Conjunctiva/sclera: Conjunctivae normal.  Neck:     Trachea: Trachea and phonation normal.  Cardiovascular:     Rate and Rhythm: Normal rate and regular rhythm.     Heart sounds: Normal heart sounds, S1 normal and S2 normal.  Pulmonary:     Effort: Pulmonary effort is normal. No respiratory distress.     Breath sounds: Normal breath sounds and air entry.  Musculoskeletal:     Cervical back: Neck supple.  Lymphadenopathy:     Cervical: No cervical adenopathy.  Skin:    General: Skin is warm and dry.     Capillary Refill: Capillary refill takes less than 2 seconds.     Findings: No rash.  Neurological:     General: No focal deficit present.     Mental Status: She is alert and oriented to person, place, and time. Mental status is at baseline.     GCS: GCS eye subscore is 4. GCS verbal subscore is 5. GCS motor subscore is 6.     Cranial Nerves: Cranial nerves 2-12 are intact. No dysarthria or facial asymmetry.     Sensory: Sensation is intact.     Motor: Motor function is intact. No weakness, tremor, abnormal muscle tone or pronator drift.     Coordination: Coordination is intact. Romberg sign negative. Coordination normal. Finger-Nose-Finger Test normal.     Gait: Gait is intact.     Comments: Strength and sensation intact to bilateral upper and lower extremities (5/5). Moves all 4 extremities with normal coordination voluntarily. Non-focal neuro exam.   Psychiatric:        Mood and Affect: Mood normal.        Speech: Speech normal.        Behavior: Behavior normal.        Thought Content: Thought content normal.        Judgment: Judgment normal.      UC Treatments / Results  Labs (all labs  ordered are listed, but only abnormal results are displayed) Labs Reviewed - No data to display  EKG   Radiology No results found.  Procedures Procedures (including critical care time)  Medications Ordered in UC Medications  ketorolac  (TORADOL ) 30 MG/ML injection 30 mg (30 mg Intramuscular Given 05/28/23 1134)  metoCLOPramide  (REGLAN ) injection 5 mg (5 mg Intramuscular Given 05/28/23 1134)  dexamethasone  (DECADRON ) injection 10 mg (10 mg Intramuscular Given 05/28/23 1134)    Initial Impression / Assessment and Plan / UC Course  I have reviewed  the triage vital signs and the nursing notes.  Pertinent labs & imaging results that were available during my care of the patient were reviewed by me and considered in my medical decision making (see chart for details).   1. Migraine Evaluation suggests migraine type headache. Neurologic exam without focal deficit, patient intact to baseline.  Low suspicion for temporal arteritis, acute intracranial abnormality.  Nursing administered headache cocktail in clinic for headache relief with improvement prior to discharge.  Pain level improved from 8/10 to 3/10 30 minutes after migraine cocktail administration prior to discharge. No NSAIDs until tomorrow due to medications given in clinic. May use OTC medications as needed for further head pain relief.  Encouraged to drink plenty of fluids to stay well hydrated. Avoid migraine triggers and keep headache diary.  Follow-up with PCP and neurologist for further evaluation of persistent headaches.  Counseled patient on potential for adverse effects with medications prescribed/recommended today, strict ER and return-to-clinic precautions discussed, patient verbalized understanding.    Final Clinical Impressions(s) / UC Diagnoses   Final diagnoses:  Other migraine without status migrainosus, intractable     Discharge Instructions      You have been evaluated today for headache.  You were  given medicines for your headache in the clinic today which included a strong NSAID, so do not  take ibuprofen  or other NSAIDS (Aleve, aspirin, naproxen, ibuprofen , goody powder, etc.) for the next 12 hours.  Starting tomorrow, take 600mg  ibuprofen  every 6 hours or tylenol  1,000 every 6 hours as needed for pain.  Avoid areas of loud noise/harsh light and remember to drink plenty of water  to stay well hydrated.  Please follow up with your primary care provider for further management of your headaches.  Please seek emergency medical care if you experience worsening or uncontrolled pain, vision changes, recurrent vomiting, difficulty with normal activities, abnormal behavior, difficulty walking, numbness, weakness, or any other concerning symptoms.      ED Prescriptions   None    PDMP not reviewed this encounter.   Starlene Eaton, [place] 05/28/23 1213

## 2023-05-28 NOTE — ED Triage Notes (Signed)
 Pt reports headache x4 days. Throbbing headache with radiation into neck has been fairly constant with brief breaks after ibuprofen  doses (wears off quickly). Pt reports hx of migraines, but it's been yrs since they bothered her. No vision changes, slight nausea intermittently per pt. Pt also noticed her BP has been elevated at home for the last few readings - specifically diastolic in the 100s. Denies dizziness, lightheadedness, or chest pain.

## 2023-05-28 NOTE — Discharge Instructions (Addendum)

## 2023-06-09 ENCOUNTER — Telehealth: Payer: Self-pay

## 2023-06-09 ENCOUNTER — Other Ambulatory Visit (HOSPITAL_COMMUNITY): Payer: Self-pay

## 2023-06-09 NOTE — Telephone Encounter (Signed)
 Pharmacy Patient Advocate Encounter   Received notification from CoverMyMeds that prior authorization for Breo Ellipta  200-25MCG/ACT aerosol powder  is required/requested.   Insurance verification completed.   The patient is insured through [employer] .   Per test claim: The current 30 day co-pay is, $387.76.  No PA needed at this time. This test claim was processed through [facility]- copay amounts may vary at other pharmacies due to pharmacy/plan contracts, or as the patient moves through the different stages of their insurance plan.

## 2023-06-10 DIAGNOSIS — G4733 Obstructive sleep apnea (adult) (pediatric): Secondary | ICD-10-CM | POA: Diagnosis not present

## 2023-06-15 ENCOUNTER — Other Ambulatory Visit: Payer: Self-pay | Admitting: Family

## 2023-07-08 ENCOUNTER — Other Ambulatory Visit: Payer: Self-pay | Admitting: Family Medicine

## 2023-07-08 ENCOUNTER — Telehealth: Payer: Self-pay | Admitting: Internal Medicine

## 2023-07-08 DIAGNOSIS — Z1231 Encounter for screening mammogram for malignant neoplasm of breast: Secondary | ICD-10-CM

## 2023-07-08 NOTE — Telephone Encounter (Signed)
 PT called to discuss rx of Fasenra  Pen, as [facility] needs approval from practice to continue issuing. She asked for call back to discuss

## 2023-07-10 NOTE — Telephone Encounter (Signed)
 L/m for patient to call me per [facility] looks like she is trying to fill too early should be every 8 weeks. Causing Ins to need qty overrride

## 2023-07-11 DIAGNOSIS — G4733 Obstructive sleep apnea (adult) (pediatric): Secondary | ICD-10-CM | POA: Diagnosis not present

## 2023-07-11 NOTE — Telephone Encounter (Signed)
 Spoke to patient and her Angela [name] ran out for laoding and she missed dose she is going to pick up sample pen in [place] and then her next injection will be august

## 2023-08-26 ENCOUNTER — Telehealth: Payer: Self-pay | Admitting: *Deleted

## 2023-08-26 NOTE — Telephone Encounter (Signed)
 L/m needs MD appt for Fasenra  approval due to last visit start

## 2023-09-01 ENCOUNTER — Other Ambulatory Visit: Payer: Self-pay | Admitting: Family

## 2023-09-01 DIAGNOSIS — H401113 Primary open-angle glaucoma, right eye, severe stage: Secondary | ICD-10-CM | POA: Diagnosis not present

## 2023-09-01 DIAGNOSIS — H2011 Chronic iridocyclitis, right eye: Secondary | ICD-10-CM | POA: Diagnosis not present

## 2023-09-01 DIAGNOSIS — H40022 Open angle with borderline findings, high risk, left eye: Secondary | ICD-10-CM | POA: Diagnosis not present

## 2023-09-01 DIAGNOSIS — H02421 Myogenic ptosis of right eyelid: Secondary | ICD-10-CM | POA: Diagnosis not present

## 2023-09-02 ENCOUNTER — Ambulatory Visit

## 2024-02-11 ENCOUNTER — Other Ambulatory Visit (HOSPITAL_BASED_OUTPATIENT_CLINIC_OR_DEPARTMENT_OTHER): Payer: Self-pay | Admitting: Obstetrics and Gynecology

## 2024-02-11 DIAGNOSIS — M8580 Other specified disorders of bone density and structure, unspecified site: Secondary | ICD-10-CM

## 2024-02-19 ENCOUNTER — Other Ambulatory Visit: Payer: Self-pay | Admitting: Hematology and Oncology

## 2024-02-19 ENCOUNTER — Telehealth: Payer: Self-pay

## 2024-02-19 NOTE — Telephone Encounter (Signed)
 Attempted to call pt because she needs an MD visit as she is overdue since October of 2025. We received a refill request for her Letrozole  and she needs an MD visit before processing. LVM for call back.

## 2024-02-19 NOTE — Telephone Encounter (Signed)
 Attempted to call pt x1 and LVM stating that she is overdue for a yearly f/u since 10/2023 and that she would need to be scheduled with Gudena MD in order for us  to refill her Letrozole .

## 2024-02-20 NOTE — Telephone Encounter (Signed)
 Pt needing f/u appt last seen in 2024

## 2024-02-23 ENCOUNTER — Ambulatory Visit: Admitting: Allergy

## 2024-03-01 ENCOUNTER — Ambulatory Visit: Admitting: Allergy

## 2024-03-08 ENCOUNTER — Ambulatory Visit: Admitting: Allergy

## 2024-04-20 ENCOUNTER — Other Ambulatory Visit (HOSPITAL_BASED_OUTPATIENT_CLINIC_OR_DEPARTMENT_OTHER)
